# Patient Record
Sex: Female | Born: 1935 | Race: White | Hispanic: No | Smoking: Never smoker
Health system: Southern US, Community
[De-identification: ages and names within clinical notes are randomized; demographics above are authoritative.]

## PROBLEM LIST (undated history)

## (undated) DIAGNOSIS — E079 Disorder of thyroid, unspecified: Secondary | ICD-10-CM

## (undated) DIAGNOSIS — E7800 Pure hypercholesterolemia, unspecified: Secondary | ICD-10-CM

## (undated) DIAGNOSIS — I10 Essential (primary) hypertension: Secondary | ICD-10-CM

## (undated) DIAGNOSIS — E119 Type 2 diabetes mellitus without complications: Secondary | ICD-10-CM

## (undated) HISTORY — PX: CATARACT EXTRACTION: SUR2

---

## 2015-09-22 DIAGNOSIS — E119 Type 2 diabetes mellitus without complications: Secondary | ICD-10-CM | POA: Insufficient documentation

## 2015-09-22 DIAGNOSIS — I10 Essential (primary) hypertension: Secondary | ICD-10-CM | POA: Diagnosis present

## 2015-09-22 DIAGNOSIS — K219 Gastro-esophageal reflux disease without esophagitis: Secondary | ICD-10-CM | POA: Insufficient documentation

## 2015-09-22 DIAGNOSIS — E039 Hypothyroidism, unspecified: Secondary | ICD-10-CM | POA: Insufficient documentation

## 2015-09-22 DIAGNOSIS — E782 Mixed hyperlipidemia: Secondary | ICD-10-CM | POA: Diagnosis present

## 2016-07-20 DIAGNOSIS — M792 Neuralgia and neuritis, unspecified: Secondary | ICD-10-CM | POA: Insufficient documentation

## 2016-11-20 DIAGNOSIS — H401131 Primary open-angle glaucoma, bilateral, mild stage: Secondary | ICD-10-CM | POA: Insufficient documentation

## 2016-11-29 DIAGNOSIS — H40053 Ocular hypertension, bilateral: Secondary | ICD-10-CM | POA: Insufficient documentation

## 2017-03-22 DIAGNOSIS — H524 Presbyopia: Secondary | ICD-10-CM | POA: Insufficient documentation

## 2017-03-22 DIAGNOSIS — Z961 Presence of intraocular lens: Secondary | ICD-10-CM | POA: Insufficient documentation

## 2017-11-23 DIAGNOSIS — M1612 Unilateral primary osteoarthritis, left hip: Secondary | ICD-10-CM | POA: Insufficient documentation

## 2018-06-20 DIAGNOSIS — R011 Cardiac murmur, unspecified: Secondary | ICD-10-CM | POA: Insufficient documentation

## 2019-01-12 ENCOUNTER — Inpatient Hospital Stay (HOSPITAL_BASED_OUTPATIENT_CLINIC_OR_DEPARTMENT_OTHER)
Admission: EM | Admit: 2019-01-12 | Discharge: 2019-02-11 | DRG: 335 | Disposition: A | Discharge status: Home Health | Payer: Medicare Other | Attending: Internal Medicine | Admitting: Internal Medicine

## 2019-01-12 ENCOUNTER — Encounter (HOSPITAL_BASED_OUTPATIENT_CLINIC_OR_DEPARTMENT_OTHER): Payer: Self-pay | Admitting: Emergency Medicine

## 2019-01-12 ENCOUNTER — Emergency Department (HOSPITAL_BASED_OUTPATIENT_CLINIC_OR_DEPARTMENT_OTHER): Payer: Medicare Other

## 2019-01-12 ENCOUNTER — Inpatient Hospital Stay (HOSPITAL_COMMUNITY): Payer: Medicare Other

## 2019-01-12 ENCOUNTER — Other Ambulatory Visit: Payer: Self-pay

## 2019-01-12 DIAGNOSIS — Z6823 Body mass index (BMI) 23.0-23.9, adult: Secondary | ICD-10-CM

## 2019-01-12 DIAGNOSIS — E43 Unspecified severe protein-calorie malnutrition: Secondary | ICD-10-CM | POA: Diagnosis present

## 2019-01-12 DIAGNOSIS — K560 Paralytic ileus: Secondary | ICD-10-CM | POA: Diagnosis present

## 2019-01-12 DIAGNOSIS — E039 Hypothyroidism, unspecified: Secondary | ICD-10-CM | POA: Diagnosis present

## 2019-01-12 DIAGNOSIS — K449 Diaphragmatic hernia without obstruction or gangrene: Secondary | ICD-10-CM | POA: Diagnosis present

## 2019-01-12 DIAGNOSIS — E785 Hyperlipidemia, unspecified: Secondary | ICD-10-CM | POA: Diagnosis not present

## 2019-01-12 DIAGNOSIS — I10 Essential (primary) hypertension: Secondary | ICD-10-CM | POA: Diagnosis present

## 2019-01-12 DIAGNOSIS — Z0189 Encounter for other specified special examinations: Secondary | ICD-10-CM

## 2019-01-12 DIAGNOSIS — Z978 Presence of other specified devices: Secondary | ICD-10-CM

## 2019-01-12 DIAGNOSIS — R188 Other ascites: Secondary | ICD-10-CM | POA: Diagnosis present

## 2019-01-12 DIAGNOSIS — K559 Vascular disorder of intestine, unspecified: Secondary | ICD-10-CM | POA: Diagnosis present

## 2019-01-12 DIAGNOSIS — K660 Peritoneal adhesions (postprocedural) (postinfection): Secondary | ICD-10-CM | POA: Diagnosis present

## 2019-01-12 DIAGNOSIS — E876 Hypokalemia: Secondary | ICD-10-CM | POA: Diagnosis present

## 2019-01-12 DIAGNOSIS — E1165 Type 2 diabetes mellitus with hyperglycemia: Secondary | ICD-10-CM | POA: Diagnosis present

## 2019-01-12 DIAGNOSIS — K567 Ileus, unspecified: Secondary | ICD-10-CM

## 2019-01-12 DIAGNOSIS — E1169 Type 2 diabetes mellitus with other specified complication: Secondary | ICD-10-CM | POA: Diagnosis not present

## 2019-01-12 DIAGNOSIS — Z79890 Hormone replacement therapy (postmenopausal): Secondary | ICD-10-CM | POA: Diagnosis not present

## 2019-01-12 DIAGNOSIS — K9189 Other postprocedural complications and disorders of digestive system: Secondary | ICD-10-CM | POA: Diagnosis not present

## 2019-01-12 DIAGNOSIS — Z1159 Encounter for screening for other viral diseases: Secondary | ICD-10-CM

## 2019-01-12 DIAGNOSIS — K56609 Unspecified intestinal obstruction, unspecified as to partial versus complete obstruction: Secondary | ICD-10-CM | POA: Diagnosis not present

## 2019-01-12 DIAGNOSIS — R109 Unspecified abdominal pain: Secondary | ICD-10-CM

## 2019-01-12 DIAGNOSIS — E119 Type 2 diabetes mellitus without complications: Secondary | ICD-10-CM | POA: Insufficient documentation

## 2019-01-12 DIAGNOSIS — K460 Unspecified abdominal hernia with obstruction, without gangrene: Secondary | ICD-10-CM | POA: Diagnosis present

## 2019-01-12 DIAGNOSIS — K5730 Diverticulosis of large intestine without perforation or abscess without bleeding: Secondary | ICD-10-CM | POA: Diagnosis present

## 2019-01-12 DIAGNOSIS — K219 Gastro-esophageal reflux disease without esophagitis: Secondary | ICD-10-CM | POA: Diagnosis present

## 2019-01-12 DIAGNOSIS — R1084 Generalized abdominal pain: Secondary | ICD-10-CM | POA: Diagnosis present

## 2019-01-12 DIAGNOSIS — Z888 Allergy status to other drugs, medicaments and biological substances status: Secondary | ICD-10-CM | POA: Diagnosis not present

## 2019-01-12 DIAGNOSIS — R1110 Vomiting, unspecified: Secondary | ICD-10-CM

## 2019-01-12 DIAGNOSIS — H9190 Unspecified hearing loss, unspecified ear: Secondary | ICD-10-CM | POA: Diagnosis present

## 2019-01-12 DIAGNOSIS — E782 Mixed hyperlipidemia: Secondary | ICD-10-CM | POA: Diagnosis present

## 2019-01-12 DIAGNOSIS — Z96642 Presence of left artificial hip joint: Secondary | ICD-10-CM | POA: Diagnosis present

## 2019-01-12 HISTORY — DX: Essential (primary) hypertension: I10

## 2019-01-12 HISTORY — DX: Pure hypercholesterolemia, unspecified: E78.00

## 2019-01-12 HISTORY — DX: Unspecified intestinal obstruction, unspecified as to partial versus complete obstruction: K56.609

## 2019-01-12 HISTORY — DX: Type 2 diabetes mellitus without complications: E11.9

## 2019-01-12 HISTORY — DX: Disorder of thyroid, unspecified: E07.9

## 2019-01-12 LAB — COMPREHENSIVE METABOLIC PANEL
ALT: 16 U/L (ref 0–44)
AST: 21 U/L (ref 15–41)
Albumin: 4.4 g/dL (ref 3.5–5.0)
Alkaline Phosphatase: 61 U/L (ref 38–126)
Anion gap: 14 (ref 5–15)
BUN: 26 mg/dL — ABNORMAL HIGH (ref 8–23)
CO2: 26 mmol/L (ref 22–32)
Calcium: 9.6 mg/dL (ref 8.9–10.3)
Chloride: 95 mmol/L — ABNORMAL LOW (ref 98–111)
Creatinine, Ser: 1.09 mg/dL — ABNORMAL HIGH (ref 0.44–1.00)
GFR calc Af Amer: 52 mL/min — ABNORMAL LOW (ref >60)
GFR calc non Af Amer: 45 mL/min — ABNORMAL LOW (ref >60)
Glucose, Bld: 230 mg/dL — ABNORMAL HIGH (ref 70–99)
Potassium: 3.2 mmol/L — ABNORMAL LOW (ref 3.5–5.1)
Sodium: 135 mmol/L (ref 135–145)
Total Bilirubin: 0.7 mg/dL (ref 0.3–1.2)
Total Protein: 7.5 g/dL (ref 6.5–8.1)

## 2019-01-12 LAB — SARS CORONAVIRUS 2 BY RT PCR (HOSPITAL ORDER, PERFORMED IN ~~LOC~~ HOSPITAL LAB): SARS Coronavirus 2: NEGATIVE

## 2019-01-12 LAB — URINALYSIS, ROUTINE W REFLEX MICROSCOPIC
Bilirubin Urine: NEGATIVE
Glucose, UA: NEGATIVE mg/dL
Ketones, ur: 15 mg/dL — AB
Leukocytes,Ua: NEGATIVE
Nitrite: NEGATIVE
Protein, ur: NEGATIVE mg/dL
Specific Gravity, Urine: 1.02 (ref 1.005–1.030)
pH: 7.5 (ref 5.0–8.0)

## 2019-01-12 LAB — CBC WITH DIFFERENTIAL/PLATELET
Abs Immature Granulocytes: 0.04 10*3/uL (ref 0.00–0.07)
Basophils Absolute: 0 10*3/uL (ref 0.0–0.1)
Basophils Relative: 0 %
Eosinophils Absolute: 0 10*3/uL (ref 0.0–0.5)
Eosinophils Relative: 0 %
HCT: 37.7 % (ref 36.0–46.0)
Hemoglobin: 11.5 g/dL — ABNORMAL LOW (ref 12.0–15.0)
Immature Granulocytes: 0 %
Lymphocytes Relative: 11 %
Lymphs Abs: 1.1 10*3/uL (ref 0.7–4.0)
MCH: 25.2 pg — ABNORMAL LOW (ref 26.0–34.0)
MCHC: 30.5 g/dL (ref 30.0–36.0)
MCV: 82.7 fL (ref 80.0–100.0)
Monocytes Absolute: 0.3 10*3/uL (ref 0.1–1.0)
Monocytes Relative: 4 %
Neutro Abs: 8.1 10*3/uL — ABNORMAL HIGH (ref 1.7–7.7)
Neutrophils Relative %: 85 %
Platelets: 349 10*3/uL (ref 150–400)
RBC: 4.56 MIL/uL (ref 3.87–5.11)
RDW: 15.1 % (ref 11.5–15.5)
WBC: 9.6 10*3/uL (ref 4.0–10.5)
nRBC: 0 % (ref 0.0–0.2)

## 2019-01-12 LAB — SARS CORONAVIRUS 2 AG (30 MIN TAT): SARS Coronavirus 2 Ag: NEGATIVE

## 2019-01-12 LAB — URINALYSIS, MICROSCOPIC (REFLEX)

## 2019-01-12 LAB — LIPASE, BLOOD: Lipase: 29 U/L (ref 11–51)

## 2019-01-12 LAB — TROPONIN I: Troponin I: <0.03 ng/mL (ref <0.03)

## 2019-01-12 MED ORDER — FENTANYL CITRATE (PF) 100 MCG/2ML IJ SOLN
25.0000 ug | INTRAMUSCULAR | Status: DC | PRN
  Administered 2019-01-12 – 2019-01-13 (×2): 25 ug via INTRAVENOUS
  Filled 2019-01-12 (×2): qty 2

## 2019-01-12 MED ORDER — HEPARIN SODIUM (PORCINE) 5000 UNIT/ML IJ SOLN
5000.0000 [IU] | Freq: Three times a day (TID) | INTRAMUSCULAR | Status: DC
  Administered 2019-01-12 – 2019-01-14 (×5): 5000 [IU] via SUBCUTANEOUS
  Filled 2019-01-12 (×4): qty 1

## 2019-01-12 MED ORDER — HYDRALAZINE HCL 20 MG/ML IJ SOLN: 10.0000 mg | Freq: Four times a day (QID) | INTRAMUSCULAR | Status: DC | PRN

## 2019-01-12 MED ORDER — MORPHINE SULFATE (PF) 2 MG/ML IV SOLN: 2.0000 mg | INTRAVENOUS | Status: DC | PRN

## 2019-01-12 MED ORDER — ONDANSETRON HCL 4 MG/2ML IJ SOLN
4.0000 mg | Freq: Once | INTRAMUSCULAR | Status: AC
  Administered 2019-01-12: 10:00:00 4 mg via INTRAVENOUS
  Filled 2019-01-12: qty 2

## 2019-01-12 MED ORDER — MORPHINE SULFATE (PF) 4 MG/ML IV SOLN
INTRAVENOUS | Status: AC
  Filled 2019-01-12: qty 1

## 2019-01-12 MED ORDER — SODIUM CHLORIDE 0.9 % IV BOLUS
500.0000 mL | Freq: Once | INTRAVENOUS | Status: AC
  Administered 2019-01-12: 500 mL via INTRAVENOUS

## 2019-01-12 MED ORDER — ONDANSETRON HCL 4 MG/2ML IJ SOLN: 4.0000 mg | Freq: Four times a day (QID) | INTRAMUSCULAR | Status: DC | PRN

## 2019-01-12 MED ORDER — IOHEXOL 300 MG/ML  SOLN
100.0000 mL | Freq: Once | INTRAMUSCULAR | Status: AC | PRN
  Administered 2019-01-12: 100 mL via INTRAVENOUS

## 2019-01-12 MED ORDER — DIATRIZOATE MEGLUMINE & SODIUM 66-10 % PO SOLN
90.0000 mL | Freq: Once | ORAL | Status: AC
  Administered 2019-01-12: 90 mL via NASOGASTRIC
  Filled 2019-01-12: qty 90

## 2019-01-12 MED ORDER — ONDANSETRON HCL 4 MG PO TABS: 4.0000 mg | ORAL_TABLET | Freq: Four times a day (QID) | ORAL | Status: DC | PRN

## 2019-01-12 MED ORDER — MORPHINE SULFATE (PF) 4 MG/ML IV SOLN
4.0000 mg | Freq: Once | INTRAVENOUS | Status: AC
  Administered 2019-01-12: 4 mg via INTRAVENOUS
  Filled 2019-01-12: qty 1

## 2019-01-12 MED ORDER — LIDOCAINE VISCOUS HCL 2 % MT SOLN
OROMUCOSAL | Status: AC
  Administered 2019-01-12: 14:00:00
  Filled 2019-01-12: qty 15

## 2019-01-12 MED ORDER — PANTOPRAZOLE SODIUM 40 MG IV SOLR
40.0000 mg | INTRAVENOUS | Status: DC
  Administered 2019-01-12 – 2019-01-20 (×9): 40 mg via INTRAVENOUS
  Filled 2019-01-12 (×9): qty 40

## 2019-01-12 MED ORDER — PHENOL 1.4 % MT LIQD
1.0000 | OROMUCOSAL | Status: DC | PRN
  Administered 2019-01-12: 1 via OROMUCOSAL
  Filled 2019-01-12: qty 177

## 2019-01-12 MED ORDER — DEXTROSE-NACL 5-0.45 % IV SOLN
INTRAVENOUS | Status: DC
  Administered 2019-01-12 – 2019-01-13 (×2): via INTRAVENOUS

## 2019-01-12 MED ORDER — MORPHINE SULFATE (PF) 4 MG/ML IV SOLN
4.0000 mg | Freq: Once | INTRAVENOUS | Status: AC
  Administered 2019-01-12: 4 mg via INTRAVENOUS

## 2019-01-12 MED ORDER — LEVOTHYROXINE SODIUM 100 MCG/5ML IV SOLN
25.0000 ug | Freq: Every day | INTRAVENOUS | Status: DC
  Administered 2019-01-12 – 2019-01-21 (×10): 25 ug via INTRAVENOUS
  Filled 2019-01-12 (×10): qty 5

## 2019-01-12 NOTE — H&P (Signed)
History and Physical        Hospital Admission Note Date: 01/12/2019  Patient name: Christie Hinton Medical record number: [id number] Date of birth: 01/31/1928 Age: 85 y.o. Gender: female  PCP: Sharren BridgeSellers, Billie H, NP    Patient coming from: Transferred [facility] [place]  I have reviewed all records in the [facility].    Chief Complaint:  Abdominal pain with nausea and vomiting  HPI: Patient is 85 year old female with history of hypertension, hyperlipidemia, GERD [facility] [place] with complaints of generalized abdominal pain that started after dinner last night, associated with vomiting.  Patient reported that at bedtime, patient started noticing lower abdominal pain, which progressively got worse, 8/10, sharp and intermittent with nausea and vomiting since midnight.  She denied any diarrhea.  Last BM was 2 days ago. No fevers, chills, chest pain or shortness of breath. [facility]19 [facility] at [facility] [place] negative  ED work-up/course:  Temp 97.7, pulse 76, BP 166/82, O2 sats 99% on room air At [facility] [place], patient desatted to 77% on room air after receiving IV morphine.  She was placed on 2 L O2 nasal cannula  CT abdomen pelvis showed small bowel obstruction.  Dilated loops of small bowel in the left abdomen with mesenteric edema and well-defined transition point.  Findings concerning for closed-loop obstruction possibly related to an internal hernia.  Colonic diverticulosis without acute colonic inflammation.  Review of Systems: Positives marked in 'bold' Constitutional: Denies fever, chills, diaphoresis, poor appetite and fatigue.  HEENT: Denies photophobia, eye pain, redness, hearing loss, ear pain, congestion, sore throat, rhinorrhea, sneezing, mouth sores, trouble swallowing, neck pain, neck stiffness and tinnitus.    Respiratory: Denies SOB, DOE, cough, chest tightness,  and wheezing.   Cardiovascular: Denies chest pain, palpitations and leg swelling.  Gastrointestinal: Please see HPI Genitourinary: Denies dysuria, urgency, frequency, hematuria, flank pain and difficulty urinating.  Musculoskeletal: Denies myalgias, back pain, joint swelling, arthralgias and gait problem.  Skin: Denies pallor, rash and wound.  Neurological: Denies dizziness, seizures, syncope, weakness, light-headedness, numbness and headaches.  Hematological: Denies adenopathy. Easy bruising, personal or family bleeding history  Psychiatric/Behavioral: Denies suicidal ideation, mood changes, confusion, nervousness, sleep disturbance and agitation  Past Medical History: Past Medical History:  Diagnosis Date  . Diabetes mellitus without complication (HCC)   . High cholesterol   . Hypertension   . Thyroid disease     Past Surgical History:  Procedure Laterality Date  . CATARACT EXTRACTION      Medications: Prior to Admission medications   Medication Sig Start Date End Date Taking? Authorizing Provider  atorvastatin (LIPITOR) 40 MG tablet Take 40 mg by mouth daily.   Yes [provider]  levothyroxine (SYNTHROID) 125 MCG tablet Take 50 mcg by mouth daily before breakfast.    Yes [provider]  losartan-hydrochlorothiazide (HYZAAR) 100-25 MG tablet Take 1 tablet by mouth daily.   Yes [provider]  omeprazole (PRILOSEC) 40 MG capsule Take 40 mg by mouth daily.   Yes [provider]    Allergies:  No Known Allergies  Social History:  reports that she has never smoked. She  has never used smokeless tobacco. No history on file for alcohol and drug.  Family History: Denies any pertinent family history of small bowel obstruction or cancer  Physical Exam: Blood pressure 120/71, pulse 79, temperature 97.7 F (36.5 C), temperature source Oral, resp. rate 17, height 5' 6.5" (1.689 m), weight  67.6 kg, SpO2 96 %. General: Alert, awake, oriented x3, in no acute distress. Eyes: pink conjunctiva,anicteric sclera, pupils equal and reactive to light and accomodation, HEENT: normocephalic, atraumatic, oropharynx clear Neck: supple, no masses or lymphadenopathy, no goiter, no bruits, no JVD CVS: Regular rate and rhythm, without murmurs, rubs or gallops. No lower extremity edema Resp : Clear to auscultation bilaterally, no wheezing, rales or rhonchi. GI : Soft, generalized abdominal TTP, hypoactive bowel sounds, no rebound or guarding Musculoskeletal: No clubbing or cyanosis, positive pedal pulses. No contracture. ROM intact  Neuro: Grossly intact, no focal neurological deficits, strength 5/5 upper and lower extremities bilaterally Psych: alert and oriented x 3, normal mood and affect Skin: no rashes or lesions, warm and dry   LABS on Admission: I have personally reviewed all the labs and imagings below    Basic Metabolic Panel: Recent Labs  Lab 01/12/19 0928  NA 135  K 3.2*  CL 95*  CO2 26  GLUCOSE 230*  BUN 26*  CREATININE 1.09*  CALCIUM 9.6   Liver Function Tests: Recent Labs  Lab 01/12/19 0928  AST 21  ALT 16  ALKPHOS 61  BILITOT 0.7  PROT 7.5  ALBUMIN 4.4   Recent Labs  Lab 01/12/19 0928  LIPASE 29   No results for input(s): AMMONIA in the last 168 hours. CBC: Recent Labs  Lab 01/12/19 0928  WBC 9.6  NEUTROABS 8.1*  HGB 11.5*  HCT 37.7  MCV 82.7  PLT 349   Cardiac Enzymes: Recent Labs  Lab 01/12/19 0928  TROPONINI <0.03   BNP: Invalid input(s): POCBNP CBG: No results for input(s): GLUCAP in the last 168 hours.  Radiological Exams on Admission:  Ct Abdomen Pelvis W Contrast  Result Date: 01/12/2019 CLINICAL DATA:  85 year old with bowel obstruction. Acute lower abdominal pain with nausea. EXAM: CT ABDOMEN AND PELVIS WITH CONTRAST TECHNIQUE: Multidetector CT imaging of the abdomen and pelvis was performed using the standard protocol  following bolus administration of intravenous contrast. CONTRAST:  132mL OMNIPAQUE IOHEXOL 300 MG/ML  SOLN COMPARISON:  None. FINDINGS: Lower chest: Lung bases are clear.  No pleural effusions. Hepatobiliary: Normal appearance of the liver. Small hypodensity in the right hepatic lobe on image 19 is probably an incidental finding. Portal venous system is patent. Normal appearance of the gallbladder without inflammatory changes. No biliary dilatation. Pancreas: Unremarkable. No pancreatic ductal dilatation or surrounding inflammatory changes. Spleen: Normal in size without focal abnormality. Adrenals/Urinary Tract: Normal adrenal glands. Urinary bladder is unremarkable but slightly limited evaluation due to artifact from left hip replacement. Normal appearance of both kidneys without hydronephrosis. Stomach/Bowel: Extensive diverticulosis in the sigmoid colon and left colon without acute colonic inflammation. Normal appendix. Moderate sized hiatal hernia. Normal appearance of the duodenum. Dilated small bowel loops in the left abdomen compatible with jejunum. There is mesenteric edema associated with these dilated small bowel loops. Small bowel loops measure up to 3.3 cm. Distal small bowel is decompressed. Small bowel transition point in the left abdomen near the mesenteric edema. Transition point best seen on the coronal reformats, sequence 5 image 42 and sagittal reformats, sequence 6, image 84. Irregularity of the mesentery and fat near the bowel obstruction and  raises concern for a closed loop obstruction and possibly an internal hernia. No evidence for pneumatosis or portal air. Vascular/Lymphatic: Atherosclerotic disease in the abdominal aorta without aneurysm. No abdominopelvic lymphadenopathy. Reproductive: Uterus and bilateral adnexa are unremarkable. Other: Small amount of free fluid in the pelvis. Minimal fluid in the left paracolic gutter. Mesenteric edema in the left abdomen associated with the dilated  small bowel loops. Negative for free air. Musculoskeletal: No acute bone abnormality in the spine. Extensive facet arthropathy. IMPRESSION: 1. Small bowel obstruction. Dilated loops of small bowel in the left abdomen with mesenteric edema and well-defined transition point. Findings raise concern for a closed loop obstruction, possibly related to an internal hernia. Small amount of fluid in the abdomen and pelvis. 2. Colonic diverticulosis without acute colonic inflammation. 3. Moderate sized hiatal hernia. These results were called by telephone at the time of interpretation on 01/12/2019 at 12:08 pm to Dr. Alvira MondayERIN SCHLOSSMAN , who verbally acknowledged these results. Electronically Signed   By: Richarda OverlieAdam  Henn M.D.   On: 01/12/2019 12:16      EKG: Independently reviewed.  No new EKG available   Assessment/Plan Principal Problem:   SBO (small bowel obstruction) (HCC): Per patient no prior history of small bowel obstruction -Admit to MedSurg, placed on n.p.o. status, IV fluids, pain control -Will avoid morphine as patient had respiratory depression and hypoxia at the [facility] [place].  Placed on low-dose fentanyl IV as needed. -NG-tube to intermittent wall suction-placed -General surgery consulted, Dr. Magnus IvanBlackman to evaluate   Active Problems:   HTN (hypertension) -Hold losartan HCTZ -Patient is n.p.o., placed on IV hydralazine with parameters    Hyperlipidemia -Currently n.p.o., hold Lipitor   Hypothyroidism -Converted to IV Synthroid 25 MCG, pharmacy to verify outpatient dose   DVT prophylaxis: Heparin subcu  CODE STATUS: Full CODE STATUS, discussed with the patient  Consults called: General surgery, discussed with Dr. Magnus IvanBlackman  Family Communication: Admission, patients condition and plan of care including tests being ordered have been discussed with the patient, who indicates understanding and agree with the plan and Code Status  Admission status: Patient, MedSurg  Disposition  plan: Further plan will depend as patient's clinical course evolves and further radiologic and laboratory data become available.    At the time of admission, it appears that the appropriate admission status for this patient is INPATIENT . This is judged to be reasonable and necessary in order to provide the required intensity of service to ensure the patient's safety given the presenting symptoms, physical exam findings, and initial radiographic and laboratory data in the context of their chronic comorbidities.  The medical decision making on this patient was of high complexity and the patient is at high risk for clinical deterioration, therefore this is a level 3 visit.   Time Spent on Admission: 60 minutes    Hadiyah Maricle M.D. [facility] 01/12/2019, 4:41 PM

## 2019-01-12 NOTE — Consult Note (Signed)
Reason for Consult:SBO Referring Physician: Dr. Thad Rangeripudeep Rai  [name] is an 85 y.o. female.  HPI: This is a very pleasant 85 year old female [facility] [place] with a small bowel obstruction.  She started having abdominal pain last evening after dinner.  She then started having nausea with emesis.  She described this pain is moderate to severe with cramping.  Her last bowel movement was 2 days ago.  She denies diarrhea.  She now has a nasogastric tube in place and reports much less discomfort now.  She has not passed flatus today.  She has no previous surgical history.  She had undergone a CT scan of the abdomen and pelvis suggesting a closed-loop small bowel obstruction.  She is otherwise without complaints.  She has no fevers or chills.  She denies dysuria.  Past Medical History:  Diagnosis Date  . Diabetes mellitus without complication (HCC)   . High cholesterol   . Hypertension   . Thyroid disease     Past Surgical History:  Procedure Laterality Date  . CATARACT EXTRACTION      No family history on file.  Social History:  reports that she has never smoked. She has never used smokeless tobacco. No history on file for alcohol and drug.  Allergies: No Known Allergies  Medications: I have reviewed the patient's current medications.  Results for orders placed or performed during the hospital encounter of 01/12/19 (from the past 48 hour(s))  CBC with Differential     Status: Abnormal   Collection Time: 01/12/19  9:28 AM  Result Value Ref Range   WBC 9.6 4.0 - 10.5 K/uL   RBC 4.56 3.87 - 5.11 MIL/uL   Hemoglobin 11.5 (L) 12.0 - 15.0 g/dL   HCT 16.137.7 09.636.0 - 04.546.0 %   MCV 82.7 80.0 - 100.0 fL   MCH 25.2 (L) 26.0 - 34.0 pg   MCHC 30.5 30.0 - 36.0 g/dL   RDW 40.915.1 81.111.5 - 91.415.5 %   Platelets 349 150 - 400 K/uL   nRBC 0.0 0.0 - 0.2 %   Neutrophils Relative % 85 %   Neutro Abs 8.1 (H) 1.7 - 7.7 K/uL   Lymphocytes Relative 11 %   Lymphs Abs 1.1 0.7 - 4.0 K/uL    Monocytes Relative 4 %   Monocytes Absolute 0.3 0.1 - 1.0 K/uL   Eosinophils Relative 0 %   Eosinophils Absolute 0.0 0.0 - 0.5 K/uL   Basophils Relative 0 %   Basophils Absolute 0.0 0.0 - 0.1 K/uL   Immature Granulocytes 0 %   Abs Immature Granulocytes 0.04 0.00 - 0.07 K/uL    Comment: Performed at [facility] [place], [address] [place], [place] [address]  Comprehensive metabolic panel     Status: Abnormal   Collection Time: 01/12/19  9:28 AM  Result Value Ref Range   Sodium 135 135 - 145 mmol/L   Potassium 3.2 (L) 3.5 - 5.1 mmol/L   Chloride 95 (L) 98 - 111 mmol/L   CO2 26 22 - 32 mmol/L   Glucose, Bld 230 (H) 70 - 99 mg/dL   BUN 26 (H) 8 - 23 mg/dL   Creatinine, Ser 6.211.09 (H) 0.44 - 1.00 mg/dL   Calcium 9.6 8.9 - 30.810.3 mg/dL   Total Protein 7.5 6.5 - 8.1 g/dL   Albumin 4.4 3.5 - 5.0 g/dL   AST 21 15 - 41 U/L   ALT 16 0 - 44 U/L   Alkaline Phosphatase 61  38 - 126 U/L   Total Bilirubin 0.7 0.3 - 1.2 mg/dL   GFR calc non Af Amer 45 (L) >60 mL/min   GFR calc Af Amer 52 (L) >60 mL/min   Anion gap 14 5 - 15    Comment: Performed at [facility] [place], [address] [place], [place] [address]  Lipase, blood     Status: None   Collection Time: 01/12/19  9:28 AM  Result Value Ref Range   Lipase 29 11 - 51 U/L    Comment: Performed at [facility] [place], [address] [place], [place] [address]  Troponin I - ONCE - STAT     Status: None   Collection Time: 01/12/19  9:28 AM  Result Value Ref Range   Troponin I <0.03 <0.03 ng/mL    Comment: Performed at [facility] [place], [address] [place], [place] [address]  Urinalysis, Routine w reflex microscopic     Status: Abnormal   Collection Time: 01/12/19  9:31 AM  Result Value Ref Range   Color, Urine YELLOW YELLOW   APPearance HAZY (A) CLEAR   Specific Gravity, Urine 1.020 1.005 - 1.030   pH 7.5 5.0 - 8.0   Glucose, UA NEGATIVE NEGATIVE mg/dL   Hgb urine dipstick TRACE (A) NEGATIVE   Bilirubin Urine  NEGATIVE NEGATIVE   Ketones, ur 15 (A) NEGATIVE mg/dL   Protein, ur NEGATIVE NEGATIVE mg/dL   Nitrite NEGATIVE NEGATIVE   Leukocytes,Ua NEGATIVE NEGATIVE    Comment: Performed at [facility] [place], [address] [place], [place] [address]  Urinalysis, Microscopic (reflex)     Status: Abnormal   Collection Time: 01/12/19  9:31 AM  Result Value Ref Range   RBC / HPF 11-20 0 - 5 RBC/hpf   WBC, UA 6-10 0 - 5 WBC/hpf   Bacteria, UA MANY (A) NONE SEEN   Squamous Epithelial / LPF 0-5 0 - 5   Mucus PRESENT     Comment: Performed at [facility] [place], [address] [place], [place] [address]  SARS Coronavirus 2 (Hosp order,Performed in [facility] lab via [employer])     Status: None   Collection Time: 01/12/19 11:21 AM  Result Value Ref Range   SARS Coronavirus 2 ([employer] Now) NEGATIVE NEGATIVE    Comment: (NOTE) Interpretive Result Comment(s): COVID 19 Positive SARS CoV 2 target nucleic acids are DETECTED. The SARS CoV 2 RNA is generally detectable in upper and lower respiratory specimens during the acute phase of infection.  Positive results are indicative of active infection with SARS CoV 2.  Clinical correlation with patient history and other diagnostic information is necessary to determine patient infection status.  Positive results do not rule out bacterial infection or coinfection with other viruses. The expected result is Negative. COVID 19 Negative SARS CoV 2 target nucleic acids are NOT DETECTED. The SARS CoV 2 RNA is generally detectable in upper and lower respiratory specimens during the acute phase of infection.  Negative results do not preclude SARS CoV 2 infection, do not rule out coinfections with other pathogens, and should not be used as the sole basis for treatment or other patient management decisions.  Negative results must be combined with clinical  observations, patient history, and epidemiological information. The expected result is  Negative. Invalid Presence or absence of SARS CoV 2 nucleic acids cannot be determined. Repeat testing was performed on the submitted specimen and repeated Invalid results  were obtained.  If clinically indicated, additional testing on a new specimen with an alternate test methodology [id number]) is advised.  The SARS CoV 2 RNA is generally detectable in upper and lower respiratory specimens during the acute phase of infection. The expected result is Negative. Fact Sheet for Patients:  http://www.graves-ford.org/https://www.fda.gov/media/136524/download Fact Sheet for Healthcare Providers: EnviroConcern.sihttps://www.fda.gov/media/136523/download This test is not yet approved or cleared by the [place] FDA and has been authorized for detection and/or diagnosis of SARS CoV 2 by FDA under an Emergency Use Authorization (EUA).  This EUA will remain in effect (meaning this test can be used) for the duration of the COVID19 d eclaration under Section 564(b)(1) of the Act, 21 U.S.C. section 8544251512360bbb 3(b)(1), unless the authorization is terminated or revoked sooner. Performed at [facility] [place], [address] [place], [place] [address]     Ct Abdomen Pelvis W Contrast  Result Date: 01/12/2019 CLINICAL DATA:  85 year old with bowel obstruction. Acute lower abdominal pain with nausea. EXAM: CT ABDOMEN AND PELVIS WITH CONTRAST TECHNIQUE: Multidetector CT imaging of the abdomen and pelvis was performed using the standard protocol following bolus administration of intravenous contrast. CONTRAST:  100mL OMNIPAQUE IOHEXOL 300 MG/ML  SOLN COMPARISON:  None. FINDINGS: Lower chest: Lung bases are clear.  No pleural effusions. Hepatobiliary: Normal appearance of the liver. Small hypodensity in the right hepatic lobe on image 19 is probably an incidental finding. Portal venous system is patent. Normal appearance of the gallbladder without inflammatory changes. No biliary dilatation. Pancreas: Unremarkable. No pancreatic ductal dilatation or  surrounding inflammatory changes. Spleen: Normal in size without focal abnormality. Adrenals/Urinary Tract: Normal adrenal glands. Urinary bladder is unremarkable but slightly limited evaluation due to artifact from left hip replacement. Normal appearance of both kidneys without hydronephrosis. Stomach/Bowel: Extensive diverticulosis in the sigmoid colon and left colon without acute colonic inflammation. Normal appendix. Moderate sized hiatal hernia. Normal appearance of the duodenum. Dilated small bowel loops in the left abdomen compatible with jejunum. There is mesenteric edema associated with these dilated small bowel loops. Small bowel loops measure up to 3.3 cm. Distal small bowel is decompressed. Small bowel transition point in the left abdomen near the mesenteric edema. Transition point best seen on the coronal reformats, sequence 5 image 42 and sagittal reformats, sequence 6, image 84. Irregularity of the mesentery and fat near the bowel obstruction and raises concern for a closed loop obstruction and possibly an internal hernia. No evidence for pneumatosis or portal air. Vascular/Lymphatic: Atherosclerotic disease in the abdominal aorta without aneurysm. No abdominopelvic lymphadenopathy. Reproductive: Uterus and bilateral adnexa are unremarkable. Other: Small amount of free fluid in the pelvis. Minimal fluid in the left paracolic gutter. Mesenteric edema in the left abdomen associated with the dilated small bowel loops. Negative for free air. Musculoskeletal: No acute bone abnormality in the spine. Extensive facet arthropathy. IMPRESSION: 1. Small bowel obstruction. Dilated loops of small bowel in the left abdomen with mesenteric edema and well-defined transition point. Findings raise concern for a closed loop obstruction, possibly related to an internal hernia. Small amount of fluid in the abdomen and pelvis. 2. Colonic diverticulosis without acute colonic inflammation. 3. Moderate sized hiatal hernia.  These results were called by telephone at the time of interpretation on 01/12/2019 at 12:08 pm to Dr. Alvira MondayERIN SCHLOSSMAN , who verbally acknowledged these results. Electronically Signed   By: Richarda OverlieAdam  Henn M.D.   On: 01/12/2019 12:16    Review of Systems  Constitutional: Negative for chills and fever.  Respiratory:  Negative for cough, sputum production and shortness of breath.   Cardiovascular: Negative for chest pain.  Gastrointestinal: Positive for abdominal pain, nausea and vomiting. Negative for constipation and diarrhea.  Genitourinary: Negative for dysuria and urgency.  All other systems reviewed and are negative.  Blood pressure 128/74, pulse 81, temperature (!) 97.5 F (36.4 C), temperature source Oral, resp. rate 12, height 5' 6.5" (1.689 m), weight 67.6 kg, SpO2 94 %. Physical Exam  Constitutional: She is oriented to person, place, and time. She appears well-developed and well-nourished. No distress.  HENT:  Head: Normocephalic and atraumatic.  Right Ear: External ear normal.  Left Ear: External ear normal.  Nose: Nose normal.  Mouth/Throat: Oropharynx is clear and moist. No oropharyngeal exudate.  Eyes: Pupils are equal, round, and reactive to light. Right eye exhibits no discharge. Left eye exhibits no discharge. No scleral icterus.  Neck: Normal range of motion. Neck supple. No tracheal deviation present.  Cardiovascular: Normal rate, regular rhythm, normal heart sounds and intact distal pulses.  No murmur heard. Respiratory: Effort normal and breath sounds normal. No respiratory distress. She has no wheezes.  GI: Soft. She exhibits no distension.  There is minimal tenderness in the left side of the abdomen.  There is minimal guarding.  There is no frank peritonitis. There are no hernias.  Musculoskeletal: Normal range of motion.        General: No tenderness, deformity or edema.  Lymphadenopathy:    She has no cervical adenopathy.  Neurological: She is alert and oriented to  person, place, and time.  Skin: Skin is warm and dry. She is not diaphoretic. No erythema. No pallor.  Psychiatric: Her behavior is normal. Judgment normal.    Assessment/Plan: Small bowel obstruction  Her CT scan is worrisome for a closed-loop small bowel obstruction but clinically, she is feeling better with decompression.  She has no prior surgical history so adhesive disease is rare in the circumstance.  I cannot completely rule out that this is malignant in nature.  She currently does not have peritonitis and her abdomen is surprisingly soft with only mild tenderness.  I discussed proceeding to the operating room with her for surgery versus an attempted conservative management with nasogastric suctioning.  Given her exam and normal white blood count, I believe is reasonable to continue bowel rest with nasogastric suctioning.  I will order our small bowel protocol as well.  If this is positive, she would eventually require laparotomy or if her clinical condition changes.  I will try to call her son.  She is in agreement with the plan.  We will follow her closely.  Coralie Keens 01/12/2019, 5:26 PM

## 2019-01-12 NOTE — ED Provider Notes (Signed)
[facility] EMERGENCY DEPARTMENT Provider Note   CSN: [id number] Arrival date & time: 01/12/19  6789    History   Chief Complaint Chief Complaint  Patient presents with  . Abdominal Pain    HPI Christie Hinton is a 85 y.o. female.     HPI   Last night woke up with pain across the stomach. Had severe nausea, a few episodes of vomiting. No diarrhea. Has chronic constipation.  Is passing flatus. No urinary symptoms. No fever.  No known sick contacts. No cough, dyspnea or cp. Not had this before. 8-9/10   Past Medical History:  Diagnosis Date  . Diabetes mellitus without complication ([place])   . High cholesterol   . Hypertension   . Thyroid disease     Patient Active Problem List   Diagnosis Date Noted  . SBO (small bowel obstruction) ([place]) 01/12/2019  . HTN (hypertension) 01/12/2019  . Hyperlipidemia 01/12/2019    Past Surgical History:  Procedure Laterality Date  . CATARACT EXTRACTION       OB History   No obstetric history on file.      Home Medications    Prior to Admission medications   Medication Sig Start Date End Date Taking? Authorizing Provider  atorvastatin (LIPITOR) 20 MG tablet Take 20 mg by mouth daily. 11/14/18   [provider]  latanoprost (XALATAN) 0.005 % ophthalmic solution Place 1 drop into both eyes at bedtime. 11/11/18   [provider]  levothyroxine (SYNTHROID) 50 MCG tablet Take 50 mcg by mouth daily. 11/14/18   [provider]  losartan-hydrochlorothiazide (HYZAAR) 100-25 MG tablet Take 1 tablet by mouth daily.    [provider]  omeprazole (PRILOSEC) 40 MG capsule Take 40 mg by mouth daily.    [provider]    Family History No family history on file.  Social History Social History   Tobacco Use  . Smoking status: Never Smoker  . Smokeless tobacco: Never Used  Substance Use Topics  . Alcohol use: Not on file  . Drug use: Not on file     Allergies   Piroxicam   Review  of Systems Review of Systems  Constitutional: Negative for fever.  HENT: Negative for sore throat.   Eyes: Negative for visual disturbance.  Respiratory: Negative for cough and shortness of breath.   Cardiovascular: Negative for chest pain.  Gastrointestinal: Positive for abdominal pain, constipation, nausea and vomiting. Negative for diarrhea.  Genitourinary: Negative for difficulty urinating and dysuria.  Musculoskeletal: Negative for back pain and neck pain.  Skin: Negative for rash.  Neurological: Negative for syncope and headaches.     Physical Exam Updated Vital Signs BP 136/73 (BP Location: Right Arm)   Pulse 80   Temp 98 F (36.7 C) (Oral)   Resp 15   Ht 5' 6.5" (1.689 m)   Wt 67.6 kg   SpO2 94%   BMI 23.69 kg/m   Physical Exam Vitals signs and nursing note reviewed.  Constitutional:      General: She is not in acute distress.    Appearance: She is well-developed. She is not diaphoretic.  HENT:     Head: Normocephalic and atraumatic.  Eyes:     Conjunctiva/sclera: Conjunctivae normal.  Neck:     Musculoskeletal: Normal range of motion.  Cardiovascular:     Rate and Rhythm: Normal rate and regular rhythm.     Heart sounds: Normal heart sounds. No murmur. No friction rub. No gallop.   Pulmonary:  Effort: Pulmonary effort is normal. No respiratory distress.     Breath sounds: Normal breath sounds. No wheezing or rales.  Abdominal:     General: There is no distension.     Palpations: Abdomen is soft.     Tenderness: There is generalized abdominal tenderness. There is no guarding.  Musculoskeletal:        General: No tenderness.  Skin:    General: Skin is warm and dry.     Findings: No erythema or rash.  Neurological:     Mental Status: She is alert and oriented to person, place, and time.      ED Treatments / Results  Labs (all labs ordered are listed, but only abnormal results are displayed) Labs Reviewed  CBC WITH DIFFERENTIAL/PLATELET -  Abnormal; Notable for the following components:      Result Value   Hemoglobin 11.5 (*)    MCH 25.2 (*)    Neutro Abs 8.1 (*)    All other components within normal limits  COMPREHENSIVE METABOLIC PANEL - Abnormal; Notable for the following components:   Potassium 3.2 (*)    Chloride 95 (*)    Glucose, Bld 230 (*)    BUN 26 (*)    Creatinine, Ser 1.09 (*)    GFR calc non Af Amer 45 (*)    GFR calc Af Amer 52 (*)    All other components within normal limits  URINALYSIS, ROUTINE W REFLEX MICROSCOPIC - Abnormal; Notable for the following components:   APPearance HAZY (*)    Hgb urine dipstick TRACE (*)    Ketones, ur 15 (*)    All other components within normal limits  URINALYSIS, MICROSCOPIC (REFLEX) - Abnormal; Notable for the following components:   Bacteria, UA MANY (*)    All other components within normal limits  SARS CORONAVIRUS 2 (HOSP ORDER, PERFORMED IN [facility] VIA ABBOTT ID)  SARS CORONAVIRUS 2 (HOSPITAL ORDER, PERFORMED IN [facility])  URINE CULTURE  LIPASE, BLOOD  TROPONIN I  BASIC METABOLIC PANEL  CBC  TSH    EKG None  Radiology Ct Abdomen Pelvis W Contrast  Result Date: 01/12/2019 CLINICAL DATA:  85 year old with bowel obstruction. Acute lower abdominal pain with nausea. EXAM: CT ABDOMEN AND PELVIS WITH CONTRAST TECHNIQUE: Multidetector CT imaging of the abdomen and pelvis was performed using the standard protocol following bolus administration of intravenous contrast. CONTRAST:  100mL OMNIPAQUE IOHEXOL 300 MG/ML  SOLN COMPARISON:  None. FINDINGS: Lower chest: Lung bases are clear.  No pleural effusions. Hepatobiliary: Normal appearance of the liver. Small hypodensity in the right hepatic lobe on image 19 is probably an incidental finding. Portal venous system is patent. Normal appearance of the gallbladder without inflammatory changes. No biliary dilatation. Pancreas: Unremarkable. No pancreatic ductal dilatation or surrounding inflammatory  changes. Spleen: Normal in size without focal abnormality. Adrenals/Urinary Tract: Normal adrenal glands. Urinary bladder is unremarkable but slightly limited evaluation due to artifact from left hip replacement. Normal appearance of both kidneys without hydronephrosis. Stomach/Bowel: Extensive diverticulosis in the sigmoid colon and left colon without acute colonic inflammation. Normal appendix. Moderate sized hiatal hernia. Normal appearance of the duodenum. Dilated small bowel loops in the left abdomen compatible with jejunum. There is mesenteric edema associated with these dilated small bowel loops. Small bowel loops measure up to 3.3 cm. Distal small bowel is decompressed. Small bowel transition point in the left abdomen near the mesenteric edema. Transition point best seen on the coronal reformats, sequence 5 image  42 and sagittal reformats, sequence 6, image 84. Irregularity of the mesentery and fat near the bowel obstruction and raises concern for a closed loop obstruction and possibly an internal hernia. No evidence for pneumatosis or portal air. Vascular/Lymphatic: Atherosclerotic disease in the abdominal aorta without aneurysm. No abdominopelvic lymphadenopathy. Reproductive: Uterus and bilateral adnexa are unremarkable. Other: Small amount of free fluid in the pelvis. Minimal fluid in the left paracolic gutter. Mesenteric edema in the left abdomen associated with the dilated small bowel loops. Negative for free air. Musculoskeletal: No acute bone abnormality in the spine. Extensive facet arthropathy. IMPRESSION: 1. Small bowel obstruction. Dilated loops of small bowel in the left abdomen with mesenteric edema and well-defined transition point. Findings raise concern for a closed loop obstruction, possibly related to an internal hernia. Small amount of fluid in the abdomen and pelvis. 2. Colonic diverticulosis without acute colonic inflammation. 3. Moderate sized hiatal hernia. These results were called  by telephone at the time of interpretation on 01/12/2019 at 12:08 pm to Dr. Alvira MondayERIN Josede Cicero , who verbally acknowledged these results. Electronically Signed   By: Richarda OverlieAdam  Henn M.D.   On: 01/12/2019 12:16   Dg Abd Portable 1v-small Bowel Protocol-position Verification  Result Date: 01/12/2019 CLINICAL DATA:  85 year old female with history of nasogastric tube placement. Evaluate for bowel obstruction. EXAM: PORTABLE ABDOMEN - 1 VIEW COMPARISON:  CT the abdomen and pelvis 01/12/2019. FINDINGS: Multiple prominent mildly dilated loops of gas-filled small bowel in the central abdomen measuring up to 3.4 cm in diameter. A small amount of gas and stool is noted in the colon. No pneumoperitoneum on this single supine view. Nasogastric tube extends into the proximal body of the stomach. Either native contrast filling the urinary bladder, related to recent contrast enhanced CT examination. Status post left hip arthroplasty. IMPRESSION: 1. Tip of nasogastric tube is in the proximal body of the stomach. 2. Bowel gas pattern again suggests early or partial small bowel obstruction. Electronically Signed   By: Trudie Reedaniel  Entrikin M.D.   On: 01/12/2019 19:09    Procedures .Critical Care Performed by: Alvira MondaySchlossman, Sadao Weyer, MD Authorized by: Alvira MondaySchlossman, Paiten Boies, MD   Critical care provider statement:    Critical care time (minutes):  30   Critical care was time spent personally by me on the following activities:  Discussions with consultants, evaluation of patient's response to treatment, examination of patient, ordering and performing treatments and interventions, ordering and review of laboratory studies, ordering and review of radiographic studies, pulse oximetry, re-evaluation of patient's condition and obtaining history from patient or surrogate   (including critical care time)  Medications Ordered in ED Medications  levothyroxine (SYNTHROID, LEVOTHROID) injection 25 mcg (25 mcg Intravenous Given 01/12/19 1751)  pantoprazole  (PROTONIX) injection 40 mg (40 mg Intravenous Given 01/12/19 1750)  ondansetron (ZOFRAN) tablet 4 mg (has no administration in time range)    Or  ondansetron (ZOFRAN) injection 4 mg (has no administration in time range)  heparin injection 5,000 Units (5,000 Units Subcutaneous Given 01/12/19 2111)  dextrose 5 %-0.45 % sodium chloride infusion ( Intravenous Rate/Dose Verify 01/12/19 1800)  fentaNYL (SUBLIMAZE) injection 25 mcg (has no administration in time range)  hydrALAZINE (APRESOLINE) injection 10 mg (has no administration in time range)  phenol (CHLORASEPTIC) mouth spray 1 spray (has no administration in time range)  sodium chloride 0.9 % bolus 500 mL (0 mLs Intravenous Stopped 01/12/19 1010)  ondansetron (ZOFRAN) injection 4 mg (4 mg Intravenous Given 01/12/19 0944)  morphine 4 MG/ML injection 4 mg (4  mg Intravenous Given 01/12/19 1001)  iohexol (OMNIPAQUE) 300 MG/ML solution 100 mL (100 mLs Intravenous Contrast Given 01/12/19 1116)  morphine 4 MG/ML injection 4 mg (4 mg Intravenous Given 01/12/19 1132)  lidocaine (XYLOCAINE) 2 % viscous mouth solution (  Given 01/12/19 1330)  diatrizoate meglumine-sodium (GASTROGRAFIN) 66-10 % solution 90 mL (90 mLs Per NG tube Given 01/12/19 2045)     Initial Impression / Assessment and Plan / ED Course  I have reviewed the triage vital signs and the nursing notes.  Pertinent labs & imaging results that were available during my care of the patient were reviewed by me and considered in my medical decision making (see chart for details).        85yo female with history of htn, hlpd, DM, thyroid disease, presents with concern for abdominal pain, nausea and vomiting.    CT shows bowel obstruction with concern for closed loop obstruction and possible internal hernia.  Symptoms improved after morphine/zofran.  Discussed with Dr. Magnus IvanBlackman, General Surgery, who recommends NG, admission by hospitalist and he will evaluate the patient on arrival to the hospital.   Final  Clinical Impressions(s) / ED Diagnoses   Final diagnoses:  Generalized abdominal pain  SBO (small bowel obstruction) [facility](HCC)    ED Discharge Orders    None       Alvira MondaySchlossman, Uri Covey, MD 01/12/19 2150

## 2019-01-12 NOTE — Plan of Care (Addendum)
Patient received from [facility] via [facility]. Transferred from stretcher to bed in room. NG tube to right nare; clamped. Hooked back to LIWS once in room. NSL to Left A/C. No complaints of pain currently; will continue to monitor.    Family would like to ensure discussions with physician prior to any surgical procedures. Son Merry Pond [phone] (cell). Daughter Jefm Bryant [phone] (home), [phone] (cell). Son-in-law Hope Pigeon [phone]

## 2019-01-12 NOTE — ED Triage Notes (Signed)
Pt c/o generalized abd pain with vomiting since midnight. Denies diarrhea.

## 2019-01-12 NOTE — ED Notes (Signed)
Desat to 77% on room air after MSO4, placed on 2l/m [place] SpO2 100%, no resp distress noted.  RN aware

## 2019-01-12 NOTE — ED Notes (Signed)
Report given to [facility]. Pt and family updated. Pt's son given room # [place]

## 2019-01-12 NOTE — ED Notes (Signed)
Updated pts son regarding status at this time. Pts son verbalized preference of [facility] if possible if admitted. EDP Schlossman notified.

## 2019-01-12 NOTE — ED Notes (Signed)
CT will need to wait for results of bun/creat prior to imaging with iv contrast per [facility] Radiology Protocol, d/t age and hx DM

## 2019-01-13 ENCOUNTER — Inpatient Hospital Stay (HOSPITAL_COMMUNITY): Payer: Medicare Other

## 2019-01-13 LAB — TSH: TSH: 1.056 u[IU]/mL (ref 0.350–4.500)

## 2019-01-13 LAB — CBC
HCT: 40 % (ref 36.0–46.0)
Hemoglobin: 12.4 g/dL (ref 12.0–15.0)
MCH: 26.1 pg (ref 26.0–34.0)
MCHC: 31 g/dL (ref 30.0–36.0)
MCV: 84.2 fL (ref 80.0–100.0)
Platelets: 349 10*3/uL (ref 150–400)
RBC: 4.75 MIL/uL (ref 3.87–5.11)
RDW: 15.4 % (ref 11.5–15.5)
WBC: 11.7 10*3/uL — ABNORMAL HIGH (ref 4.0–10.5)
nRBC: 0 % (ref 0.0–0.2)

## 2019-01-13 LAB — BASIC METABOLIC PANEL
Anion gap: 12 (ref 5–15)
BUN: 27 mg/dL — ABNORMAL HIGH (ref 8–23)
CO2: 26 mmol/L (ref 22–32)
Calcium: 8.9 mg/dL (ref 8.9–10.3)
Chloride: 96 mmol/L — ABNORMAL LOW (ref 98–111)
Creatinine, Ser: 0.87 mg/dL (ref 0.44–1.00)
GFR calc Af Amer: >60 mL/min (ref >60)
GFR calc non Af Amer: 59 mL/min — ABNORMAL LOW (ref >60)
Glucose, Bld: 253 mg/dL — ABNORMAL HIGH (ref 70–99)
Potassium: 3.3 mmol/L — ABNORMAL LOW (ref 3.5–5.1)
Sodium: 134 mmol/L — ABNORMAL LOW (ref 135–145)

## 2019-01-13 LAB — URINE CULTURE: Culture: 10000 — AB

## 2019-01-13 LAB — MAGNESIUM: Magnesium: 2.2 mg/dL (ref 1.7–2.4)

## 2019-01-13 LAB — GLUCOSE, CAPILLARY
Glucose-Capillary: 231 mg/dL — ABNORMAL HIGH (ref 70–99)
Glucose-Capillary: 184 mg/dL — ABNORMAL HIGH (ref 70–99)
Glucose-Capillary: 150 mg/dL — ABNORMAL HIGH (ref 70–99)
Glucose-Capillary: 195 mg/dL — ABNORMAL HIGH (ref 70–99)

## 2019-01-13 MED ORDER — POTASSIUM CHLORIDE 10 MEQ/100ML IV SOLN: 10.0000 meq | INTRAVENOUS | Status: DC

## 2019-01-13 MED ORDER — POTASSIUM CHLORIDE 2 MEQ/ML IV SOLN
INTRAVENOUS | Status: DC
  Filled 2019-01-13: qty 1000

## 2019-01-13 MED ORDER — KCL IN DEXTROSE-NACL 20-5-0.45 MEQ/L-%-% IV SOLN
INTRAVENOUS | Status: DC
  Administered 2019-01-13 (×2): via INTRAVENOUS
  Filled 2019-01-13 (×3): qty 1000

## 2019-01-13 MED ORDER — POTASSIUM CHLORIDE 10 MEQ/100ML IV SOLN
10.0000 meq | INTRAVENOUS | Status: AC
  Administered 2019-01-13 (×3): 10 meq via INTRAVENOUS
  Filled 2019-01-13 (×3): qty 100

## 2019-01-13 MED ORDER — INSULIN ASPART 100 UNIT/ML ~~LOC~~ SOLN
0.0000 [IU] | SUBCUTANEOUS | Status: DC
  Administered 2019-01-13: 8 [IU] via SUBCUTANEOUS
  Administered 2019-01-13: 2 [IU] via SUBCUTANEOUS
  Administered 2019-01-13 (×2): 3 [IU] via SUBCUTANEOUS
  Administered 2019-01-13: 5 [IU] via SUBCUTANEOUS
  Administered 2019-01-14: 2 [IU] via SUBCUTANEOUS
  Administered 2019-01-14 – 2019-01-15 (×4): 3 [IU] via SUBCUTANEOUS
  Administered 2019-01-15 – 2019-01-16 (×4): 2 [IU] via SUBCUTANEOUS
  Administered 2019-01-16 (×2): 3 [IU] via SUBCUTANEOUS
  Administered 2019-01-16: 2 [IU] via SUBCUTANEOUS
  Administered 2019-01-16: 3 [IU] via SUBCUTANEOUS
  Administered 2019-01-16: 2 [IU] via SUBCUTANEOUS
  Administered 2019-01-17: 3 [IU] via SUBCUTANEOUS
  Administered 2019-01-17: 2 [IU] via SUBCUTANEOUS
  Administered 2019-01-17: 3 [IU] via SUBCUTANEOUS
  Administered 2019-01-17: 2 [IU] via SUBCUTANEOUS
  Administered 2019-01-17 (×2): 3 [IU] via SUBCUTANEOUS
  Administered 2019-01-18 (×2): 2 [IU] via SUBCUTANEOUS
  Administered 2019-01-18 – 2019-01-19 (×5): 3 [IU] via SUBCUTANEOUS
  Administered 2019-01-19: 2 [IU] via SUBCUTANEOUS

## 2019-01-13 MED ORDER — DIATRIZOATE MEGLUMINE & SODIUM 66-10 % PO SOLN
90.0000 mL | Freq: Once | ORAL | Status: AC
  Administered 2019-01-13: 90 mL via NASOGASTRIC
  Filled 2019-01-13: qty 90

## 2019-01-13 NOTE — Progress Notes (Signed)
Call to IR to make them aware Gastrografin was give at 1108. None of the gastrografin was suctioned back to container as suction was completely disconnected when it was given. poke with Anderson Malta. Donne Hazel, RN

## 2019-01-13 NOTE — Progress Notes (Addendum)
[facility] Progress Note     Subjective: CC-  States that she still has some abdominal pain, but it is better than before she got admitted. Denies n/v. No flatus or BM. NG tube with 800cc out last 24 hours.   Objective: Vital signs in last 24 hours: Temp:  [97.5 F (36.4 C)-98.1 F (36.7 C)] 98.1 F (36.7 C) (06/08 0533) Pulse Rate:  [69-82] 74 (06/08 0533) Resp:  [10-20] 17 (06/08 0533) BP: (120-167)/(71-84) 129/74 (06/08 0533) SpO2:  [93 %-100 %] 93 % (06/08 0533) Weight:  [67.6 kg] 67.6 kg (06/07 0929) Last BM Date: 01/10/19  Intake/Output from previous day: 06/07 0701 - 06/08 0700 In: 1396.5 [I.V.:896.4; IV Piggyback:500] Out: 800 [Emesis/NG output:800] Intake/Output this shift: No intake/output data recorded.  PE: Gen:  Alert, NAD, pleasant HEENT: EOM's intact, pupils equal and round Card:  RRR Pulm:  CTAB, no W/R/R, effort normal Abd: Soft, mild distension, hypoactive BS, TTP LLQ with no peritonitis, no HSM Ext:  Calves soft and nontender Psych: A&Ox3  Skin: no rashes noted, warm and dry  Lab Results:  Recent Labs    01/12/19 0928 01/13/19 0320  WBC 9.6 11.7*  HGB 11.5* 12.4  HCT 37.7 40.0  PLT 349 349   BMET Recent Labs    01/12/19 0928 01/13/19 0320  NA 135 134*  K 3.2* 3.3*  CL 95* 96*  CO2 26 26  GLUCOSE 230* 253*  BUN 26* 27*  CREATININE 1.09* 0.87  CALCIUM 9.6 8.9   PT/INR No results for input(s): LABPROT, INR in the last 72 hours. CMP     Component Value Date/Time   NA 134 (L) 01/13/2019 0320   K 3.3 (L) 01/13/2019 0320   CL 96 (L) 01/13/2019 0320   CO2 26 01/13/2019 0320   GLUCOSE 253 (H) 01/13/2019 0320   BUN 27 (H) 01/13/2019 0320   CREATININE 0.87 01/13/2019 0320   CALCIUM 8.9 01/13/2019 0320   PROT 7.5 01/12/2019 0928   ALBUMIN 4.4 01/12/2019 0928   AST 21 01/12/2019 0928   ALT 16 01/12/2019 0928   ALKPHOS 61 01/12/2019 0928   BILITOT 0.7 01/12/2019 0928   GFRNONAA 59 (L) 01/13/2019 0320   GFRAA >60  01/13/2019 0320   Lipase     Component Value Date/Time   LIPASE 29 01/12/2019 0928       Studies/Results: Ct Abdomen Pelvis W Contrast  Result Date: 01/12/2019 CLINICAL DATA:  85 year old with bowel obstruction. Acute lower abdominal pain with nausea. EXAM: CT ABDOMEN AND PELVIS WITH CONTRAST TECHNIQUE: Multidetector CT imaging of the abdomen and pelvis was performed using the standard protocol following bolus administration of intravenous contrast. CONTRAST:  121mL OMNIPAQUE IOHEXOL 300 MG/ML  SOLN COMPARISON:  None. FINDINGS: Lower chest: Lung bases are clear.  No pleural effusions. Hepatobiliary: Normal appearance of the liver. Small hypodensity in the right hepatic lobe on image 19 is probably an incidental finding. Portal venous system is patent. Normal appearance of the gallbladder without inflammatory changes. No biliary dilatation. Pancreas: Unremarkable. No pancreatic ductal dilatation or surrounding inflammatory changes. Spleen: Normal in size without focal abnormality. Adrenals/Urinary Tract: Normal adrenal glands. Urinary bladder is unremarkable but slightly limited evaluation due to artifact from left hip replacement. Normal appearance of both kidneys without hydronephrosis. Stomach/Bowel: Extensive diverticulosis in the sigmoid colon and left colon without acute colonic inflammation. Normal appendix. Moderate sized hiatal hernia. Normal appearance of the duodenum. Dilated small bowel loops in the left abdomen compatible with jejunum. There is mesenteric edema  associated with these dilated small bowel loops. Small bowel loops measure up to 3.3 cm. Distal small bowel is decompressed. Small bowel transition point in the left abdomen near the mesenteric edema. Transition point best seen on the coronal reformats, sequence 5 image 42 and sagittal reformats, sequence 6, image 84. Irregularity of the mesentery and fat near the bowel obstruction and raises concern for a closed loop obstruction and  possibly an internal hernia. No evidence for pneumatosis or portal air. Vascular/Lymphatic: Atherosclerotic disease in the abdominal aorta without aneurysm. No abdominopelvic lymphadenopathy. Reproductive: Uterus and bilateral adnexa are unremarkable. Other: Small amount of free fluid in the pelvis. Minimal fluid in the left paracolic gutter. Mesenteric edema in the left abdomen associated with the dilated small bowel loops. Negative for free air. Musculoskeletal: No acute bone abnormality in the spine. Extensive facet arthropathy. IMPRESSION: 1. Small bowel obstruction. Dilated loops of small bowel in the left abdomen with mesenteric edema and well-defined transition point. Findings raise concern for a closed loop obstruction, possibly related to an internal hernia. Small amount of fluid in the abdomen and pelvis. 2. Colonic diverticulosis without acute colonic inflammation. 3. Moderate sized hiatal hernia. These results were called by telephone at the time of interpretation on 01/12/2019 at 12:08 pm to Dr. Alvira MondayERIN SCHLOSSMAN , who verbally acknowledged these results. Electronically Signed   By: Richarda OverlieAdam  Henn M.D.   On: 01/12/2019 12:16   Dg Abd Portable 1v-small Bowel Obstruction Protocol-initial, 8 Hr Delay  Result Date: 01/13/2019 CLINICAL DATA:  Small bowel obstruction. EXAM: PORTABLE ABDOMEN - 1 VIEW COMPARISON:  One-view abdomen 01/12/2019 FINDINGS: NG tube remains in place. Dilated loops of small bowel are slightly less prominent than on the prior study. Gas is present throughout the colon. Contrast is noted within the urinary bladder. Left hip replacement is evident. IMPRESSION: 1. Improving small bowel obstruction with NG tube in place. Electronically Signed   By: Marin Robertshristopher  Mattern M.D.   On: 01/13/2019 07:42   Dg Abd Portable 1v-small Bowel Protocol-position Verification  Result Date: 01/12/2019 CLINICAL DATA:  85 year old female with history of nasogastric tube placement. Evaluate for bowel obstruction.  EXAM: PORTABLE ABDOMEN - 1 VIEW COMPARISON:  CT the abdomen and pelvis 01/12/2019. FINDINGS: Multiple prominent mildly dilated loops of gas-filled small bowel in the central abdomen measuring up to 3.4 cm in diameter. A small amount of gas and stool is noted in the colon. No pneumoperitoneum on this single supine view. Nasogastric tube extends into the proximal body of the stomach. Either native contrast filling the urinary bladder, related to recent contrast enhanced CT examination. Status post left hip arthroplasty. IMPRESSION: 1. Tip of nasogastric tube is in the proximal body of the stomach. 2. Bowel gas pattern again suggests early or partial small bowel obstruction. Electronically Signed   By: Trudie Reedaniel  Entrikin M.D.   On: 01/12/2019 19:09    Anti-infectives: Anti-infectives (From admission, onward)   None       Assessment/Plan HTN HLD Hypothyroidism Hyperglycemia - start SSI, check A1c Hiatal hernia  SBO - no prior h/o abdominal surgery - CT scan 6/7 shows SBO with dilated loops of small bowel in the left abdomen with mesenteric edema and well-defined transition point; possible closed loop obstruction  ID - none FEN - IVF (add K to IVF), NPO/NGT to LIWS VTE - SCDs, sq heparin Foley - none Follow up - TBD Contact - co-POA's are her children Aurea GraffJoan and Link Snufferddie [phone]  Plan - Xray today shows slightly less dilated loops of  small bowel, gas throughout the colon. I do not see any gastrograffin on xray. NG tube with fairly high output and no return in bowel function. Tender on abdominal exam but no peritonitis. Continue NPO/NGT to LIWS. Repeat small bowel protocol. Mobilize. Will discuss plan with MD.   LOS: 1 day    Franne FortsBrooke A Dae Antonucci , [facility] 01/13/2019, 8:32 AM Pager: [phone] Mon-Thurs 7:00 am-4:30 pm Fri 7:00 am -11:30 AM Sat-Sun 7:00 am-11:30 am

## 2019-01-13 NOTE — Progress Notes (Signed)
PROGRESS NOTE    Christie Hinton  JQB:[id number] DOB: 12-24-1927 DOA: 01/12/2019 PCP: Belia Heman, NP   Brief Narrative: Patient is 85 year old female with history of hypertension, hyperlipidemia, GERD who presented initially to [facility] with complaints of generalized abdominal pain that started after dinner, vomiting.  Patient reported lower abdominal pain which progressively got worse and also developed nausea and vomiting after that.  Last bowel movement was 2 days ago before admission.  CT abdomen showed small bowel obstruction with dilated loops of small bowel.  Findings concerning for closed-loop obstruction.  General surgery consulted.  Started on conservative management.  Assessment & Plan:   Principal Problem:   SBO (small bowel obstruction) (HCC) Active Problems:   HTN (hypertension)   Hyperlipidemia   SBO: CT finding as above.  Abdominal x-ray done this morning showed improved small bowel obstruction.  Abdomen pain is better this morning.  Not passing gas, no bowel movement.  NG tube draining dark fluid.  Continue pain management, IV fluids, antiemetics.  General surgery following.  Encourage ambulation.  Hypokalemia: Being supplemented with potassium  Hypertension: Currently blood pressure stable.  Continue PRN meds  Hyperlipidemia: On Lipitor at home  Hypothyroidism: Continue IV Synthyroid.         DVT prophylaxis: heparin [place] Code Status: Full Family Communication: Will call family  Disposition Plan: Home after resolution of small bowel obstruction   Consultants: General surgery  Procedures: None  Antimicrobials:  Anti-infectives (From admission, onward)   None      Subjective: Patient seen and examined the bedside this morning.  Hemodynamically stable.  Looks comfortable.  Abdominal pain is better but is still she has some discomfort.  NG tube draining dark fluid.  Objective: Vitals:   01/12/19 1500 01/12/19 1646 01/12/19 2117  01/13/19 0533  BP: 120/71 128/74 136/73 129/74  Pulse: 79 81 80 74  Resp: 17 12 15 17   Temp:  (!) 97.5 F (36.4 C) 98 F (36.7 C) 98.1 F (36.7 C)  TempSrc:  Oral Oral Oral  SpO2: 96% 94% 94% 93%  Weight:      Height:        Intake/Output Summary (Last 24 hours) at 01/13/2019 1239 Last data filed at 01/13/2019 1107 Gross per 24 hour  Intake 896.43 ml  Output 1150 ml  Net -253.57 ml   Filed Weights   01/12/19 0929  Weight: 67.6 kg    Examination:  General exam: Not in distress, pleasant elderly female HEENT:PERRL,Oral mucosa moist, Ear/Nose normal on gross exam, NG tube Respiratory system: Bilateral equal air entry, normal vesicular breath sounds, no wheezes or crackles  Cardiovascular system: S1 & S2 heard, RRR. No JVD, murmurs, rubs, gallops or clicks. No pedal edema. Gastrointestinal system: Abdomen is nondistended, soft .  Mild diffuse tenderness.  No bowel sounds heard. Central nervous system: Alert and oriented. No focal neurological deficits. Extremities: No edema, no clubbing ,no cyanosis, distal peripheral pulses palpable. Skin: No rashes, lesions or ulcers,no icterus ,no pallor MSK: Normal muscle bulk,tone ,power Psychiatry: Judgement and insight appear normal. Mood & affect appropriate.     Data Reviewed: I have personally reviewed following labs and imaging studies  CBC: Recent Labs  Lab 01/12/19 0928 01/13/19 0320  WBC 9.6 11.7*  NEUTROABS 8.1*  --   HGB 11.5* 12.4  HCT 37.7 40.0  MCV 82.7 84.2  PLT 349 409   Basic Metabolic Panel: Recent Labs  Lab 01/12/19 0928 01/13/19 0320  NA 135 134*  K 3.2*  3.3*  CL 95* 96*  CO2 26 26  GLUCOSE 230* 253*  BUN 26* 27*  CREATININE 1.09* 0.87  CALCIUM 9.6 8.9  MG  --  2.2   GFR: Estimated Creatinine Clearance: 41.9 mL/min (by C-G formula based on SCr of 0.87 mg/dL). Liver Function Tests: Recent Labs  Lab 01/12/19 0928  AST 21  ALT 16  ALKPHOS 61  BILITOT 0.7  PROT 7.5  ALBUMIN 4.4   Recent  Labs  Lab 01/12/19 0928  LIPASE 29   No results for input(s): AMMONIA in the last 168 hours. Coagulation Profile: No results for input(s): INR, PROTIME in the last 168 hours. Cardiac Enzymes: Recent Labs  Lab 01/12/19 0928  TROPONINI <0.03   BNP (last 3 results) No results for input(s): PROBNP in the last 8760 hours. HbA1C: No results for input(s): HGBA1C in the last 72 hours. CBG: Recent Labs  Lab 01/13/19 1104  GLUCAP 231*   Lipid Profile: No results for input(s): CHOL, HDL, LDLCALC, TRIG, CHOLHDL, LDLDIRECT in the last 72 hours. Thyroid Function Tests: Recent Labs    01/13/19 0320  TSH 1.056   Anemia Panel: No results for input(s): VITAMINB12, FOLATE, FERRITIN, TIBC, IRON, RETICCTPCT in the last 72 hours. Sepsis Labs: No results for input(s): PROCALCITON, LATICACIDVEN in the last 168 hours.  Recent Results (from the past 240 hour(s))  Urine culture     Status: Abnormal   Collection Time: 01/12/19  9:31 AM  Result Value Ref Range Status   Specimen Description   Final    URINE, RANDOM Performed at [facility], [address] [place], [place] [address]    Special Requests   Final    NONE Performed at [facility], [address] [place], [place] [address]    Culture (A)  Final    10,000 COLONIES/mL GROUP B STREP(S.AGALACTIAE)ISOLATED TESTING AGAINST S. AGALACTIAE NOT ROUTINELY PERFORMED DUE TO PREDICTABILITY OF AMP/PEN/VAN SUSCEPTIBILITY. Performed at [facility], [address] [place], [place] [address]    Report Status 01/13/2019 FINAL  Final  SARS Coronavirus 2 (Hosp order,Performed in [facility] lab via [employer])     Status: None   Collection Time: 01/12/19 11:21 AM  Result Value Ref Range Status   SARS Coronavirus 2 ([employer] Now) NEGATIVE NEGATIVE Final    Comment: (NOTE) Interpretive Result Comment(s): COVID 19 Positive SARS CoV 2 target nucleic acids are DETECTED. The SARS CoV 2 RNA is generally detectable in  upper and lower respiratory specimens during the acute phase of infection.  Positive results are indicative of active infection with SARS CoV 2.  Clinical correlation with patient history and other diagnostic information is necessary to determine patient infection status.  Positive results do not rule out bacterial infection or coinfection with other viruses. The expected result is Negative. COVID 19 Negative SARS CoV 2 target nucleic acids are NOT DETECTED. The SARS CoV 2 RNA is generally detectable in upper and lower respiratory specimens during the acute phase of infection.  Negative results do not preclude SARS CoV 2 infection, do not rule out coinfections with other pathogens, and should not be used as the sole basis for treatment or other patient management decisions.  Negative results must be combined with clinical  observations, patient history, and epidemiological information. The expected result is Negative. Invalid Presence or absence of SARS CoV 2 nucleic acids cannot be determined. Repeat testing was performed on the submitted specimen and repeated Invalid results were  obtained.  If clinically indicated, additional testing on a new specimen with an alternate test methodology [phone]([id number]) is advised.  The SARS CoV 2 RNA is generally detectable in upper and lower respiratory specimens during the acute phase of infection. The expected result is Negative. Fact Sheet for Patients:  http://www.graves-ford.org/https://www.fda.gov/media/136524/download Fact Sheet for Healthcare Providers: EnviroConcern.sihttps://www.fda.gov/media/136523/download This test is not yet approved or cleared by the [place] FDA and has been authorized for detection and/or diagnosis of SARS CoV 2 by FDA under an Emergency Use Authorization (EUA).  This EUA will remain in effect (meaning this test can be used) for the duration of the COVID19 d eclaration under Section 564(b)(1) of the Act, 21 U.S.C. section [phone] 3(b)(1), unless the  authorization is terminated or revoked sooner. Performed at [facility], [address] [place], [place] [address]   SARS Coronavirus 2 (CEPHEID - Performed in [facility] hospital lab), Hosp Order     Status: None   Collection Time: 01/12/19  8:05 PM  Result Value Ref Range Status   SARS Coronavirus 2 NEGATIVE NEGATIVE Final    Comment: (NOTE) If result is NEGATIVE SARS-CoV-2 target nucleic acids are NOT DETECTED. The SARS-CoV-2 RNA is generally detectable in upper and lower  respiratory specimens during the acute phase of infection. The lowest  concentration of SARS-CoV-2 viral copies this assay can detect is 250  copies / mL. A negative result does not preclude SARS-CoV-2 infection  and should not be used as the sole basis for treatment or other  patient management decisions.  A negative result may occur with  improper specimen collection / handling, submission of specimen other  than nasopharyngeal swab, presence of viral mutation(s) within the  areas targeted by this assay, and inadequate number of viral copies  (<250 copies / mL). A negative result must be combined with clinical  observations, patient history, and epidemiological information. If result is POSITIVE SARS-CoV-2 target nucleic acids are DETECTED. The SARS-CoV-2 RNA is generally detectable in upper and lower  respiratory specimens dur ing the acute phase of infection.  Positive  results are indicative of active infection with SARS-CoV-2.  Clinical  correlation with patient history and other diagnostic information is  necessary to determine patient infection status.  Positive results do  not rule out bacterial infection or co-infection with other viruses. If result is PRESUMPTIVE POSTIVE SARS-CoV-2 nucleic acids MAY BE PRESENT.   A presumptive positive result was obtained on the submitted specimen  and confirmed on repeat testing.  While 2019 novel coronavirus  (SARS-CoV-2) nucleic acids may be  present in the submitted sample  additional confirmatory testing may be necessary for epidemiological  and / or clinical management purposes  to differentiate between  SARS-CoV-2 and other Sarbecovirus currently known to infect humans.  If clinically indicated additional testing with an alternate test  methodology [phone]([id number]) is advised. The SARS-CoV-2 RNA is generally  detectable in upper and lower respiratory sp ecimens during the acute  phase of infection. The expected result is Negative. Fact Sheet for Patients:  BoilerBrush.com.cyhttps://www.fda.gov/media/136312/download Fact Sheet for Healthcare Providers: https://pope.com/https://www.fda.gov/media/136313/download This test is not yet approved or cleared by the [place] FDA and has been authorized for detection and/or diagnosis of SARS-CoV-2 by FDA under an Emergency Use Authorization (EUA).  This EUA will remain in effect (meaning this test can be used) for the duration of the COVID-19 declaration under Section 564(b)(1) of the Act, 21 U.S.C. section 360bbb-3(b)(1), unless the authorization is terminated or revoked sooner. Performed  at [facility], [address] [place], [place] [address]          Radiology Studies: Ct Abdomen Pelvis W Contrast  Result Date: 01/12/2019 CLINICAL DATA:  85 year old with bowel obstruction. Acute lower abdominal pain with nausea. EXAM: CT ABDOMEN AND PELVIS WITH CONTRAST TECHNIQUE: Multidetector CT imaging of the abdomen and pelvis was performed using the standard protocol following bolus administration of intravenous contrast. CONTRAST:  100mL OMNIPAQUE IOHEXOL 300 MG/ML  SOLN COMPARISON:  None. FINDINGS: Lower chest: Lung bases are clear.  No pleural effusions. Hepatobiliary: Normal appearance of the liver. Small hypodensity in the right hepatic lobe on image 19 is probably an incidental finding. Portal venous system is patent. Normal appearance of the gallbladder without inflammatory changes. No biliary  dilatation. Pancreas: Unremarkable. No pancreatic ductal dilatation or surrounding inflammatory changes. Spleen: Normal in size without focal abnormality. Adrenals/Urinary Tract: Normal adrenal glands. Urinary bladder is unremarkable but slightly limited evaluation due to artifact from left hip replacement. Normal appearance of both kidneys without hydronephrosis. Stomach/Bowel: Extensive diverticulosis in the sigmoid colon and left colon without acute colonic inflammation. Normal appendix. Moderate sized hiatal hernia. Normal appearance of the duodenum. Dilated small bowel loops in the left abdomen compatible with jejunum. There is mesenteric edema associated with these dilated small bowel loops. Small bowel loops measure up to 3.3 cm. Distal small bowel is decompressed. Small bowel transition point in the left abdomen near the mesenteric edema. Transition point best seen on the coronal reformats, sequence 5 image 42 and sagittal reformats, sequence 6, image 84. Irregularity of the mesentery and fat near the bowel obstruction and raises concern for a closed loop obstruction and possibly an internal hernia. No evidence for pneumatosis or portal air. Vascular/Lymphatic: Atherosclerotic disease in the abdominal aorta without aneurysm. No abdominopelvic lymphadenopathy. Reproductive: Uterus and bilateral adnexa are unremarkable. Other: Small amount of free fluid in the pelvis. Minimal fluid in the left paracolic gutter. Mesenteric edema in the left abdomen associated with the dilated small bowel loops. Negative for free air. Musculoskeletal: No acute bone abnormality in the spine. Extensive facet arthropathy. IMPRESSION: 1. Small bowel obstruction. Dilated loops of small bowel in the left abdomen with mesenteric edema and well-defined transition point. Findings raise concern for a closed loop obstruction, possibly related to an internal hernia. Small amount of fluid in the abdomen and pelvis. 2. Colonic diverticulosis  without acute colonic inflammation. 3. Moderate sized hiatal hernia. These results were called by telephone at the time of interpretation on 01/12/2019 at 12:08 pm to Dr. Alvira MondayERIN SCHLOSSMAN , who verbally acknowledged these results. Electronically Signed   By: Richarda OverlieAdam  Henn M.D.   On: 01/12/2019 12:16   Dg Abd Portable 1v-small Bowel Obstruction Protocol-initial, 8 Hr Delay  Result Date: 01/13/2019 CLINICAL DATA:  Small bowel obstruction. EXAM: PORTABLE ABDOMEN - 1 VIEW COMPARISON:  One-view abdomen 01/12/2019 FINDINGS: NG tube remains in place. Dilated loops of small bowel are slightly less prominent than on the prior study. Gas is present throughout the colon. Contrast is noted within the urinary bladder. Left hip replacement is evident. IMPRESSION: 1. Improving small bowel obstruction with NG tube in place. Electronically Signed   By: Marin Robertshristopher  Mattern M.D.   On: 01/13/2019 07:42   Dg Abd Portable 1v-small Bowel Protocol-position Verification  Result Date: 01/12/2019 CLINICAL DATA:  85 year old female with history of nasogastric tube placement. Evaluate for bowel obstruction. EXAM: PORTABLE ABDOMEN - 1 VIEW COMPARISON:  CT the abdomen and pelvis 01/12/2019. FINDINGS: Multiple prominent  mildly dilated loops of gas-filled small bowel in the central abdomen measuring up to 3.4 cm in diameter. A small amount of gas and stool is noted in the colon. No pneumoperitoneum on this single supine view. Nasogastric tube extends into the proximal body of the stomach. Either native contrast filling the urinary bladder, related to recent contrast enhanced CT examination. Status post left hip arthroplasty. IMPRESSION: 1. Tip of nasogastric tube is in the proximal body of the stomach. 2. Bowel gas pattern again suggests early or partial small bowel obstruction. Electronically Signed   By: Trudie Reedaniel  Entrikin M.D.   On: 01/12/2019 19:09        Scheduled Meds: . heparin  5,000 Units Subcutaneous Q8H  . insulin aspart  0-15  Units Subcutaneous Q4H  . levothyroxine  25 mcg Intravenous Daily  . pantoprazole (PROTONIX) IV  40 mg Intravenous Q24H   Continuous Infusions: . dextrose 5 % and 0.45 % NaCl with KCl 20 mEq/L 75 mL/hr at 01/13/19 0954  . potassium chloride 10 mEq (01/13/19 1218)     LOS: 1 day    Time spent: 35 mins.More than 50% of that time was spent in counseling and/or coordination of care.      Burnadette PopAmrit Detrich Rakestraw, MD [facility] Pager [phone]  If 7PM-7AM, please contact night-coverage [email or web address] Password [email or web address] 01/13/2019, 12:39 PM

## 2019-01-13 NOTE — Evaluation (Signed)
Physical Therapy Evaluation Patient Details Name: Christie Hinton MRN: [id number] DOB: Dec 29, 1927 Today's Date: 01/13/2019   History of Present Illness  85 yo female admitted with SBO. Hx of DM  Clinical Impression  On eval, pt was Min guard assist for mobility. She walked ~500 feet around the unit using her cane for stability. She tolerated activity well. Will plan to keep on caseload for now. Recommend daily ambulation with nursing supervision in addition to PT session. At this time, no f/u PT is recommended. Will update recommendations as needed.     Follow Up Recommendations No PT follow up(at this time. Will continue to update.)    Equipment Recommendations  None recommended by PT    Recommendations for Other Services       Precautions / Restrictions Precautions Precautions: Fall Restrictions Weight Bearing Restrictions: No      Mobility  Bed Mobility Overal bed mobility: Needs Assistance Bed Mobility: Supine to Sit;Sit to Supine     Supine to sit: Supervision Sit to supine: Supervision   General bed mobility comments: for safety, lines  Transfers Overall transfer level: Needs assistance Equipment used: Rolling walker (2 wheeled) Transfers: Sit to/from Stand Sit to Stand: Min guard         General transfer comment: Close guard for safety.   Ambulation/Gait Ambulation/Gait assistance: Min guard Gait Distance (Feet): 500 Feet Assistive device: Straight cane       General Gait Details: close guard for safety. mildly unsteady at times but no lob.   Stairs            Wheelchair Mobility    Modified Rankin (Stroke Patients Only)       Balance Overall balance assessment: Mild deficits observed, not formally tested                                           Pertinent Vitals/Pain Pain Assessment: No/denies pain    Home Living Family/patient expects to be discharged to:: Private residence Living Arrangements: Alone   Type of  Home: House Home Access: Stairs to enter Entrance Stairs-Rails: Right Entrance Stairs-Number of Steps: 2 Home Layout: One level Home Equipment: Cane - single point      Prior Function Level of Independence: Independent with assistive device(s)         Comments: uses cane PRN     Hand Dominance        Extremity/Trunk Assessment   Upper Extremity Assessment Upper Extremity Assessment: Overall WFL for tasks assessed    Lower Extremity Assessment Lower Extremity Assessment: Generalized weakness    Cervical / Trunk Assessment Cervical / Trunk Assessment: Normal  Communication   Communication: HOH  Cognition Arousal/Alertness: Awake/alert Behavior During Therapy: WFL for tasks assessed/performed Overall Cognitive Status: Within Functional Limits for tasks assessed                                        General Comments      Exercises     Assessment/Plan    PT Assessment Patient needs continued PT services  PT Problem List Decreased balance;Decreased mobility       PT Treatment Interventions DME instruction;Functional mobility training;Patient/family education;Therapeutic activities;Therapeutic exercise;Gait training    PT Goals (Current goals can be found in the Care Plan section)  Acute Rehab PT  Goals Patient Stated Goal: none stated PT Goal Formulation: With patient Time For Goal Achievement: 01/27/19 Potential to Achieve Goals: Good    Frequency Min 3X/week   Barriers to discharge        Co-evaluation               AM-PAC PT "6 Clicks" Mobility  Outcome Measure Help needed turning from your back to your side while in a flat bed without using bedrails?: None Help needed moving from lying on your back to sitting on the side of a flat bed without using bedrails?: A Little Help needed moving to and from a bed to a chair (including a wheelchair)?: A Little Help needed standing up from a chair using your arms (e.g., wheelchair  or bedside chair)?: A Little Help needed to walk in hospital room?: A Little Help needed climbing 3-5 steps with a railing? : A Little 6 Click Score: 19    End of Session   Activity Tolerance: Patient tolerated treatment well Patient left: in bed;with call bell/phone within reach;with bed alarm set   PT Visit Diagnosis: Unsteadiness on feet (R26.81)    Time: [id number] PT Time Calculation (min) (ACUTE ONLY): 14 min   Charges:   PT Evaluation $PT Eval Moderate Complexity: 1 Mod           Rebeca AlertJannie Dreana Britz, PT Acute Rehabilitation Services Pager: [phone] Office: [phone]

## 2019-01-14 ENCOUNTER — Inpatient Hospital Stay (HOSPITAL_COMMUNITY): Payer: Medicare Other | Admitting: Certified Registered"

## 2019-01-14 ENCOUNTER — Encounter (HOSPITAL_COMMUNITY)
Admission: EM | Disposition: A | Discharge status: Home Health | Payer: Self-pay | Source: Home / Self Care | Attending: Internal Medicine

## 2019-01-14 ENCOUNTER — Inpatient Hospital Stay (HOSPITAL_COMMUNITY): Payer: Medicare Other

## 2019-01-14 ENCOUNTER — Inpatient Hospital Stay: Payer: Self-pay

## 2019-01-14 HISTORY — PX: LAPAROTOMY: SHX154

## 2019-01-14 LAB — GLUCOSE, CAPILLARY
Glucose-Capillary: 111 mg/dL — ABNORMAL HIGH (ref 70–99)
Glucose-Capillary: 143 mg/dL — ABNORMAL HIGH (ref 70–99)
Glucose-Capillary: 162 mg/dL — ABNORMAL HIGH (ref 70–99)
Glucose-Capillary: 167 mg/dL — ABNORMAL HIGH (ref 70–99)
Glucose-Capillary: 182 mg/dL — ABNORMAL HIGH (ref 70–99)
Glucose-Capillary: 186 mg/dL — ABNORMAL HIGH (ref 70–99)
Glucose-Capillary: 190 mg/dL — ABNORMAL HIGH (ref 70–99)

## 2019-01-14 LAB — CBC
HCT: 40.4 % (ref 36.0–46.0)
Hemoglobin: 12.8 g/dL (ref 12.0–15.0)
MCH: 26 pg (ref 26.0–34.0)
MCHC: 31.7 g/dL (ref 30.0–36.0)
MCV: 82.1 fL (ref 80.0–100.0)
Platelets: 379 10*3/uL (ref 150–400)
RBC: 4.92 MIL/uL (ref 3.87–5.11)
RDW: 15.6 % — ABNORMAL HIGH (ref 11.5–15.5)
WBC: 13.6 10*3/uL — ABNORMAL HIGH (ref 4.0–10.5)
nRBC: 0 % (ref 0.0–0.2)

## 2019-01-14 LAB — ABO/RH: ABO/RH(D): O POS

## 2019-01-14 LAB — HEMOGLOBIN A1C
Hgb A1c MFr Bld: 6.6 % — ABNORMAL HIGH (ref 4.8–5.6)
Mean Plasma Glucose: 142.72 mg/dL

## 2019-01-14 LAB — BASIC METABOLIC PANEL
Anion gap: 10 (ref 5–15)
BUN: 37 mg/dL — ABNORMAL HIGH (ref 8–23)
CO2: 25 mmol/L (ref 22–32)
Calcium: 8.8 mg/dL — ABNORMAL LOW (ref 8.9–10.3)
Chloride: 96 mmol/L — ABNORMAL LOW (ref 98–111)
Creatinine, Ser: 0.89 mg/dL (ref 0.44–1.00)
GFR calc Af Amer: >60 mL/min (ref >60)
GFR calc non Af Amer: 57 mL/min — ABNORMAL LOW (ref >60)
Glucose, Bld: 196 mg/dL — ABNORMAL HIGH (ref 70–99)
Potassium: 3.6 mmol/L (ref 3.5–5.1)
Sodium: 131 mmol/L — ABNORMAL LOW (ref 135–145)

## 2019-01-14 LAB — TYPE AND SCREEN
ABO/RH(D): O POS
Antibody Screen: NEGATIVE

## 2019-01-14 SURGERY — LAPAROTOMY, EXPLORATORY
Anesthesia: General | Site: Abdomen

## 2019-01-14 MED ORDER — PHENYLEPHRINE 40 MCG/ML (10ML) SYRINGE FOR IV PUSH (FOR BLOOD PRESSURE SUPPORT)
PREFILLED_SYRINGE | INTRAVENOUS | Status: DC | PRN
  Administered 2019-01-14: 80 ug via INTRAVENOUS
  Administered 2019-01-14: 120 ug via INTRAVENOUS
  Administered 2019-01-14: 80 ug via INTRAVENOUS

## 2019-01-14 MED ORDER — 0.9 % SODIUM CHLORIDE (POUR BTL) OPTIME
TOPICAL | Status: DC | PRN
  Administered 2019-01-14: 12:00:00 2000 mL

## 2019-01-14 MED ORDER — HYDROMORPHONE HCL 1 MG/ML IJ SOLN
0.2500 mg | INTRAMUSCULAR | Status: DC | PRN
  Administered 2019-01-14 (×3): 0.5 mg via INTRAVENOUS

## 2019-01-14 MED ORDER — DEXAMETHASONE SODIUM PHOSPHATE 10 MG/ML IJ SOLN
INTRAMUSCULAR | Status: DC | PRN
  Administered 2019-01-14: 4 mg via INTRAVENOUS

## 2019-01-14 MED ORDER — ONDANSETRON HCL 4 MG/2ML IJ SOLN: 4.0000 mg | Freq: Once | INTRAMUSCULAR | Status: DC | PRN

## 2019-01-14 MED ORDER — SODIUM CHLORIDE 0.9 % IV SOLN
2.0000 g | Freq: Once | INTRAVENOUS | Status: AC
  Administered 2019-01-14: 10 g via INTRAVENOUS

## 2019-01-14 MED ORDER — DEXMEDETOMIDINE HCL 200 MCG/2ML IV SOLN
INTRAVENOUS | Status: DC | PRN
  Administered 2019-01-14: 4 ug via INTRAVENOUS

## 2019-01-14 MED ORDER — PHENYLEPHRINE 40 MCG/ML (10ML) SYRINGE FOR IV PUSH (FOR BLOOD PRESSURE SUPPORT)
PREFILLED_SYRINGE | INTRAVENOUS | Status: AC
  Filled 2019-01-14: qty 10

## 2019-01-14 MED ORDER — FENTANYL CITRATE (PF) 250 MCG/5ML IJ SOLN
INTRAMUSCULAR | Status: AC
  Filled 2019-01-14: qty 5

## 2019-01-14 MED ORDER — ACETAMINOPHEN 10 MG/ML IV SOLN
1000.0000 mg | Freq: Four times a day (QID) | INTRAVENOUS | Status: AC
  Administered 2019-01-14 – 2019-01-15 (×4): 1000 mg via INTRAVENOUS
  Filled 2019-01-14 (×4): qty 100

## 2019-01-14 MED ORDER — LACTATED RINGERS IV SOLN
INTRAVENOUS | Status: DC | PRN
  Administered 2019-01-14 (×2): via INTRAVENOUS

## 2019-01-14 MED ORDER — ESMOLOL HCL 100 MG/10ML IV SOLN
INTRAVENOUS | Status: DC | PRN
  Administered 2019-01-14: 30 mg via INTRAVENOUS

## 2019-01-14 MED ORDER — LACTATED RINGERS IV SOLN
INTRAVENOUS | Status: AC
  Administered 2019-01-14 – 2019-01-15 (×3): via INTRAVENOUS

## 2019-01-14 MED ORDER — LIDOCAINE 2% (20 MG/ML) 5 ML SYRINGE
INTRAMUSCULAR | Status: DC | PRN
  Administered 2019-01-14: 100 mg via INTRAVENOUS

## 2019-01-14 MED ORDER — ONDANSETRON HCL 4 MG/2ML IJ SOLN
INTRAMUSCULAR | Status: DC | PRN
  Administered 2019-01-14: 4 mg via INTRAVENOUS

## 2019-01-14 MED ORDER — ROCURONIUM BROMIDE 10 MG/ML (PF) SYRINGE
PREFILLED_SYRINGE | INTRAVENOUS | Status: AC
  Filled 2019-01-14: qty 10

## 2019-01-14 MED ORDER — DEXAMETHASONE SODIUM PHOSPHATE 10 MG/ML IJ SOLN
INTRAMUSCULAR | Status: AC
  Filled 2019-01-14: qty 1

## 2019-01-14 MED ORDER — PHENYLEPHRINE 40 MCG/ML (10ML) SYRINGE FOR IV PUSH (FOR BLOOD PRESSURE SUPPORT)
PREFILLED_SYRINGE | INTRAVENOUS | Status: AC
  Filled 2019-01-14: qty 20

## 2019-01-14 MED ORDER — ONDANSETRON HCL 4 MG/2ML IJ SOLN
4.0000 mg | Freq: Four times a day (QID) | INTRAMUSCULAR | Status: DC | PRN
  Administered 2019-01-15 – 2019-02-07 (×14): 4 mg via INTRAVENOUS
  Filled 2019-01-14 (×15): qty 2

## 2019-01-14 MED ORDER — FAMOTIDINE IN NACL 20-0.9 MG/50ML-% IV SOLN
20.0000 mg | Freq: Once | INTRAVENOUS | Status: AC
  Administered 2019-01-14: 20 mg via INTRAVENOUS
  Filled 2019-01-14: qty 50

## 2019-01-14 MED ORDER — FENTANYL CITRATE (PF) 100 MCG/2ML IJ SOLN
25.0000 ug | INTRAMUSCULAR | Status: DC | PRN
  Administered 2019-01-16 – 2019-01-21 (×5): 25 ug via INTRAVENOUS
  Filled 2019-01-14 (×6): qty 2

## 2019-01-14 MED ORDER — PROPOFOL 10 MG/ML IV BOLUS
INTRAVENOUS | Status: DC | PRN
  Administered 2019-01-14: 80 mg via INTRAVENOUS
  Administered 2019-01-14: 40 mg via INTRAVENOUS

## 2019-01-14 MED ORDER — SUGAMMADEX SODIUM 200 MG/2ML IV SOLN
INTRAVENOUS | Status: AC
  Filled 2019-01-14: qty 2

## 2019-01-14 MED ORDER — HYDROMORPHONE HCL 1 MG/ML IJ SOLN
INTRAMUSCULAR | Status: AC
  Administered 2019-01-14: 0.5 mg via INTRAVENOUS
  Filled 2019-01-14: qty 1

## 2019-01-14 MED ORDER — HYDROMORPHONE HCL 1 MG/ML IJ SOLN
INTRAMUSCULAR | Status: AC
  Filled 2019-01-14: qty 1

## 2019-01-14 MED ORDER — MEPERIDINE HCL 50 MG/ML IJ SOLN: 6.2500 mg | INTRAMUSCULAR | Status: DC | PRN

## 2019-01-14 MED ORDER — ONDANSETRON HCL 4 MG/2ML IJ SOLN
INTRAMUSCULAR | Status: AC
  Filled 2019-01-14: qty 2

## 2019-01-14 MED ORDER — HEPARIN SODIUM (PORCINE) 5000 UNIT/ML IJ SOLN
5000.0000 [IU] | Freq: Three times a day (TID) | INTRAMUSCULAR | Status: DC
  Administered 2019-01-15 – 2019-02-11 (×79): 5000 [IU] via SUBCUTANEOUS
  Filled 2019-01-14 (×72): qty 1

## 2019-01-14 MED ORDER — PHENYLEPHRINE HCL (PRESSORS) 10 MG/ML IV SOLN
INTRAVENOUS | Status: DC | PRN
  Administered 2019-01-14: 80 ug via INTRAVENOUS
  Administered 2019-01-14: 40 ug via INTRAVENOUS

## 2019-01-14 MED ORDER — EPHEDRINE 5 MG/ML INJ
INTRAVENOUS | Status: AC
  Filled 2019-01-14: qty 10

## 2019-01-14 MED ORDER — LIDOCAINE 2% (20 MG/ML) 5 ML SYRINGE
INTRAMUSCULAR | Status: AC
  Filled 2019-01-14: qty 5

## 2019-01-14 MED ORDER — SODIUM CHLORIDE 0.9 % IV SOLN
INTRAVENOUS | Status: AC
  Filled 2019-01-14: qty 2

## 2019-01-14 MED ORDER — PROPOFOL 10 MG/ML IV BOLUS
INTRAVENOUS | Status: AC
  Filled 2019-01-14: qty 40

## 2019-01-14 MED ORDER — FENTANYL CITRATE (PF) 100 MCG/2ML IJ SOLN
INTRAMUSCULAR | Status: DC | PRN
  Administered 2019-01-14: 75 ug via INTRAVENOUS
  Administered 2019-01-14 (×3): 25 ug via INTRAVENOUS

## 2019-01-14 MED ORDER — SUCCINYLCHOLINE CHLORIDE 200 MG/10ML IV SOSY
PREFILLED_SYRINGE | INTRAVENOUS | Status: AC
  Filled 2019-01-14: qty 10

## 2019-01-14 MED ORDER — SUCCINYLCHOLINE CHLORIDE 200 MG/10ML IV SOSY
PREFILLED_SYRINGE | INTRAVENOUS | Status: DC | PRN
  Administered 2019-01-14: 120 mg via INTRAVENOUS

## 2019-01-14 MED ORDER — SUGAMMADEX SODIUM 200 MG/2ML IV SOLN
INTRAVENOUS | Status: DC | PRN
  Administered 2019-01-14: 200 mg via INTRAVENOUS

## 2019-01-14 SURGICAL SUPPLY — 33 items
BLADE CLIPPER SURG (BLADE) IMPLANT
BLADE EXTENDED COATED 6.5IN (ELECTRODE) IMPLANT
CHLORAPREP W/TINT 26 (MISCELLANEOUS) ×3 IMPLANT
COVER WAND RF STERILE (DRAPES) IMPLANT
DERMABOND ADVANCED (GAUZE/BANDAGES/DRESSINGS) ×2
DERMABOND ADVANCED .7 DNX12 (GAUZE/BANDAGES/DRESSINGS) ×1 IMPLANT
DRAPE LAPAROSCOPIC ABDOMINAL (DRAPES) ×3 IMPLANT
DRAPE WARM FLUID 44X44 (DRAPES) ×3 IMPLANT
DRSG OPSITE POSTOP 4X10 (GAUZE/BANDAGES/DRESSINGS) ×3 IMPLANT
ELECT REM PT RETURN 15FT ADLT (MISCELLANEOUS) ×3 IMPLANT
GAUZE SPONGE 4X4 12PLY STRL (GAUZE/BANDAGES/DRESSINGS) ×3 IMPLANT
GLOVE BIO SURGEON STRL SZ7.5 (GLOVE) ×3 IMPLANT
GOWN STRL REUS W/TWL LRG LVL3 (GOWN DISPOSABLE) ×9 IMPLANT
HANDLE SUCTION POOLE (INSTRUMENTS) ×1 IMPLANT
KIT BASIN OR (CUSTOM PROCEDURE TRAY) ×3 IMPLANT
KIT TURNOVER KIT A (KITS) IMPLANT
NS IRRIG 1000ML POUR BTL (IV SOLUTION) ×6 IMPLANT
PACK GENERAL/GYN (CUSTOM PROCEDURE TRAY) ×3 IMPLANT
PENCIL SMOKE EVACUATOR (MISCELLANEOUS) ×3 IMPLANT
PROTECTOR NERVE ULNAR (MISCELLANEOUS) ×3 IMPLANT
SPONGE LAP 18X18 RF (DISPOSABLE) IMPLANT
STAPLER VISISTAT 35W (STAPLE) ×3 IMPLANT
SUCTION POOLE HANDLE (INSTRUMENTS) ×3
SUT PDS AB 1 CTX 36 (SUTURE) ×6 IMPLANT
SUT PDS AB 1 TP1 96 (SUTURE) ×6 IMPLANT
SUT SILK 2 0 (SUTURE) ×2
SUT SILK 2 0 SH CR/8 (SUTURE) ×3 IMPLANT
SUT SILK 2-0 30XBRD TIE 12 (SUTURE) ×1 IMPLANT
SUT SILK 3 0 12 30 (SUTURE) ×3 IMPLANT
SUT SILK 3 0 SH CR/8 (SUTURE) ×3 IMPLANT
TOWEL OR 17X26 10 PK STRL BLUE (TOWEL DISPOSABLE) ×6 IMPLANT
TRAY FOLEY MTR SLVR 16FR STAT (SET/KITS/TRAYS/PACK) IMPLANT
YANKAUER SUCT BULB TIP NO VENT (SUCTIONS) IMPLANT

## 2019-01-14 NOTE — Anesthesia Procedure Notes (Signed)
Procedure Name: Intubation Date/Time: 01/14/2019 11:26 AM Performed by: Silas Sacramento, CRNA Pre-anesthesia Checklist: Patient identified, Emergency Drugs available, Suction available and Patient being monitored Patient Re-evaluated:Patient Re-evaluated prior to induction Oxygen Delivery Method: Circle system utilized Preoxygenation: Pre-oxygenation with 100% oxygen Induction Type: IV induction, Rapid sequence and Cricoid Pressure applied Laryngoscope Size: Mac and 3 Grade View: Grade I Tube type: Oral Tube size: 7.0 mm Number of attempts: 1 Airway Equipment and Method: Stylet Placement Confirmation: ETT inserted through vocal cords under direct vision,  positive ETCO2 and breath sounds checked- equal and bilateral Secured at: 22 cm Tube secured with: Tape Dental Injury: Teeth and Oropharynx as per pre-operative assessment

## 2019-01-14 NOTE — Progress Notes (Signed)
PROGRESS NOTE    Christie Hinton  ZOX:[id number] DOB: [birth year] DOA: 01/12/2019 PCP: Sharren Bridge, NP   Brief Narrative: Patient is 85 year old female with history of hypertension, hyperlipidemia, GERD who presented initially to med [facility] with complaints of generalized abdominal pain that started after dinner, vomiting.  Patient reported lower abdominal pain which progressively got worse and also developed nausea and vomiting after that.  Last bowel movement was 2 days ago before admission.  CT abdomen showed small bowel obstruction with dilated loops of small bowel.  Findings concerning for closed-loop obstruction.  General surgery consulted.   Undergoing exploratory laparotomy with lysis of adhesions today. Assessment & Plan:   Principal Problem:   SBO (small bowel obstruction) (HCC) Active Problems:   HTN (hypertension)   Hyperlipidemia   SBO: CT finding as above.  Not passing gas, no bowel movement.  NG tube draining dark fluid.  Continue pain management, IV fluids, antiemetics.  General surgery following.  Undergoing exploratory laparotomy with lysis of adhesions today. Plan is to start on TPN.  Hypokalemia: Supplemented with potassium  Hypertension: Currently blood pressure stable.  Continue PRN meds  Hyperlipidemia: On Lipitor at home  Hypothyroidism: Continue IV Synthyroid.         DVT prophylaxis: heparin [place] Code Status: Full Family Communication: Cussed with son on 01/13/2019 Disposition Plan: Home after surgical clearance   Consultants: General surgery  Procedures: None  Antimicrobials:  Anti-infectives (From admission, onward)   Start     Dose/Rate Route Frequency Ordered Stop   01/14/19 1103  sodium chloride 0.9 % with cefoTEtan (CEFOTAN) ADS Med    Note to Pharmacy:  Vevelyn Royals  : cabinet override      01/14/19 1103 01/14/19 1124   01/14/19 1045  cefoTEtan (CEFOTAN) 2 g in sodium chloride 0.9 % 100 mL IVPB     2 g 200 mL/hr over  30 Minutes Intravenous  Once 01/14/19 1044 01/14/19 1124      Subjective: Patient seen and examined the bedside this morning.  Looked comfortable.  Was talking on her phone.  Abdominal pain is better.  NG tube still draining dark fluid.  Not passing gas or stool.  General surgery planned for exploratory laparotomy today.  Objective: Vitals:   01/13/19 2147 01/14/19 0521 01/14/19 1032 01/14/19 1224  BP: 130/84 (!) 151/93 (!) 143/86 (!) 166/86  Pulse: 91 88 83 75  Resp: Temp: 98 F (36.7 C) 98.7 F (37.1 C) 98 F (36.7 C) 97.8 F (36.6 C)  TempSrc: Oral Oral Oral   SpO2: 95% 92% 94% 97%  Weight:      Height:        Intake/Output Summary (Last 24 hours) at 01/14/2019 1326 Last data filed at 01/14/2019 1226 Gross per 24 hour  Intake 3264.29 ml  Output 2010 ml  Net 1254.29 ml   Filed Weights   01/12/19 0929  Weight: 67.6 kg    Examination:  General exam: Not in distress, pleasant elderly female HEENT:PERRL,Oral mucosa moist, Ear/Nose normal on gross exam, NG tube Respiratory system: Bilateral equal air entry, normal vesicular breath sounds, no wheezes or crackles  Cardiovascular system: S1 & S2 heard, RRR. No JVD, murmurs, rubs, gallops or clicks. No pedal edema. Gastrointestinal system: Abdomen is nondistended, soft .  Mild diffuse tenderness.  No bowel sounds heard. Central nervous system: Alert and oriented. No focal neurological deficits. Extremities: No edema, no clubbing ,no cyanosis, distal peripheral pulses palpable. Skin: No rashes, lesions  or ulcers,no icterus ,no pallor MSK: Normal muscle bulk,tone ,power Psychiatry: Judgement and insight appear normal. Mood & affect appropriate.     Data Reviewed: I have personally reviewed following labs and imaging studies  CBC: Recent Labs  Lab 01/12/19 0928 01/13/19 0320 01/14/19 0342  WBC 9.6 11.7* 13.6*  NEUTROABS 8.1*  --   --   HGB 11.5* 12.4 12.8  HCT 37.7 40.0 40.4  MCV 82.7 84.2 82.1  PLT 349  349 379   Basic Metabolic Panel: Recent Labs  Lab 01/12/19 0928 01/13/19 0320 01/14/19 0342  NA 135 134* 131*  K 3.2* 3.3* 3.6  CL 95* 96* 96*  CO2 26 26 25   GLUCOSE 230* 253* 196*  BUN 26* 27* 37*  CREATININE 1.09* 0.87 0.89  CALCIUM 9.6 8.9 8.8*  MG  --  2.2  --    GFR: Estimated Creatinine Clearance: 40.9 mL/min (by C-G formula based on SCr of 0.89 mg/dL). Liver Function Tests: Recent Labs  Lab 01/12/19 0928  AST 21  ALT 16  ALKPHOS 61  BILITOT 0.7  PROT 7.5  ALBUMIN 4.4   Recent Labs  Lab 01/12/19 0928  LIPASE 29   No results for input(s): AMMONIA in the last 168 hours. Coagulation Profile: No results for input(s): INR, PROTIME in the last 168 hours. Cardiac Enzymes: Recent Labs  Lab 01/12/19 0928  TROPONINI <0.03   BNP (last 3 results) No results for input(s): PROBNP in the last 8760 hours. HbA1C: Recent Labs    01/14/19 0342  HGBA1C 6.6*   CBG: Recent Labs  Lab 01/13/19 2323 01/14/19 0347 01/14/19 0808 01/14/19 1037 01/14/19 1240  GLUCAP 195* 190* 182* 162* 167*   Lipid Profile: No results for input(s): CHOL, HDL, LDLCALC, TRIG, CHOLHDL, LDLDIRECT in the last 72 hours. Thyroid Function Tests: Recent Labs    01/13/19 0320  TSH 1.056   Anemia Panel: No results for input(s): VITAMINB12, FOLATE, FERRITIN, TIBC, IRON, RETICCTPCT in the last 72 hours. Sepsis Labs: No results for input(s): PROCALCITON, LATICACIDVEN in the last 168 hours.  Recent Results (from the past 240 hour(s))  Urine culture     Status: Abnormal   Collection Time: 01/12/19  9:31 AM  Result Value Ref Range Status   Specimen Description   Final    URINE, RANDOM Performed at [facility] [place], [address] [place], [place] [address]    Special Requests   Final    NONE Performed at [facility] [place], [address] [place], [place] [address]    Culture (A)  Final    10,000 COLONIES/mL GROUP B STREP(S.AGALACTIAE)ISOLATED TESTING AGAINST S.  AGALACTIAE NOT ROUTINELY PERFORMED DUE TO PREDICTABILITY OF AMP/PEN/VAN SUSCEPTIBILITY. Performed at [facility], [address] [place], [place] [address]    Report Status 01/13/2019 FINAL  Final  SARS Coronavirus 2 (Hosp order,Performed in [facility] lab via [employer] ID)     Status: None   Collection Time: 01/12/19 11:21 AM  Result Value Ref Range Status   SARS Coronavirus 2 (Abbott ID Now) NEGATIVE NEGATIVE Final    Comment: (NOTE) Interpretive Result Comment(s): COVID 19 Positive SARS CoV 2 target nucleic acids are DETECTED. The SARS CoV 2 RNA is generally detectable in upper and lower respiratory specimens during the acute phase of infection.  Positive results are indicative of active infection with SARS CoV 2.  Clinical correlation with patient history and other diagnostic information is necessary to determine patient infection status.  Positive results  do not rule out bacterial infection or coinfection with other viruses. The expected result is Negative. COVID 19 Negative SARS CoV 2 target nucleic acids are NOT DETECTED. The SARS CoV 2 RNA is generally detectable in upper and lower respiratory specimens during the acute phase of infection.  Negative results do not preclude SARS CoV 2 infection, do not rule out coinfections with other pathogens, and should not be used as the sole basis for treatment or other patient management decisions.  Negative results must be combined with clinical  observations, patient history, and epidemiological information. The expected result is Negative. Invalid Presence or absence of SARS CoV 2 nucleic acids cannot be determined. Repeat testing was performed on the submitted specimen and repeated Invalid results were obtained.  If clinically indicated, additional testing on a new specimen with an alternate test methodology [phone]([id number]) is advised.  The SARS CoV 2 RNA is generally detectable in upper and lower respiratory specimens during the  acute phase of infection. The expected result is Negative. Fact Sheet for Patients:  http://www.graves-ford.org/https://www.fda.gov/media/136524/download Fact Sheet for Healthcare Providers: EnviroConcern.sihttps://www.fda.gov/media/136523/download This test is not yet approved or cleared by the [place] FDA and has been authorized for detection and/or diagnosis of SARS CoV 2 by FDA under an Emergency Use Authorization (EUA).  This EUA will remain in effect (meaning this test can be used) for the duration of the COVID19 d eclaration under Section 564(b)(1) of the Act, 21 U.S.C. section [id number] 3(b)(1), unless the authorization is terminated or revoked sooner. Performed at [facility] [place], [address] [place], [place] [address]   SARS Coronavirus 2 (CEPHEID - Performed in [facility] hospital lab), Hosp Order     Status: None   Collection Time: 01/12/19  8:05 PM  Result Value Ref Range Status   SARS Coronavirus 2 NEGATIVE NEGATIVE Final    Comment: (NOTE) If result is NEGATIVE SARS-CoV-2 target nucleic acids are NOT DETECTED. The SARS-CoV-2 RNA is generally detectable in upper and lower  respiratory specimens during the acute phase of infection. The lowest  concentration of SARS-CoV-2 viral copies this assay can detect is 250  copies / mL. A negative result does not preclude SARS-CoV-2 infection  and should not be used as the sole basis for treatment or other  patient management decisions.  A negative result may occur with  improper specimen collection / handling, submission of specimen other  than nasopharyngeal swab, presence of viral mutation(s) within the  areas targeted by this assay, and inadequate number of viral copies  (<250 copies / mL). A negative result must be combined with clinical  observations, patient history, and epidemiological information. If result is POSITIVE SARS-CoV-2 target nucleic acids are DETECTED. The SARS-CoV-2 RNA is generally detectable in upper and lower  respiratory  specimens dur ing the acute phase of infection.  Positive  results are indicative of active infection with SARS-CoV-2.  Clinical  correlation with patient history and other diagnostic information is  necessary to determine patient infection status.  Positive results do  not rule out bacterial infection or co-infection with other viruses. If result is PRESUMPTIVE POSTIVE SARS-CoV-2 nucleic acids MAY BE PRESENT.   A presumptive positive result was obtained on the submitted specimen  and confirmed on repeat testing.  While 2019 novel coronavirus  (SARS-CoV-2) nucleic acids may be present in the submitted sample  additional confirmatory testing may be necessary for epidemiological  and / or clinical management purposes  to differentiate between  SARS-CoV-2 and other Sarbecovirus  currently known to infect humans.  If clinically indicated additional testing with an alternate test  methodology [phone]) is advised. The SARS-CoV-2 RNA is generally  detectable in upper and lower respiratory sp ecimens during the acute  phase of infection. The expected result is Negative. Fact Sheet for Patients:  StrictlyIdeas.no Fact Sheet for Healthcare Providers: BankingDealers.co.za This test is not yet approved or cleared by the [place] FDA and has been authorized for detection and/or diagnosis of SARS-CoV-2 by FDA under an Emergency Use Authorization (EUA).  This EUA will remain in effect (meaning this test can be used) for the duration of the COVID-19 declaration under Section 564(b)(1) of the Act, 21 U.S.C. section 360bbb-3(b)(1), unless the authorization is terminated or revoked sooner. Performed at [facility], [place] [address], [place], [place] [address]          Radiology Studies: Dg Abd Portable 1v  Result Date: 01/14/2019 CLINICAL DATA:  Small-bowel obstruction EXAM: PORTABLE ABDOMEN - 1 VIEW COMPARISON:  01/13/2019  FINDINGS: Gastric catheter is noted within the stomach. Persistent and increased small-bowel dilatation is noted when compare with the previous day. Some colonic gas is noted and stable. No free air is seen. IMPRESSION: Increasing small bowel dilatation when compared with the previous day Electronically Signed   By: Inez Catalina M.D.   On: 01/14/2019 08:46   Dg Abd Portable 1v-small Bowel Obstruction Protocol-initial, 8 Hr Delay  Result Date: 01/13/2019 CLINICAL DATA:  Small-bowel obstruction. EXAM: PORTABLE ABDOMEN - 1 VIEW COMPARISON:  01/13/2019 FINDINGS: The enteric tube projects over the gastric body. The patient's oral contrast is not well appreciated on this exam and is favored to remain within the small bowel. There are multiple dilated loops of small bowel measuring up to approximately 4.5 cm. There is no definite pneumatosis or free air. There is a moderate amount of stool in the ascending colon. The bladder appears to be partially distended with contrast. IMPRESSION: 1. Oral contrast is not well appreciated on this exam but is favored to reside within the small bowel. No definite oral contrast visualized in the colon. 2. Persistently dilated loops of small bowel measuring up to approximately 4.5 cm in diameter. 3. Enteric tube projects over the left upper quadrant. Electronically Signed   By: Constance Holster M.D.   On: 01/13/2019 20:00   Dg Abd Portable 1v-small Bowel Obstruction Protocol-initial, 8 Hr Delay  Result Date: 01/13/2019 CLINICAL DATA:  Small bowel obstruction. EXAM: PORTABLE ABDOMEN - 1 VIEW COMPARISON:  One-view abdomen 01/12/2019 FINDINGS: NG tube remains in place. Dilated loops of small bowel are slightly less prominent than on the prior study. Gas is present throughout the colon. Contrast is noted within the urinary bladder. Left hip replacement is evident. IMPRESSION: 1. Improving small bowel obstruction with NG tube in place. Electronically Signed   By: San Morelle  M.D.   On: 01/13/2019 07:42   Dg Abd Portable 1v-small Bowel Protocol-position Verification  Result Date: 01/12/2019 CLINICAL DATA:  85 year old female with history of nasogastric tube placement. Evaluate for bowel obstruction. EXAM: PORTABLE ABDOMEN - 1 VIEW COMPARISON:  CT the abdomen and pelvis 01/12/2019. FINDINGS: Multiple prominent mildly dilated loops of gas-filled small bowel in the central abdomen measuring up to 3.4 cm in diameter. A small amount of gas and stool is noted in the colon. No pneumoperitoneum on this single supine view. Nasogastric tube extends into the proximal body of the stomach. Either native contrast filling the urinary bladder, related to recent contrast enhanced  CT examination. Status post left hip arthroplasty. IMPRESSION: 1. Tip of nasogastric tube is in the proximal body of the stomach. 2. Bowel gas pattern again suggests early or partial small bowel obstruction. Electronically Signed   By: Trudie Reedaniel  Entrikin M.D.   On: 01/12/2019 19:09   [place] Ekg Site Rite  Result Date: 01/14/2019 If Site Rite image not attached, placement could not be confirmed due to current cardiac rhythm.       Scheduled Meds:  [MAR Hold] heparin  5,000 Units Subcutaneous Q8H   [MAR Hold] insulin aspart  0-15 Units Subcutaneous Q4H   [MAR Hold] levothyroxine  25 mcg Intravenous Daily   [MAR Hold] pantoprazole (PROTONIX) IV  40 mg Intravenous Q24H   Continuous Infusions:  dextrose 5 % and 0.45 % NaCl with KCl 20 mEq/L 75 mL/hr at 01/13/19 2335   lactated ringers 75 mL/hr at 01/14/19 1050     LOS: 2 days    Time spent: 35 mins.More than 50% of that time was spent in counseling and/or coordination of care.      Burnadette PopAmrit Lady Wisham, MD [facility] Pager [phone]  If 7PM-7AM, please contact night-coverage [email or web address] Password [email or web address] 01/14/2019, 1:26 PM

## 2019-01-14 NOTE — Transfer of Care (Signed)
Immediate Anesthesia Transfer of Care Note  Patient: Christie Hinton  Procedure(s) Performed: EXPLORATORY LAPAROTOMY LYSIS ADHESIONS (N/A Abdomen)  Patient Location: PACU  Anesthesia Type:General  Level of Consciousness: awake, oriented and patient cooperative  Airway & Oxygen Therapy: Patient Spontanous Breathing and Patient connected to face mask oxygen  Post-op Assessment: Report given to RN and Post -op Vital signs reviewed and stable  Post vital signs: Reviewed and stable  Last Vitals:  Vitals Value Taken Time  BP 166/86 01/14/2019 12:24 PM  Temp    Pulse 81 01/14/2019 12:28 PM  Resp 20 01/14/2019 12:28 PM  SpO2 96 % 01/14/2019 12:28 PM  Vitals shown include unvalidated device data.  Last Pain:  Vitals:   01/14/19 1032  TempSrc: Oral  PainSc:       Patients Stated Pain Goal: 2 (27/51/70 0174)  Complications: No apparent anesthesia complications

## 2019-01-14 NOTE — Progress Notes (Signed)
PHARMACY - ADULT TOTAL PARENTERAL NUTRITION CONSULT NOTE   Pharmacy Consult for TPN Indication: SBO s/p ex lap lysis adhesions  A/P: Received consult for TPN; however, was entered after NOON cutoff to be able to provide patient with TPN this PM. Will order appropriate labs for tomorrow AM and plan to start TPN tomorrow at 1800  Kara Mead 01/14/2019,12:48 PM

## 2019-01-14 NOTE — Anesthesia Preprocedure Evaluation (Signed)
Anesthesia Evaluation  Patient identified by MRN, date of birth, ID band Patient awake    Reviewed: Allergy & Precautions, NPO status , Patient's Chart, lab work & pertinent test results  Airway Mallampati: II  TM Distance: >3 FB Neck ROM: Full    Dental  (+) Dental Advisory Given   Pulmonary neg pulmonary ROS,    Pulmonary exam normal breath sounds clear to auscultation       Cardiovascular hypertension, Normal cardiovascular exam Rhythm:Regular Rate:Normal     Neuro/Psych negative neurological ROS  negative psych ROS   GI/Hepatic negative GI ROS, Neg liver ROS,   Endo/Other  diabetes  Renal/GU negative Renal ROS     Musculoskeletal negative musculoskeletal ROS (+)   Abdominal   Peds  Hematology negative hematology ROS (+)   Anesthesia Other Findings   Reproductive/Obstetrics                             Anesthesia Physical Anesthesia Plan  ASA: II and emergent  Anesthesia Plan: General   Post-op Pain Management:    Induction: Intravenous  PONV Risk Score and Plan: 4 or greater and Ondansetron, Treatment may vary due to age or medical condition and Dexamethasone  Airway Management Planned: Oral ETT  Additional Equipment:   Intra-op Plan:   Post-operative Plan: Extubation in OR  Informed Consent: I have reviewed the patients History and Physical, chart, labs and discussed the procedure including the risks, benefits and alternatives for the proposed anesthesia with the patient or authorized representative who has indicated his/her understanding and acceptance.     Dental advisory given  Plan Discussed with: CRNA  Anesthesia Plan Comments:         Anesthesia Quick Evaluation

## 2019-01-14 NOTE — Progress Notes (Signed)
Patient's son called for an update. RN gave the patient's son a brief update.

## 2019-01-14 NOTE — Progress Notes (Signed)
Subjective/Chief Complaint: Pt with no acute changes overnight KUB reviewed   Objective: Vital signs in last 24 hours: Temp:  [98 F (36.7 C)-98.7 F (37.1 C)] 98.7 F (37.1 C) (06/09 0521) Pulse Rate:  [83-91] 88 (06/09 0521) Resp:  [17-18] 17 (06/09 0521) BP: (130-151)/(84-93) 151/93 (06/09 0521) SpO2:  [92 %-96 %] 92 % (06/09 0521) Last BM Date: 01/10/19  Intake/Output from previous day: 06/08 0701 - 06/09 0700 In: 1057.1 [P.O.:150; I.V.:907.1] Out: 2150 [Urine:650; Emesis/NG output:1500] Intake/Output this shift: No intake/output data recorded.   Constitutional: No acute distress, conversant, appears states age. Eyes: Anicteric sclerae, moist conjunctiva, no lid lag Lungs: Clear to auscultation bilaterally, normal respiratory effort CV: regular rate and rhythm, no murmurs, no peripheral edema, pedal pulses 2+ GI: Soft, no masses or hepatosplenomegaly, non-tender to palpation, distended Skin: No rashes, palpation reveals normal turgor Psychiatric: appropriate judgment and insight, oriented to person, place, and time  Lab Results:  Recent Labs    01/13/19 0320 01/14/19 0342  WBC 11.7* 13.6*  HGB 12.4 12.8  HCT 40.0 40.4  PLT 349 379   BMET Recent Labs    01/13/19 0320 01/14/19 0342  NA 134* 131*  K 3.3* 3.6  CL 96* 96*  CO2 26 25  GLUCOSE 253* 196*  BUN 27* 37*  CREATININE 0.87 0.89  CALCIUM 8.9 8.8*  Studies/Results: Ct Abdomen Pelvis W Contrast  Result Date: 01/12/2019 CLINICAL DATA:  85 year old with bowel obstruction. Acute lower abdominal pain with nausea. EXAM: CT ABDOMEN AND PELVIS WITH CONTRAST TECHNIQUE: Multidetector CT imaging of the abdomen and pelvis was performed using the standard protocol following bolus administration of intravenous contrast. CONTRAST:  100mL OMNIPAQUE IOHEXOL 300 MG/ML  SOLN COMPARISON:  None. FINDINGS: Lower chest: Lung bases are clear.  No pleural effusions. Hepatobiliary: Normal appearance of the liver. Small  hypodensity in the right hepatic lobe on image 19 is probably an incidental finding. Portal venous system is patent. Normal appearance of the gallbladder without inflammatory changes. No biliary dilatation. Pancreas: Unremarkable. No pancreatic ductal dilatation or surrounding inflammatory changes. Spleen: Normal in size without focal abnormality. Adrenals/Urinary Tract: Normal adrenal glands. Urinary bladder is unremarkable but slightly limited evaluation due to artifact from left hip replacement. Normal appearance of both kidneys without hydronephrosis. Stomach/Bowel: Extensive diverticulosis in the sigmoid colon and left colon without acute colonic inflammation. Normal appendix. Moderate sized hiatal hernia. Normal appearance of the duodenum. Dilated small bowel loops in the left abdomen compatible with jejunum. There is mesenteric edema associated with these dilated small bowel loops. Small bowel loops measure up to 3.3 cm. Distal small bowel is decompressed. Small bowel transition point in the left abdomen near the mesenteric edema. Transition point best seen on the coronal reformats, sequence 5 image 42 and sagittal reformats, sequence 6, image 84. Irregularity of the mesentery and fat near the bowel obstruction and raises concern for a closed loop obstruction and possibly an internal hernia. No evidence for pneumatosis or portal air. Vascular/Lymphatic: Atherosclerotic disease in the abdominal aorta without aneurysm. No abdominopelvic lymphadenopathy. Reproductive: Uterus and bilateral adnexa are unremarkable. Other: Small amount of free fluid in the pelvis. Minimal fluid in the left paracolic gutter. Mesenteric edema in the left abdomen associated with the dilated small bowel loops. Negative for free air. Musculoskeletal: No acute bone abnormality in the spine. Extensive facet arthropathy. IMPRESSION: 1. Small bowel obstruction. Dilated loops of small bowel in the left abdomen with mesenteric edema and  well-defined transition point. Findings raise concern for a  closed loop obstruction, possibly related to an internal hernia. Small amount of fluid in the abdomen and pelvis. 2. Colonic diverticulosis without acute colonic inflammation. 3. Moderate sized hiatal hernia. These results were called by telephone at the time of interpretation on 01/12/2019 at 12:08 pm to Dr. Alvira MondayERIN SCHLOSSMAN , who verbally acknowledged these results. Electronically Signed   By: Richarda OverlieAdam  Henn M.D.   On: 01/12/2019 12:16   Dg Abd Portable 1v-small Bowel Obstruction Protocol-initial, 8 Hr Delay  Result Date: 01/13/2019 CLINICAL DATA:  Small-bowel obstruction. EXAM: PORTABLE ABDOMEN - 1 VIEW COMPARISON:  01/13/2019 FINDINGS: The enteric tube projects over the gastric body. The patient's oral contrast is not well appreciated on this exam and is favored to remain within the small bowel. There are multiple dilated loops of small bowel measuring up to approximately 4.5 cm. There is no definite pneumatosis or free air. There is a moderate amount of stool in the ascending colon. The bladder appears to be partially distended with contrast. IMPRESSION: 1. Oral contrast is not well appreciated on this exam but is favored to reside within the small bowel. No definite oral contrast visualized in the colon. 2. Persistently dilated loops of small bowel measuring up to approximately 4.5 cm in diameter. 3. Enteric tube projects over the left upper quadrant. Electronically Signed   By: Katherine Mantlehristopher  Green M.D.   On: 01/13/2019 20:00   Dg Abd Portable 1v-small Bowel Obstruction Protocol-initial, 8 Hr Delay  Result Date: 01/13/2019 CLINICAL DATA:  Small bowel obstruction. EXAM: PORTABLE ABDOMEN - 1 VIEW COMPARISON:  One-view abdomen 01/12/2019 FINDINGS: NG tube remains in place. Dilated loops of small bowel are slightly less prominent than on the prior study. Gas is present throughout the colon. Contrast is noted within the urinary bladder. Left hip replacement  is evident. IMPRESSION: 1. Improving small bowel obstruction with NG tube in place. Electronically Signed   By: Marin Robertshristopher  Mattern M.D.   On: 01/13/2019 07:42   Dg Abd Portable 1v-small Bowel Protocol-position Verification  Result Date: 01/12/2019 CLINICAL DATA:  85 year old female with history of nasogastric tube placement. Evaluate for bowel obstruction. EXAM: PORTABLE ABDOMEN - 1 VIEW COMPARISON:  CT the abdomen and pelvis 01/12/2019. FINDINGS: Multiple prominent mildly dilated loops of gas-filled small bowel in the central abdomen measuring up to 3.4 cm in diameter. A small amount of gas and stool is noted in the colon. No pneumoperitoneum on this single supine view. Nasogastric tube extends into the proximal body of the stomach. Either native contrast filling the urinary bladder, related to recent contrast enhanced CT examination. Status post left hip arthroplasty. IMPRESSION: 1. Tip of nasogastric tube is in the proximal body of the stomach. 2. Bowel gas pattern again suggests early or partial small bowel obstruction. Electronically Signed   By: Trudie Reedaniel  Entrikin M.D.   On: 01/12/2019 19:09     Assessment/Plan: HTN HLD Hypothyroidism Hyperglycemia - start SSI, check A1c Hiatal hernia  SBO - no prior h/o abdominal surgery - CT scan 6/7 shows SBO with dilated loops of small bowel in the left abdomen with mesenteric edema and well-defined transition point; possible closed loop obstruction  ID - none FEN - IVF (add K to IVF), NPO/NGT to LIWS VTE - SCDs, sq heparin Foley - none Follow up - TBD Contact - co-POA's are her children Aurea GraffJoan and Link Snufferddie [phone]  Plan - KUB not any better and may be somewhat worse today.  I do not think this will resolved non operatively.  Will plan OR later  today for ex lap.  I called her son Ludwig Clarks and discussed the situation with him and plans for the OR.   LOS: 2 days    Ralene Ok 01/14/2019

## 2019-01-14 NOTE — Op Note (Signed)
01/14/2019  12:03 PM  PATIENT:  Christie Hinton  85 y.o. female  PRE-OPERATIVE DIAGNOSIS:  bowel obstruction  POST-OPERATIVE DIAGNOSIS: Small Bowel Obstruction Due To Omental Adhesion, internal hernia  PROCEDURE:  Procedure(s): EXPLORATORY LAPAROTOMY LYSIS ADHESIONS (N/A)  SURGEON:  Surgeon(s) and Role:    Ralene Ok, MD - Primary  PHYSICIAN ASSISTANT: Modena Jansky, PA-C  ANESTHESIA:   general  EBL:  minimal   BLOOD ADMINISTERED:none  DRAINS: none   LOCAL MEDICATIONS USED:  NONE  SPECIMEN:  No Specimen  DISPOSITION OF SPECIMEN:  N/A  COUNTS:  YES  TOURNIQUET:  * No tourniquets in log *  DICTATION: .Dragon Dictation  Indication procedure: Patient is a 85 year old female with small bowel obstruction.  Patient was admitted and underwent NG tube, SBO protocol.  This remains resolved secondary to continued obstruction patient was taken back to the operating urgently for exploratory laparotomy.  Findings: Patient had an area of dilated loops of small bowel to the left side of her abdomen.  There was 1 omental adhesion that was tethered down to the retroperitoneum on the left side.  His previous internal hernia.  This was lysed and the bowel was freed up.  The bowel was hemorrhagic however this was nonischemic and appeared viable.  Details of procedure: After the patient was consented she was taken back to the OR and placed in supine position with bilateral SCDs.  She underwent general endotracheal anesthesia.  Patient had Foley catheter placed as appropriate in standard fashion.  Timeout was called and all facts verified.  A #10 blade was used to make an upper midline incision.  Cautery was used to maintain hemostasis and dissection was taken down to the linea alba.  This was incised.  This was bluntly.  A large amount of serosanguineous ascites evacuated.  The retractor was used to elevate right-sided abdominal wall.  Large amount of ischemic bowel.  It was apparent  there was an area in the left retroperitoneum.  This appeared to be an area junction.  There was small bowel that was questionably this pain.  This appeared to be an internal hernia.  The omental adhesion was lysed.  This freed up the bowel.  The bowel that was within the internal hernia to be hemorrhagic however was not ischemic.  This time the abdominal cavity was irrigated out with sterile saline.  The midline fascia was then reapproximated using #1 running PDS in a standard fashion.  Skin was reapproximated with skin staples.  Honeycomb dressing placed.  Patient taught the procedure well was taken to the recovery in stable condition.   PLAN OF CARE: Admit to inpatient   PATIENT DISPOSITION:  PACU - hemodynamically stable.   Delay start of Pharmacological VTE agent (>24hrs) due to surgical blood loss or risk of bleeding: no

## 2019-01-15 ENCOUNTER — Inpatient Hospital Stay (HOSPITAL_COMMUNITY): Payer: Medicare Other

## 2019-01-15 ENCOUNTER — Encounter (HOSPITAL_COMMUNITY): Payer: Self-pay | Admitting: General Surgery

## 2019-01-15 LAB — CBC
HCT: 35.3 % — ABNORMAL LOW (ref 36.0–46.0)
Hemoglobin: 11.1 g/dL — ABNORMAL LOW (ref 12.0–15.0)
MCH: 26.2 pg (ref 26.0–34.0)
MCHC: 31.4 g/dL (ref 30.0–36.0)
MCV: 83.3 fL (ref 80.0–100.0)
Platelets: 305 10*3/uL (ref 150–400)
RBC: 4.24 MIL/uL (ref 3.87–5.11)
RDW: 15.7 % — ABNORMAL HIGH (ref 11.5–15.5)
WBC: 6.8 10*3/uL (ref 4.0–10.5)
nRBC: 0 % (ref 0.0–0.2)

## 2019-01-15 LAB — COMPREHENSIVE METABOLIC PANEL
ALT: 13 U/L (ref 0–44)
AST: 17 U/L (ref 15–41)
Albumin: 2.6 g/dL — ABNORMAL LOW (ref 3.5–5.0)
Alkaline Phosphatase: 39 U/L (ref 38–126)
Anion gap: 9 (ref 5–15)
BUN: 37 mg/dL — ABNORMAL HIGH (ref 8–23)
CO2: 24 mmol/L (ref 22–32)
Calcium: 8.2 mg/dL — ABNORMAL LOW (ref 8.9–10.3)
Chloride: 101 mmol/L (ref 98–111)
Creatinine, Ser: 0.85 mg/dL (ref 0.44–1.00)
GFR calc Af Amer: >60 mL/min (ref >60)
GFR calc non Af Amer: >60 mL/min (ref >60)
Glucose, Bld: 125 mg/dL — ABNORMAL HIGH (ref 70–99)
Potassium: 3.9 mmol/L (ref 3.5–5.1)
Sodium: 134 mmol/L — ABNORMAL LOW (ref 135–145)
Total Bilirubin: 0.2 mg/dL — ABNORMAL LOW (ref 0.3–1.2)
Total Protein: 5.5 g/dL — ABNORMAL LOW (ref 6.5–8.1)

## 2019-01-15 LAB — GLUCOSE, CAPILLARY
Glucose-Capillary: 137 mg/dL — ABNORMAL HIGH (ref 70–99)
Glucose-Capillary: 137 mg/dL — ABNORMAL HIGH (ref 70–99)
Glucose-Capillary: 122 mg/dL — ABNORMAL HIGH (ref 70–99)
Glucose-Capillary: 109 mg/dL — ABNORMAL HIGH (ref 70–99)
Glucose-Capillary: 149 mg/dL — ABNORMAL HIGH (ref 70–99)
Glucose-Capillary: 163 mg/dL — ABNORMAL HIGH (ref 70–99)
Glucose-Capillary: 152 mg/dL — ABNORMAL HIGH (ref 70–99)

## 2019-01-15 LAB — DIFFERENTIAL
Abs Immature Granulocytes: 0.01 10*3/uL (ref 0.00–0.07)
Basophils Absolute: 0 10*3/uL (ref 0.0–0.1)
Basophils Relative: 0 %
Eosinophils Absolute: 0 10*3/uL (ref 0.0–0.5)
Eosinophils Relative: 0 %
Immature Granulocytes: 0 %
Lymphocytes Relative: 10 %
Lymphs Abs: 0.7 10*3/uL (ref 0.7–4.0)
Monocytes Absolute: 0.6 10*3/uL (ref 0.1–1.0)
Monocytes Relative: 9 %
Neutro Abs: 5.4 10*3/uL (ref 1.7–7.7)
Neutrophils Relative %: 81 %

## 2019-01-15 LAB — PREALBUMIN: Prealbumin: 8.4 mg/dL — ABNORMAL LOW (ref 18–38)

## 2019-01-15 LAB — MAGNESIUM: Magnesium: 2.2 mg/dL (ref 1.7–2.4)

## 2019-01-15 LAB — TRIGLYCERIDES: Triglycerides: 63 mg/dL (ref <150)

## 2019-01-15 LAB — PHOSPHORUS: Phosphorus: 3.3 mg/dL (ref 2.5–4.6)

## 2019-01-15 MED ORDER — IOHEXOL 300 MG/ML  SOLN
50.0000 mL | Freq: Once | INTRAMUSCULAR | Status: AC | PRN
  Administered 2019-01-15: 50 mL via ORAL

## 2019-01-15 MED ORDER — LIDOCAINE HCL URETHRAL/MUCOSAL 2 % EX GEL
CUTANEOUS | Status: AC
  Filled 2019-01-15: qty 30

## 2019-01-15 MED ORDER — SODIUM CHLORIDE 0.9% FLUSH
10.0000 mL | INTRAVENOUS | Status: DC | PRN
  Administered 2019-01-20: 10 mL
  Filled 2019-01-15: qty 40

## 2019-01-15 MED ORDER — FAMOTIDINE IN NACL 20-0.9 MG/50ML-% IV SOLN
20.0000 mg | Freq: Every day | INTRAVENOUS | Status: DC
  Administered 2019-01-15: 20 mg via INTRAVENOUS
  Filled 2019-01-15: qty 50

## 2019-01-15 MED ORDER — TRAVASOL 10 % IV SOLN
INTRAVENOUS | Status: AC
  Administered 2019-01-15: 18:00:00 via INTRAVENOUS
  Filled 2019-01-15: qty 436.8

## 2019-01-15 MED ORDER — LACTATED RINGERS IV SOLN
INTRAVENOUS | Status: AC
  Administered 2019-01-16: 06:00:00 via INTRAVENOUS

## 2019-01-15 MED ORDER — LIP MEDEX EX OINT
TOPICAL_OINTMENT | CUTANEOUS | Status: AC
  Filled 2019-01-15: qty 7

## 2019-01-15 MED ORDER — LATANOPROST 0.005 % OP SOLN
1.0000 [drp] | Freq: Every day | OPHTHALMIC | Status: DC
  Administered 2019-01-15 – 2019-02-10 (×27): 1 [drp] via OPHTHALMIC
  Filled 2019-01-15 (×2): qty 2.5

## 2019-01-15 MED ORDER — SODIUM CHLORIDE 0.9% FLUSH
10.0000 mL | Freq: Two times a day (BID) | INTRAVENOUS | Status: DC
  Administered 2019-01-16 – 2019-02-11 (×12): 10 mL

## 2019-01-15 NOTE — Progress Notes (Signed)
PROGRESS NOTE    Christie Hinton  WUJ:[id number] DOB: 07-03-1928 DOA: 01/12/2019 PCP: Sharren BridgeSellers, Billie H, NP   Brief Narrative: Patient is 85 year old female with history of hypertension, hyperlipidemia, GERD who [facility] [place] with complaints of generalized abdominal pain that started after dinner, vomiting.  Patient reported lower abdominal pain which progressively got worse and also developed nausea and vomiting after that.  Last bowel movement was 2 days ago before admission.  CT abdomen showed small bowel obstruction with dilated loops of small bowel.  Findings concerning for closed-loop obstruction.  General surgery consulted.   Underwent exploratory laparotomy with lysis of adhesions on 01/14/19.  Assessment & Plan:   Principal Problem:   SBO (small bowel obstruction) (HCC) Active Problems:   HTN (hypertension)   Hyperlipidemia   SBO: CT finding as above. General surgery following.  Underwent  exploratory laparotomy with lysis of adhesions .POD 1 Continue pain management, IV fluids, antiemetics.  Continue NG tube .  Incentive spirometry, PT consult, ambulation encouraged Plan is to start on TPN.  Hypokalemia: Supplemented with potassium  Hypertension: Currently blood pressure stable.  Continue PRN meds  Hyperlipidemia: On Lipitor at home  Hypothyroidism: Continue IV Synthyroid.  Nutrition Problem: Inadequate oral intake Etiology: inability to eat      DVT prophylaxis: heparin [place] Code Status: Full Family Communication: Disussed with son on 01/13/2019 Disposition Plan: Home after surgical clearance   Consultants: General surgery  Procedures: None  Antimicrobials:  Anti-infectives (From admission, onward)   Start     Dose/Rate Route Frequency Ordered Stop   01/14/19 1103  sodium chloride 0.9 % with cefoTEtan (CEFOTAN) ADS Med    Note to Pharmacy:  Vevelyn RoyalsHarvell, Gwendolyn  : cabinet override      01/14/19 1103 01/14/19 1124   01/14/19 1045   cefoTEtan (CEFOTAN) 2 g in sodium chloride 0.9 % 100 mL IVPB     2 g 200 mL/hr over 30 Minutes Intravenous  Once 01/14/19 1044 01/14/19 1124      Subjective: Patient seen and examined the bedside this morning.  Hemodynamically stable.  Looks comfortable.  Pain well controlled.  Currently on NG tube.  Not passing flatus or stool.  Objective: Vitals:   01/15/19 0218 01/15/19 0602 01/15/19 0947 01/15/19 1323  BP: 129/77 131/78 138/76 123/74  Pulse: 79 77 80 69  Resp: 16 18 16 17   Temp: 99.2 F (37.3 C) 98.7 F (37.1 C) 98.7 F (37.1 C) 98.7 F (37.1 C)  TempSrc: Oral Oral Oral Oral  SpO2: 96% 94% 95% 97%  Weight:      Height:        Intake/Output Summary (Last 24 hours) at 01/15/2019 1358 Last data filed at 01/15/2019 1353 Gross per 24 hour  Intake 2708.05 ml  Output 1605 ml  Net 1103.05 ml   Filed Weights   01/12/19 0929  Weight: 67.6 kg    Examination:  General exam: Not in distress, elderly female  HEENT:PERRL,Oral mucosa moist, Ear/Nose normal on gross exam Respiratory system: Bilateral equal air entry, normal vesicular breath sounds, no wheezes or crackles  Cardiovascular system: S1 & S2 heard, RRR. No JVD, murmurs, rubs, gallops or clicks. Gastrointestinal system: Abdomen is mildly distended, soft , mild generalized tenderness.No bowel sounds heard.  Clean surgical wound. Central nervous system: Alert and oriented. No focal neurological deficits. Extremities: No edema, no clubbing ,no cyanosis, distal peripheral pulses palpable. Skin: No rashes, lesions or ulcers,no icterus ,no pallor    Data Reviewed: I have  personally reviewed following labs and imaging studies  CBC: Recent Labs  Lab 01/12/19 0928 01/13/19 0320 01/14/19 0342 01/15/19 0522  WBC 9.6 11.7* 13.6* 6.8  NEUTROABS 8.1*  --   --  5.4  HGB 11.5* 12.4 12.8 11.1*  HCT 37.7 40.0 40.4 35.3*  MCV 82.7 84.2 82.1 83.3  PLT 349 349 379 305   Basic Metabolic Panel: Recent Labs  Lab 01/12/19 0928  01/13/19 0320 01/14/19 0342 01/15/19 0522  NA 135 134* 131* 134*  K 3.2* 3.3* 3.6 3.9  CL 95* 96* 96* 101  CO2 26 26 25 24   GLUCOSE 230* 253* 196* 125*  BUN 26* 27* 37* 37*  CREATININE 1.09* 0.87 0.89 0.85  CALCIUM 9.6 8.9 8.8* 8.2*  MG  --  2.2  --  2.2  PHOS  --   --   --  3.3   GFR: Estimated Creatinine Clearance: 42.9 mL/min (by C-G formula based on SCr of 0.85 mg/dL). Liver Function Tests: Recent Labs  Lab 01/12/19 0928 01/15/19 0522  AST 21 17  ALT 16 13  ALKPHOS 61 39  BILITOT 0.7 0.2*  PROT 7.5 5.5*  ALBUMIN 4.4 2.6*   Recent Labs  Lab 01/12/19 0928  LIPASE 29   No results for input(s): AMMONIA in the last 168 hours. Coagulation Profile: No results for input(s): INR, PROTIME in the last 168 hours. Cardiac Enzymes: Recent Labs  Lab 01/12/19 0928  TROPONINI <0.03   BNP (last 3 results) No results for input(s): PROBNP in the last 8760 hours. HbA1C: Recent Labs    01/14/19 0342  HGBA1C 6.6*   CBG: Recent Labs  Lab 01/14/19 2342 01/15/19 0340 01/15/19 0354 01/15/19 0734 01/15/19 1148  GLUCAP 111* 137* 137* 122* 109*   Lipid Profile: Recent Labs    01/15/19 0522  TRIG 63   Thyroid Function Tests: Recent Labs    01/13/19 0320  TSH 1.056   Anemia Panel: No results for input(s): VITAMINB12, FOLATE, FERRITIN, TIBC, IRON, RETICCTPCT in the last 72 hours. Sepsis Labs: No results for input(s): PROCALCITON, LATICACIDVEN in the last 168 hours.  Recent Results (from the past 240 hour(s))  Urine culture     Status: Abnormal   Collection Time: 01/12/19  9:31 AM  Result Value Ref Range Status   Specimen Description   Final    URINE, RANDOM Performed at [facility] [place], [address] [place], [place] [address]    Special Requests   Final    NONE Performed at [facility] [place], [address] [place], [place] [address]    Culture (A)  Final    10,000 COLONIES/mL GROUP B STREP(S.AGALACTIAE)ISOLATED TESTING AGAINST S.  AGALACTIAE NOT ROUTINELY PERFORMED DUE TO PREDICTABILITY OF AMP/PEN/VAN SUSCEPTIBILITY. Performed at [facility], [address] [place], [place] [address]    Report Status 01/13/2019 FINAL  Final  SARS Coronavirus 2 (Hosp order,Performed in [facility] lab via [employer] ID)     Status: None   Collection Time: 01/12/19 11:21 AM  Result Value Ref Range Status   SARS Coronavirus 2 (Abbott ID Now) NEGATIVE NEGATIVE Final    Comment: (NOTE) Interpretive Result Comment(s): COVID 19 Positive SARS CoV 2 target nucleic acids are DETECTED. The SARS CoV 2 RNA is generally detectable in upper and lower respiratory specimens during the acute phase of infection.  Positive results are indicative of active infection with SARS CoV 2.  Clinical correlation with patient history and other diagnostic information is necessary  to determine patient infection status.  Positive results do not rule out bacterial infection or coinfection with other viruses. The expected result is Negative. COVID 19 Negative SARS CoV 2 target nucleic acids are NOT DETECTED. The SARS CoV 2 RNA is generally detectable in upper and lower respiratory specimens during the acute phase of infection.  Negative results do not preclude SARS CoV 2 infection, do not rule out coinfections with other pathogens, and should not be used as the sole basis for treatment or other patient management decisions.  Negative results must be combined with clinical  observations, patient history, and epidemiological information. The expected result is Negative. Invalid Presence or absence of SARS CoV 2 nucleic acids cannot be determined. Repeat testing was performed on the submitted specimen and repeated Invalid results were obtained.  If clinically indicated, additional testing on a new specimen with an alternate test methodology [phone]) is advised.  The SARS CoV 2 RNA is generally detectable in upper and lower respiratory specimens during the  acute phase of infection. The expected result is Negative. Fact Sheet for Patients:  http://www.graves-ford.org/https://www.fda.gov/media/136524/download Fact Sheet for Healthcare Providers: EnviroConcern.sihttps://www.fda.gov/media/136523/download This test is not yet approved or cleared by the [place] FDA and has been authorized for detection and/or diagnosis of SARS CoV 2 by FDA under an Emergency Use Authorization (EUA).  This EUA will remain in effect (meaning this test can be used) for the duration of the COVID19 d eclaration under Section 564(b)(1) of the Act, 21 U.S.C. section [phone] 3(b)(1), unless the authorization is terminated or revoked sooner. Performed at [facility] [place], [address] [place], [place] [address]   SARS Coronavirus 2 (CEPHEID - Performed in [facility] hospital lab), Hosp Order     Status: None   Collection Time: 01/12/19  8:05 PM  Result Value Ref Range Status   SARS Coronavirus 2 NEGATIVE NEGATIVE Final    Comment: (NOTE) If result is NEGATIVE SARS-CoV-2 target nucleic acids are NOT DETECTED. The SARS-CoV-2 RNA is generally detectable in upper and lower  respiratory specimens during the acute phase of infection. The lowest  concentration of SARS-CoV-2 viral copies this assay can detect is 250  copies / mL. A negative result does not preclude SARS-CoV-2 infection  and should not be used as the sole basis for treatment or other  patient management decisions.  A negative result may occur with  improper specimen collection / handling, submission of specimen other  than nasopharyngeal swab, presence of viral mutation(s) within the  areas targeted by this assay, and inadequate number of viral copies  (<250 copies / mL). A negative result must be combined with clinical  observations, patient history, and epidemiological information. If result is POSITIVE SARS-CoV-2 target nucleic acids are DETECTED. The SARS-CoV-2 RNA is generally detectable in upper and lower  respiratory  specimens dur ing the acute phase of infection.  Positive  results are indicative of active infection with SARS-CoV-2.  Clinical  correlation with patient history and other diagnostic information is  necessary to determine patient infection status.  Positive results do  not rule out bacterial infection or co-infection with other viruses. If result is PRESUMPTIVE POSTIVE SARS-CoV-2 nucleic acids MAY BE PRESENT.   A presumptive positive result was obtained on the submitted specimen  and confirmed on repeat testing.  While 2019 novel coronavirus  (SARS-CoV-2) nucleic acids may be present in the submitted sample  additional confirmatory testing may be necessary for epidemiological  and / or clinical management purposes  to differentiate between  SARS-CoV-2 and other Sarbecovirus currently known to infect humans.  If clinically indicated additional testing with an alternate test  methodology [phone]) is advised. The SARS-CoV-2 RNA is generally  detectable in upper and lower respiratory sp ecimens during the acute  phase of infection. The expected result is Negative. Fact Sheet for Patients:  StrictlyIdeas.no Fact Sheet for Healthcare Providers: BankingDealers.co.za This test is not yet approved or cleared by the [place] FDA and has been authorized for detection and/or diagnosis of SARS-CoV-2 by FDA under an Emergency Use Authorization (EUA).  This EUA will remain in effect (meaning this test can be used) for the duration of the COVID-19 declaration under Section 564(b)(1) of the Act, 21 U.S.C. section 360bbb-3(b)(1), unless the authorization is terminated or revoked sooner. Performed at [facility], [place] [address], [place], [place] [address]          Radiology Studies: Dg Abd 1 View  Result Date: 01/15/2019 CLINICAL DATA:  Small-bowel obstruction EXAM: ABDOMEN - 1 VIEW COMPARISON:  01/14/2019 FINDINGS:  Persistent markedly dilated small intestine consistent with ongoing small bowel obstruction. Collapsed colon containing some contrast. IMPRESSION: Persistent small bowel obstruction pattern. No visible nasogastric tube. Electronically Signed   By: Nelson Chimes M.D.   On: 01/15/2019 11:20   X-ray Abdomen Ap  Result Date: 01/14/2019 CLINICAL DATA:  Nasogastric tube placement. EXAM: ABDOMEN - 1 VIEW COMPARISON:  Radiograph of same day. FINDINGS: Slightly decreased small bowel dilatation is noted. These findings remain suspicious for distal small bowel obstruction. Midline surgical staples are noted. Nasogastric tube is seen looped within probable hiatal hernia above the diaphragm. IMPRESSION: Nasogastric tube tip appears to be looped within the hiatal hernia above the diaphragm. Slightly decreased small bowel dilatation is noted as described above. Electronically Signed   By: Marijo Conception M.D.   On: 01/14/2019 14:40   X-ray Abdomen Ap  Result Date: 01/14/2019 CLINICAL DATA:  Nasogastric tube placement. EXAM: ABDOMEN - 1 VIEW COMPARISON:  Radiograph of January 14, 2019. FINDINGS: Stable severe small bowel dilatation is noted consistent with distal small bowel obstruction. Midline surgical staples are noted. No abnormal calcifications are noted. Nasogastric tube appears to be going into the right mainstem and lower lobe bronchus. IMPRESSION: Nasogastric tube is seen going into right mainstem and lower lobe bronchus. Immediate withdrawal is recommended. Critical Value/emergent results were called by telephone at the time of interpretation on 01/14/2019 at 1:42 pm toAshura, who verbally acknowledged these results and stated that they have already removed the nasogastric tube and replaced it. Stable small bowel dilatation is noted consistent with distal small bowel obstruction. Electronically Signed   By: Marijo Conception M.D.   On: 01/14/2019 13:43   Dg Abd Portable 1v  Result Date: 01/14/2019 CLINICAL DATA:   Small-bowel obstruction EXAM: PORTABLE ABDOMEN - 1 VIEW COMPARISON:  01/13/2019 FINDINGS: Gastric catheter is noted within the stomach. Persistent and increased small-bowel dilatation is noted when compare with the previous day. Some colonic gas is noted and stable. No free air is seen. IMPRESSION: Increasing small bowel dilatation when compared with the previous day Electronically Signed   By: Inez Catalina M.D.   On: 01/14/2019 08:46   Dg Abd Portable 1v-small Bowel Obstruction Protocol-initial, 8 Hr Delay  Result Date: 01/13/2019 CLINICAL DATA:  Small-bowel obstruction. EXAM: PORTABLE ABDOMEN - 1 VIEW COMPARISON:  01/13/2019 FINDINGS: The enteric tube projects over the gastric body. The patient's oral contrast is not well appreciated on this exam  and is favored to remain within the small bowel. There are multiple dilated loops of small bowel measuring up to approximately 4.5 cm. There is no definite pneumatosis or free air. There is a moderate amount of stool in the ascending colon. The bladder appears to be partially distended with contrast. IMPRESSION: 1. Oral contrast is not well appreciated on this exam but is favored to reside within the small bowel. No definite oral contrast visualized in the colon. 2. Persistently dilated loops of small bowel measuring up to approximately 4.5 cm in diameter. 3. Enteric tube projects over the left upper quadrant. Electronically Signed   By: Katherine Mantle M.D.   On: 01/13/2019 20:00   [place] Ekg Site Rite  Result Date: 01/14/2019 If Site Rite image not attached, placement could not be confirmed due to current cardiac rhythm.       Scheduled Meds: . heparin  5,000 Units Subcutaneous Q8H  . insulin aspart  0-15 Units Subcutaneous Q4H  . latanoprost  1 drop Both Eyes QHS  . levothyroxine  25 mcg Intravenous Daily  . lip balm      . pantoprazole (PROTONIX) IV  40 mg Intravenous Q24H  . sodium chloride flush  10-40 mL Intracatheter Q12H   Continuous  Infusions: . famotidine (PEPCID) IV    . lactated ringers 75 mL/hr at 01/15/19 1000  . lactated ringers    . TPN ADULT (ION)       LOS: 3 days    Time spent: 35 mins.More than 50% of that time was spent in counseling and/or coordination of care.      Burnadette Pop, MD [facility] Pager [phone]  If 7PM-7AM, please contact night-coverage [email or web address] Password [email or web address] 01/15/2019, 1:58 PM

## 2019-01-15 NOTE — Progress Notes (Signed)
PHARMACY - ADULT TOTAL PARENTERAL NUTRITION CONSULT NOTE   Pharmacy Consult for TPN Indication: Post-operative ileus following surgery for SBO  Patient Measurements: Height: 5' 6.5" (168.9 cm) Weight: 149 lb (67.6 kg) IBW/kg (Calculated) : 60.45 TPN AdjBW (KG): 67.6 Body mass index is 23.69 kg/m.   Recent Labs    01/12/19 0928 01/13/19 0320 01/14/19 0342 01/15/19 0522  NA 135 134* 131* 134*  K 3.2* 3.3* 3.6 3.9  CL 95* 96* 96* 101  CO2 26 26 25 24   GLUCOSE 230* 253* 196* 125*  BUN 26* 27* 37* 37*  CREATININE 1.09* 0.87 0.89 0.85  CALCIUM 9.6 8.9 8.8* 8.2*  PHOS  --   --   --  3.3  MG  --  2.2  --  2.2  ALBUMIN 4.4  --   --  2.6*  ALKPHOS 61  --   --  39  AST 21  --   --  17  ALT 16  --   --  13  BILITOT 0.7  --   --  0.2*  TRIG  --   --   --  63  PREALBUMIN  --   --   --  8.4*  6/10 Corr Ca = 9.1  Insulin Requirements: 9 units SSI Novolog (on moderate scale coverage q4h).  Hx of DM (diet controlled) noted.  Current Nutrition: NPO  IVF: LR 75 mL/hr  Central access: PICC ordered by surgery 6.9; awaiting placement by IV Team TPN start date: 6.10 provided that central access is in place  ASSESSMENT                                                                                           HPI: 85 y/o F with SBO underwent laparotomy and lysis of adhesions on 6.9.20, now to begin TPN.  Hx of DM (diet-controlled) noted.   Significant events:   Today:   Glucose -  CBGs 111 - 167 in past 24 hrs. Received dexamethasone 4 mg IV preop on 6.9.     Electrolytes - WNL except Na slightly low  Renal - BUN elevated; SCr WNL  LFTs - all < ULN  TGs - WNL   Prealbumin - slightly low at baseline  NUTRITIONAL GOALS                                                                                             KCal:    1700 / day Protein: 85-90 g /day RD consult is pending  Goal TPN rate is 70 ml/hr (provides 1710 KCal/day, 87 protein/day, 1.68 L/day)  RD consult is  pending PLAN  At 1800 today, provided that central access is in place:  Start TPN at 35 ml/hr.  Reduce mIVF to 40 ml/hr  Plan to advance TPN as tolerated to goal rate.  TPN to contain standard multivitamins daily and trace elements MWF.  Continue SSI Novolog moderate scale q4h  TPN lab panels on Mondays & Thursdays.  F/u daily.  Elie Goodyandy Labarron Durnin, PharmD, BCPS [phone] 01/15/2019  10:05 AM

## 2019-01-15 NOTE — Progress Notes (Signed)
Peripherally Inserted Central Catheter/Midline Placement  The IV Nurse has discussed with the patient and/or persons authorized to consent for the patient, the purpose of this procedure and the potential benefits and risks involved with this procedure.  The benefits include less needle sticks, lab draws from the catheter, and the patient may be discharged home with the catheter. Risks include, but not limited to, infection, bleeding, blood clot (thrombus formation), and puncture of an artery; nerve damage and irregular heartbeat and possibility to perform a PICC exchange if needed/ordered by physician.  Alternatives to this procedure were also discussed.  Bard Power PICC patient education guide, fact sheet on infection prevention and patient information card has been provided to patient /or left at bedside.    PICC/Midline Placement Documentation  PICC Double Lumen 01/15/19 PICC Right Brachial 39 cm 0 cm (Active)  Indication for Insertion or Continuance of Line Administration of hyperosmolar/irritating solutions (i.e. TPN, Vancomycin, etc.) 01/15/2019 11:00 AM  Exposed Catheter (cm) 0 cm 01/15/2019 11:00 AM  Site Assessment Clean;Dry;Intact 01/15/2019 11:00 AM  Lumen #1 Status Flushed;Saline locked;Blood return noted 01/15/2019 11:00 AM  Lumen #2 Status Flushed;Saline locked;Blood return noted 01/15/2019 11:00 AM  Dressing Type Transparent;Securing device 01/15/2019 11:00 AM  Dressing Status Clean;Dry;Intact;Antimicrobial disc in place 01/15/2019 11:00 AM  Line Care Connections checked and tightened 01/15/2019 11:00 AM  Dressing Intervention New dressing 01/15/2019 11:00 AM  Dressing Change Due 01/22/19 01/15/2019 11:00 AM       Virgilio Belling 01/15/2019, 11:30 AM

## 2019-01-15 NOTE — Progress Notes (Addendum)
Physical Therapy Treatment Patient Details Name: Christie Hinton MRN: [id number] DOB: Oct 30, 1927 Today's Date: 01/15/2019    History of Present Illness 85 yo female admitted with SBO. S/P exp lap, lysis of adhesions 6/9. Hx of DM    PT Comments    Re-eval s/p surgery. Pt tolerated activity well.    Follow Up Recommendations  Home health PT;Supervision - Intermittent     Equipment Recommendations  None recommended by PT    Recommendations for Other Services       Precautions / Restrictions Precautions Precautions: Fall; NG tube  Restrictions Weight Bearing Restrictions: No;    Mobility  Bed Mobility Overal bed mobility: Needs Assistance Bed Mobility: Supine to Sit;Sit to Supine     Supine to sit: Min guard Sit to supine: Min guard   General bed mobility comments: HOB maximally elevated. Close guard for safety, lines  Transfers Overall transfer level: Needs assistance Equipment used: 4-wheeled walker Transfers: Sit to/from Stand Sit to Stand: Min guard         General transfer comment: VCs safety, technique, hand placement, proper operation of rollator. Close guard for safety.   Ambulation/Gait Ambulation/Gait assistance: Min Web designer (Feet): 500 Feet Assistive device: 4-wheeled walker Gait Pattern/deviations: Step-through pattern;Decreased stride length     General Gait Details: Intermittent assist to stabilize. Pt tolerated distance well. She is mildly unsteady.    Stairs             Wheelchair Mobility    Modified Rankin (Stroke Patients Only)       Balance Overall balance assessment: Needs assistance           Standing balance-Leahy Scale: Fair                              Cognition Arousal/Alertness: Awake/alert Behavior During Therapy: WFL for tasks assessed/performed Overall Cognitive Status: Within Functional Limits for tasks assessed                                        Exercises       General Comments        Pertinent Vitals/Pain Pain Assessment: No/denies pain    Home Living                      Prior Function            PT Goals (current goals can now be found in the care plan section) Progress towards PT goals: Progressing toward goals    Frequency    Min 3X/week      PT Plan Current plan remains appropriate    Co-evaluation              AM-PAC PT "6 Clicks" Mobility   Outcome Measure  Help needed turning from your back to your side while in a flat bed without using bedrails?: A Little Help needed moving from lying on your back to sitting on the side of a flat bed without using bedrails?: A Little Help needed moving to and from a bed to a chair (including a wheelchair)?: A Little Help needed standing up from a chair using your arms (e.g., wheelchair or bedside chair)?: A Little Help needed to walk in hospital room?: A Little Help needed climbing 3-5 steps with a railing? : A Little 6 Click Score: 18  End of Session   Activity Tolerance: Patient tolerated treatment well Patient left: in bed;with call bell/phone within reach;with bed alarm set   PT Visit Diagnosis: Unsteadiness on feet (R26.81);Muscle weakness (generalized) (M62.81)     Time: [id number] PT Time Calculation (min) (ACUTE ONLY): 25 min  Charges:  $Gait Training: 8-22 mins                        Rebeca AlertJannie Landan Fedie, PT Acute Rehabilitation Services Pager: [phone] Office: [phone]

## 2019-01-15 NOTE — Progress Notes (Signed)
Pt was gagging on tube, xray did not show it to be in stomach, called and was given ok to pull it from PA.

## 2019-01-15 NOTE — Progress Notes (Signed)
1 Day Post-Op  Subjective: Alert.  Oriented.  Stable.  Minimal distress from incisional pain.  No flatus or stool. Denies dyspnea.  Using incentive spirometry. Reasonable urine output NG output 230 cc last 24 hours Hemoglobin 11.1.  WBC 6800.  Potassium 3.9.  Creatinine 0.85.  BUN 37.  Glucose 125.  Operative events noted.  Objective: Vital signs in last 24 hours: Temp:  [97.8 F (36.6 C)-99.2 F (37.3 C)] 98.7 F (37.1 C) (06/10 0602) Pulse Rate:  [75-97] 77 (06/10 0602) Resp:  [12-22] 18 (06/10 0602) BP: (101-166)/(57-101) 131/78 (06/10 0602) SpO2:  [90 %-100 %] 94 % (06/10 0602) Last BM Date: 01/10/19  Intake/Output from previous day: 06/09 0701 - 06/10 0700 In: 3894.3 [P.O.:60; I.V.:3111.5; IV Piggyback:722.8] Out: 1065 [Urine:825; Emesis/NG output:230; Blood:10] Intake/Output this shift: Total I/O In: 1065.9 [P.O.:60; I.V.:883.1; IV Piggyback:122.8] Out: 480 [Urine:450; Emesis/NG output:30]  General appearance: Elderly but alert and oriented with normal mental status.  Mild deconditioning.  Minimal distress Resp: clear to auscultation bilaterally GI: Soft.  Somewhat distended.  Bowel sounds absent.  Midline incision intact and clean without drainage. Extremities: extremities normal, atraumatic, no cyanosis or edema  Lab Results:  Results for orders placed or performed during the hospital encounter of 01/12/19 (from the past 24 hour(s))  Glucose, capillary     Status: Abnormal   Collection Time: 01/14/19  8:08 AM  Result Value Ref Range   Glucose-Capillary 182 (H) 70 - 99 mg/dL  Glucose, capillary     Status: Abnormal   Collection Time: 01/14/19 10:37 AM  Result Value Ref Range   Glucose-Capillary 162 (H) 70 - 99 mg/dL  Type and screen [facility]     Status: None   Collection Time: 01/14/19 10:50 AM  Result Value Ref Range   ABO/RH(D) O POS    Antibody Screen NEG    Sample Expiration      01/17/2019,2359 Performed at [facility], [address] [place], [place] [address]   ABO/Rh     Status: None   Collection Time: 01/14/19 10:50 AM  Result Value Ref Range   ABO/RH(D)      O POS Performed at [facility], [address] [place], [place] [address]   Glucose, capillary     Status: Abnormal   Collection Time: 01/14/19 12:40 PM  Result Value Ref Range   Glucose-Capillary 167 (H) 70 - 99 mg/dL  Glucose, capillary     Status: Abnormal   Collection Time: 01/14/19  4:25 PM  Result Value Ref Range   Glucose-Capillary 186 (H) 70 - 99 mg/dL  Glucose, capillary     Status: Abnormal   Collection Time: 01/14/19  8:07 PM  Result Value Ref Range   Glucose-Capillary 143 (H) 70 - 99 mg/dL  Glucose, capillary     Status: Abnormal   Collection Time: 01/14/19 11:42 PM  Result Value Ref Range   Glucose-Capillary 111 (H) 70 - 99 mg/dL  Glucose, capillary     Status: Abnormal   Collection Time: 01/15/19  3:40 AM  Result Value Ref Range   Glucose-Capillary 137 (H) 70 - 99 mg/dL  Glucose, capillary     Status: Abnormal   Collection Time: 01/15/19  3:54 AM  Result Value Ref Range   Glucose-Capillary 137 (H) 70 - 99 mg/dL  CBC     Status: Abnormal   Collection Time: 01/15/19  5:22 AM  Result Value Ref Range   WBC 6.8 4.0 - 10.5 K/uL  RBC 4.24 3.87 - 5.11 MIL/uL   Hemoglobin 11.1 (L) 12.0 - 15.0 g/dL   HCT 78.235.3 (L) 95.636.0 - 21.346.0 %   MCV 83.3 80.0 - 100.0 fL   MCH 26.2 26.0 - 34.0 pg   MCHC 31.4 30.0 - 36.0 g/dL   RDW 08.615.7 (H) 57.811.5 - 46.915.5 %   Platelets 305 150 - 400 K/uL   nRBC 0.0 0.0 - 0.2 %  Comprehensive metabolic panel     Status: Abnormal   Collection Time: 01/15/19  5:22 AM  Result Value Ref Range   Sodium 134 (L) 135 - 145 mmol/L   Potassium 3.9 3.5 - 5.1 mmol/L   Chloride 101 98 - 111 mmol/L   CO2 24 22 - 32 mmol/L   Glucose, Bld 125 (H) 70 - 99 mg/dL   BUN 37 (H) 8 - 23 mg/dL   Creatinine, Ser 6.290.85 0.44 - 1.00 mg/dL   Calcium 8.2 (L) 8.9 - 10.3 mg/dL   Total Protein 5.5 (L)  6.5 - 8.1 g/dL   Albumin 2.6 (L) 3.5 - 5.0 g/dL   AST 17 15 - 41 U/L   ALT 13 0 - 44 U/L   Alkaline Phosphatase 39 38 - 126 U/L   Total Bilirubin 0.2 (L) 0.3 - 1.2 mg/dL   GFR calc non Af Amer >60 >60 mL/min   GFR calc Af Amer >60 >60 mL/min   Anion gap 9 5 - 15  Prealbumin     Status: Abnormal   Collection Time: 01/15/19  5:22 AM  Result Value Ref Range   Prealbumin 8.4 (L) 18 - 38 mg/dL  Magnesium     Status: None   Collection Time: 01/15/19  5:22 AM  Result Value Ref Range   Magnesium 2.2 1.7 - 2.4 mg/dL  Phosphorus     Status: None   Collection Time: 01/15/19  5:22 AM  Result Value Ref Range   Phosphorus 3.3 2.5 - 4.6 mg/dL  Triglycerides     Status: None   Collection Time: 01/15/19  5:22 AM  Result Value Ref Range   Triglycerides 63 <150 mg/dL  Differential     Status: None   Collection Time: 01/15/19  5:22 AM  Result Value Ref Range   Neutrophils Relative % 81 %   Neutro Abs 5.4 1.7 - 7.7 K/uL   Lymphocytes Relative 10 %   Lymphs Abs 0.7 0.7 - 4.0 K/uL   Monocytes Relative 9 %   Monocytes Absolute 0.6 0.1 - 1.0 K/uL   Eosinophils Relative 0 %   Eosinophils Absolute 0.0 0.0 - 0.5 K/uL   Basophils Relative 0 %   Basophils Absolute 0.0 0.0 - 0.1 K/uL   Immature Granulocytes 0 %   Abs Immature Granulocytes 0.01 0.00 - 0.07 K/uL     Studies/Results: X-ray Abdomen Ap  Result Date: 01/14/2019 CLINICAL DATA:  Nasogastric tube placement. EXAM: ABDOMEN - 1 VIEW COMPARISON:  Radiograph of same day. FINDINGS: Slightly decreased small bowel dilatation is noted. These findings remain suspicious for distal small bowel obstruction. Midline surgical staples are noted. Nasogastric tube is seen looped within probable hiatal hernia above the diaphragm. IMPRESSION: Nasogastric tube tip appears to be looped within the hiatal hernia above the diaphragm. Slightly decreased small bowel dilatation is noted as described above. Electronically Signed   By: Lupita RaiderJames  Green Jr M.D.   On: 01/14/2019  14:40   X-ray Abdomen Ap  Result Date: 01/14/2019 CLINICAL DATA:  Nasogastric tube placement. EXAM: ABDOMEN - 1 VIEW  COMPARISON:  Radiograph of January 14, 2019. FINDINGS: Stable severe small bowel dilatation is noted consistent with distal small bowel obstruction. Midline surgical staples are noted. No abnormal calcifications are noted. Nasogastric tube appears to be going into the right mainstem and lower lobe bronchus. IMPRESSION: Nasogastric tube is seen going into right mainstem and lower lobe bronchus. Immediate withdrawal is recommended. Critical Value/emergent results were called by telephone at the time of interpretation on 01/14/2019 at 1:42 pm toAshura, who verbally acknowledged these results and stated that they have already removed the nasogastric tube and replaced it. Stable small bowel dilatation is noted consistent with distal small bowel obstruction. Electronically Signed   By: Marijo Conception M.D.   On: 01/14/2019 13:43   Dg Abd Portable 1v  Result Date: 01/14/2019 CLINICAL DATA:  Small-bowel obstruction EXAM: PORTABLE ABDOMEN - 1 VIEW COMPARISON:  01/13/2019 FINDINGS: Gastric catheter is noted within the stomach. Persistent and increased small-bowel dilatation is noted when compare with the previous day. Some colonic gas is noted and stable. No free air is seen. IMPRESSION: Increasing small bowel dilatation when compared with the previous day Electronically Signed   By: Inez Catalina M.D.   On: 01/14/2019 08:46   [place] Ekg Site Rite  Result Date: 01/14/2019 If Site Rite image not attached, placement could not be confirmed due to current cardiac rhythm.   . heparin  5,000 Units Subcutaneous Q8H  . insulin aspart  0-15 Units Subcutaneous Q4H  . levothyroxine  25 mcg Intravenous Daily  . pantoprazole (PROTONIX) IV  40 mg Intravenous Q24H     Assessment/Plan: s/p Procedure(s): EXPLORATORY LAPAROTOMY LYSIS ADHESIONS  POD #1.  Laparotomy, lysis of adhesions for release of SBO due to  internal hernia and omental adhesions -Stable -Continue NG for expected ileus -Mobilize, ambulate, PT consult -Incentive spirometry  VTE prophylaxis -[place] heparin Hypertension Hypothyroidism -continue IV Synthroid  Appreciate management of medical problems by [facility]  @PROBHOSP @  LOS: 3 days    Christie Hinton 01/15/2019  . .prob

## 2019-01-15 NOTE — Progress Notes (Signed)
Initial Nutrition Assessment  RD working remotely.   DOCUMENTATION CODES:   Not applicable  INTERVENTION:  - TPN/TPN advancement per Pharmacy.    NUTRITION DIAGNOSIS:   Inadequate oral intake related to inability to eat as evidenced by NPO status.  GOAL:   Patient will meet greater than or equal to 90% of their needs  MONITOR:   Diet advancement, Labs, Weight trends, I & O's, Other (Comment)(TPN regimen)  REASON FOR ASSESSMENT:   Consult New TPN/TNA  ASSESSMENT:   85 year old female with history of HTN, hyperlipidemia, and GERD who presented initially to [place] with complaints of generalized abdominal pain and vomiting that started after dinner. Patient reported lower abdominal pain which progressively got worse and developed N/V after that. Last bowel movement was 2 days before admission. CT abdomen showed SBO with dilated loops of small bowel. Findings concerning for closed-loop obstruction. General surgery consulted.  Patient has been NPO since admission. POD #1 laparotomy, LOAs d/t SBO and internal hernia, omental adjesions. NGT in place with notes/flow sheet indicating ~250 ml output over the past 24 hours. Plan is to continue NGT to LIS for expected ileus. No central line or PICC at this time, but consult in to IV team for PICC placement. Plan to initiate TPN today; plan to start with custom TPN @ 35 ml/hr.   Per chart review, current weight is 149 lb and weight on 2/4 at [facility] was 142 lb. Patient reports good appetite and intakes with no recent changes until the day of admission.     Medications reviewed; sliding scale novolog, 25 mcg IV synthroid/day. Labs reviewed; CBGs: 137 x2 and 122 mg/dl today, Na: 134 mmol/l, BUN: 37 mg/dl, Ca: 8.2 mg/dl. IVF; LR @ 75 ml/hr.    NUTRITION - FOCUSED PHYSICAL EXAM:  unable to complete at this time.   Diet Order:   Diet Order            Diet NPO time specified Except for: Ice Chips  Diet effective now              EDUCATION NEEDS:   Not appropriate for education at this time  Skin:  Skin Assessment: Skin Integrity Issues: Skin Integrity Issues:: Incisions Incisions: abdomen (6/9)  Last BM:  PTA/unknown  Height:   Ht Readings from Last 1 Encounters:  01/12/19 5' 6.5" (1.689 m)    Weight:   Wt Readings from Last 1 Encounters:  01/12/19 67.6 kg    Ideal Body Weight:  60.2 kg  BMI:  Body mass index is 23.69 kg/m.  Estimated Nutritional Needs:   Kcal:  1750-2025 kcal  Protein:  80-95 grams  Fluid:  >/= 1.8 L/day     Jarome Matin, MS, RD, LDN, [facility] Inpatient Clinical Dietitian Pager # [phone] After hours/weekend pager # [phone]

## 2019-01-15 NOTE — Progress Notes (Signed)
PT Cancellation Note  Patient Details Name: Soul Deveney MRN: [id number] DOB: [birth year]   Cancelled Treatment:    Reason Eval/Treat Not Completed: Patient at procedure or test/unavailable. Pt leaving for procedure. Will check back as schedule allows.    Weston Anna, PT Acute Rehabilitation Services Pager: [phone] Office: [phone]

## 2019-01-15 NOTE — Anesthesia Postprocedure Evaluation (Signed)
Anesthesia Post Note  Patient: Christie Hinton  Procedure(s) Performed: EXPLORATORY LAPAROTOMY LYSIS ADHESIONS (N/A Abdomen)     Patient location during evaluation: PACU Anesthesia Type: General Level of consciousness: sedated and patient cooperative Pain management: pain level controlled Vital Signs Assessment: post-procedure vital signs reviewed and stable Respiratory status: spontaneous breathing Cardiovascular status: stable Anesthetic complications: no Comments: NG tube dislodged transferring patient to OR table to bed. Multiple attempts to replace in PACU    Last Vitals:  Vitals:   01/15/19 0218 01/15/19 0602  BP: 129/77 131/78  Pulse: 79 77  Resp: 16 18  Temp: 37.3 C 37.1 C  SpO2: 96% 94%    Last Pain:  Vitals:   01/15/19 0618  TempSrc:   PainSc: 0-No pain                 Nolon Nations

## 2019-01-16 ENCOUNTER — Inpatient Hospital Stay (HOSPITAL_COMMUNITY): Payer: Medicare Other

## 2019-01-16 LAB — COMPREHENSIVE METABOLIC PANEL
ALT: 13 U/L (ref 0–44)
AST: 19 U/L (ref 15–41)
Albumin: 2.6 g/dL — ABNORMAL LOW (ref 3.5–5.0)
Alkaline Phosphatase: 43 U/L (ref 38–126)
Anion gap: 9 (ref 5–15)
BUN: 35 mg/dL — ABNORMAL HIGH (ref 8–23)
CO2: 26 mmol/L (ref 22–32)
Calcium: 8.1 mg/dL — ABNORMAL LOW (ref 8.9–10.3)
Chloride: 100 mmol/L (ref 98–111)
Creatinine, Ser: 0.71 mg/dL (ref 0.44–1.00)
GFR calc Af Amer: >60 mL/min (ref >60)
GFR calc non Af Amer: >60 mL/min (ref >60)
Glucose, Bld: 155 mg/dL — ABNORMAL HIGH (ref 70–99)
Potassium: 3.7 mmol/L (ref 3.5–5.1)
Sodium: 135 mmol/L (ref 135–145)
Total Bilirubin: 0.4 mg/dL (ref 0.3–1.2)
Total Protein: 5.6 g/dL — ABNORMAL LOW (ref 6.5–8.1)

## 2019-01-16 LAB — CBC
HCT: 34.3 % — ABNORMAL LOW (ref 36.0–46.0)
Hemoglobin: 10.6 g/dL — ABNORMAL LOW (ref 12.0–15.0)
MCH: 26.1 pg (ref 26.0–34.0)
MCHC: 30.9 g/dL (ref 30.0–36.0)
MCV: 84.5 fL (ref 80.0–100.0)
Platelets: 328 10*3/uL (ref 150–400)
RBC: 4.06 MIL/uL (ref 3.87–5.11)
RDW: 16 % — ABNORMAL HIGH (ref 11.5–15.5)
WBC: 7.4 10*3/uL (ref 4.0–10.5)
nRBC: 0 % (ref 0.0–0.2)

## 2019-01-16 LAB — MAGNESIUM: Magnesium: 2.2 mg/dL (ref 1.7–2.4)

## 2019-01-16 LAB — PHOSPHORUS: Phosphorus: 1.9 mg/dL — ABNORMAL LOW (ref 2.5–4.6)

## 2019-01-16 LAB — GLUCOSE, CAPILLARY
Glucose-Capillary: 258 mg/dL — ABNORMAL HIGH (ref 70–99)
Glucose-Capillary: 141 mg/dL — ABNORMAL HIGH (ref 70–99)
Glucose-Capillary: 144 mg/dL — ABNORMAL HIGH (ref 70–99)
Glucose-Capillary: 150 mg/dL — ABNORMAL HIGH (ref 70–99)
Glucose-Capillary: 172 mg/dL — ABNORMAL HIGH (ref 70–99)
Glucose-Capillary: 168 mg/dL — ABNORMAL HIGH (ref 70–99)

## 2019-01-16 LAB — TRIGLYCERIDES: Triglycerides: 107 mg/dL (ref <150)

## 2019-01-16 MED ORDER — SODIUM PHOSPHATES 45 MMOLE/15ML IV SOLN
10.0000 mmol | Freq: Once | INTRAVENOUS | Status: AC
  Administered 2019-01-16: 10 mmol via INTRAVENOUS
  Filled 2019-01-16: qty 3.33

## 2019-01-16 MED ORDER — LACTATED RINGERS IV SOLN
INTRAVENOUS | Status: DC
  Administered 2019-01-16 – 2019-01-29 (×4): via INTRAVENOUS

## 2019-01-16 MED ORDER — ACETAMINOPHEN 10 MG/ML IV SOLN
1000.0000 mg | Freq: Four times a day (QID) | INTRAVENOUS | Status: AC
  Administered 2019-01-16 – 2019-01-17 (×4): 1000 mg via INTRAVENOUS
  Filled 2019-01-16 (×4): qty 100

## 2019-01-16 MED ORDER — TRAVASOL 10 % IV SOLN
INTRAVENOUS | Status: AC
  Administered 2019-01-16: 18:00:00 via INTRAVENOUS
  Filled 2019-01-16: qty 726

## 2019-01-16 NOTE — Progress Notes (Signed)
PROGRESS NOTE  Christie ChampionMarian Hinton ZOX:[id number] DOB: [birth year] DOA: 01/12/2019 PCP: Sharren BridgeSellers, Christie H, NP  Brief History   Patient is 85 year old female with history of hypertension, hyperlipidemia, GERD who presented initially [facility] with complaints of generalized abdominal pain that started after dinner, vomiting.  Patient reported lower abdominal pain which progressively got worse and also developed nausea and vomiting after that.  Last bowel movement was 2 days ago before admission.  CT abdomen showed small bowel obstruction with dilated loops of small bowel.  Findings concerning for closed-loop obstruction.  General surgery consulted.   Underwent exploratory laparotomy with lysis of adhesions on 01/14/19. The patient remains with NGT in place today and NPO. She is receiving TPN.  Consultants  . General surgery.  Procedures  . Exploratory laparoscopy with LOA.  Antibiotics  . None  Subjective  The patient is sitting up at bedside. No new complaints. She states that she has been passing flatus, but no stool.  Objective   Vitals:  Vitals:   01/16/19 0600 01/16/19 1348  BP: 133/70 121/68  Pulse: 79 84  Resp: 18 16  Temp: 99.1 F (37.3 C) 98.7 F (37.1 C)  SpO2: 90% 94%    Exam:  Constitutional:  . The patient is awake, alert, and oriented x 3. No acute distress. Respiratory:  . No increased work of breathing. . No wheezes, rales, or rhonchi.  . No tactile fremitus. Cardiovascular:  . Regular rate and rhythm. . No murmurs, ectopy, or gallups. . No lateral PMI. No thrills. Abdomen:  . Abdomen is soft. Non-distended. Non-tender. . No hernias, masses, or organomegaly is appreciated. Marland Kitchen. Hypoactive bowel sounds.  Musculoskeletal:  . No cyanosis, clubbing, or edema Skin:  . No rashes, lesions, ulcers . palpation of skin: no induration or nodules Neurologic:  . CN 2-12 intact . Sensation all 4 extremities intact Psychiatric:  . Mental status o Mood,  affect appropriate o Orientation to person, place, time  . judgment and insight appear intact    I have personally reviewed the following:   Today's Data  . Chemistry, CBC, vitals  Imaging  . 1 view abdomen: Stable small bowel dilatation. Post-op ileus vs persistent SBO.  Scheduled Meds: . heparin  5,000 Units Subcutaneous Q8H  . insulin aspart  0-15 Units Subcutaneous Q4H  . latanoprost  1 drop Both Eyes QHS  . levothyroxine  25 mcg Intravenous Daily  . pantoprazole (PROTONIX) IV  40 mg Intravenous Q24H  . sodium chloride flush  10-40 mL Intracatheter Q12H   Continuous Infusions: . acetaminophen 1,000 mg (01/16/19 1808)  . lactated ringers    . sodium phosphate  Dextrose 5% IVPB 10 mmol (01/16/19 1405)  . TPN ADULT (ION) 55 mL/hr at 01/16/19 1815    Principal Problem:   SBO (small bowel obstruction) s/p lysis of adhesions 01/14/2019 Active Problems:   Essential hypertension   Mixed hyperlipidemia   LOS: 4 days    A & P   SBO: CT finding as above. General surgery following.  Underwent  exploratory laparotomy with lysis of adhesions .POD 2. Pt is improving and surgery has decided to clamp NGT.  Continue pain management, IV fluids, antiemetics. Continue NG tube . Incentive spirometry, PT consult, ambulation encouraged. She continues to receive TPN which is being managed by pharmacy.   Hypokalemia: Resolved. Electrolytes managed with TPN via pharmacy.  Hypertension: Currently blood pressure stable.  Continue PRN meds  Hyperlipidemia: On Lipitor at home  Hypothyroidism: Continue IV Synthyroid.  Nutrition Problem: Inadequate oral intake Etiology: inability to eat.  I have seen and examined this patient myself. I have spent 32 minutes in her evaluation and care.  DVT prophylaxis: heparin [place] Code Status: Full Family Communication: Disussed with son on 01/13/2019 Disposition Plan: Home after surgical clearance  Christie Sanabia, DO [facility] Direct contact:  see [email or web address]  7PM-7AM contact night coverage as above 01/16/2019, 6:21 PM  LOS: 4 days

## 2019-01-16 NOTE — Plan of Care (Signed)
Patient lying in bed this morning; no complaints of pain currently. No needs expressed. Will continue to monitor.

## 2019-01-16 NOTE — Care Management Important Message (Signed)
Important Message  Patient Details IM Letter given to Servando Snare SW to present to the Patient Name: Christie Hinton MRN: [id number] Date of Birth: 04/27/1928   Medicare Important Message Given:  Yes    Kerin Salen 01/16/2019, 11:21 AM

## 2019-01-16 NOTE — Progress Notes (Signed)
PHARMACY - ADULT TOTAL PARENTERAL NUTRITION CONSULT NOTE   Pharmacy Consult for TPN Indication: Post-operative ileus following surgery for SBO  Patient Measurements: Height: 5' 6.5" (168.9 cm) Weight: 149 lb (67.6 kg) IBW/kg (Calculated) : 60.45 TPN AdjBW (KG): 67.6 Body mass index is 23.69 kg/m.   Recent Labs    01/15/19 0522 01/16/19 0522  NA 134* 135  K 3.9 3.7  CL 101 100  CO2 24 26  GLUCOSE 125* 155*  BUN 37* 35*  CREATININE 0.85 0.71  CALCIUM 8.2* 8.1*  PHOS 3.3 1.9*  MG 2.2 2.2  ALBUMIN 2.6* 2.6*  ALKPHOS 39 43  AST 17 19  ALT 13 13  BILITOT 0.2* 0.4  TRIG 63 107  PREALBUMIN 8.4*  --   6/10 Corr Ca = 9.1  Insulin Requirements: 10 units SSI since TPN started - Hx of DM (diet controlled) noted.  Current Nutrition: NPO  IVF: LR 40 mL/hr  Central access: PICC  TPN start date: 6.10  ASSESSMENT                                                                                           HPI: 85 y/o F with SBO underwent laparotomy and lysis of adhesions on 6.9.20, now to begin TPN.  Hx of DM (diet-controlled) noted.   Significant events:   Today:   Glucose (goal CBG<150) -  CBGs 141-163 while TPN was running  Electrolytes - WNL except phos went down to 1.9 from 3.3  Renal - BUN elevated; SCr WNL  LFTs - all < ULN  TGs - 107 (6/10)  Prealbumin - 8.4 (6/9)  NUTRITIONAL GOALS                                                                                             KCal:    5852-7782 / day Protein: 80-95 g /day   Goal TPN rate is 75 ml/hr (provides 1851 KCal/day, 99 g protein/day, 1.8 L/day)  RD consult is pending PLAN                                                                                                                         Now:  7mMol NaPhos x 1   At 1800 today, provided  that central access is in place:  Continue custom TPN and increase rate from 35 ml/hr to 55 ml/hr.  Reduce mIVF to [facility]  Will place standard electrolyte  concentrations in TPN except: Na at 75  Plan to advance TPN as tolerated to goal rate.  TPN to contain standard multivitamins daily and trace elements MWF.  Continue SSI Novolog moderate scale q4h  TPN lab panels on Mondays & Thursdays.  F/u daily.   Hessie KnowsJustin M Tamee Battin, PharmD, BCPS 01/16/2019 10:36 AM

## 2019-01-16 NOTE — Progress Notes (Addendum)
2 Days Post-Op    ZO:[id number]:abdominal pain  Subjective: Patient seems to be doing fairly well this a.m.  She does report some flatus.  Her abdomen does not feel overly distended.  She has a few bowel sounds.  She did walk yesterday.  Patient lives alone.  Minimal NG output.  NG tube remains in the hiatal hernia.  Objective: Vital signs in last 24 hours: Temp:  [98.6 F (37 C)-99.1 F (37.3 C)] 99.1 F (37.3 C) (06/11 0600) Pulse Rate:  [69-87] 79 (06/11 0600) Resp:  [16-18] 18 (06/11 0600) BP: (121-138)/(70-81) 133/70 (06/11 0600) SpO2:  [90 %-97 %] 90 % (06/11 0600) Last BM Date: 01/12/19  210 PO 1400 IV 800 urine 450 NG Afebrile, VSS CMP/CBC -  stable IR placed NG:  NG tube position within the large hiatal hernia. Tube could not be passed into the proximal stomach due to the anatomy of the hernia. Intake/Output from previous day: 06/10 0701 - 06/11 0700 In: 1711.3 [P.O.:210; I.V.:1451.3; IV Piggyback:50] Out: 1250 [Urine:800; Emesis/NG output:450] Intake/Output this shift: No intake/output data recorded.  General appearance: alert, cooperative and no distress Resp: clear to auscultation bilaterally GI: Soft, minimal distention.  NG is functional.  We did irrigated.  Some flatus.  Lab Results:  Recent Labs    01/15/19 0522 01/16/19 0155  WBC 6.8 7.4  HGB 11.1* 10.6*  HCT 35.3* 34.3*  PLT 305 328    BMET Recent Labs    01/15/19 0522 01/16/19 0522  NA 134* 135  K 3.9 3.7  CL 101 100  CO2 24 26  GLUCOSE 125* 155*  BUN 37* 35*  CREATININE 0.85 0.71  CALCIUM 8.2* 8.1*   PT/INR No results for input(s): LABPROT, INR in the last 72 hours.  Recent Labs  Lab 01/12/19 0928 01/15/19 0522 01/16/19 0522  AST 21 17 19   ALT 16 13 13   ALKPHOS 61 39 43  BILITOT 0.7 0.2* 0.4  PROT 7.5 5.5* 5.6*  ALBUMIN 4.4 2.6* 2.6*     Lipase     Component Value Date/Time   LIPASE 29 01/12/2019 0928     Prior to Admission medications   Medication Sig Start Date End Date  Taking? Authorizing Provider  atorvastatin (LIPITOR) 20 MG tablet Take 20 mg by mouth daily. 11/14/18   [provider]  latanoprost (XALATAN) 0.005 % ophthalmic solution Place 1 drop into both eyes at bedtime. 11/11/18   [provider]  levothyroxine (SYNTHROID) 50 MCG tablet Take 50 mcg by mouth daily. 11/14/18   [provider]  losartan-hydrochlorothiazide (HYZAAR) 100-25 MG tablet Take 1 tablet by mouth daily.    [provider]  omeprazole (PRILOSEC) 40 MG capsule Take 40 mg by mouth daily.    [provider]    Medications: . heparin  5,000 Units Subcutaneous Q8H  . insulin aspart  0-15 Units Subcutaneous Q4H  . latanoprost  1 drop Both Eyes QHS  . levothyroxine  25 mcg Intravenous Daily  . pantoprazole (PROTONIX) IV  40 mg Intravenous Q24H  . sodium chloride flush  10-40 mL Intracatheter Q12H   . famotidine (PEPCID) IV 20 mg (01/15/19 1423)  . lactated ringers 40 mL/hr at 01/16/19 0620  . TPN ADULT (ION) 35 mL/hr at 01/15/19 1738   Anti-infectives (From admission, onward)   Start     Dose/Rate Route Frequency Ordered Stop   01/14/19 1103  sodium chloride 0.9 % with cefoTEtan (CEFOTAN) ADS Med    Note to Pharmacy: Vevelyn RoyalsHarvell, Gwendolyn  :  cabinet override      01/14/19 1103 01/14/19 1124   01/14/19 1045  cefoTEtan (CEFOTAN) 2 g in sodium chloride 0.9 % 100 mL IVPB     2 g 200 mL/hr over 30 Minutes Intravenous  Once 01/14/19 1044 01/14/19 1124     Assessment/Plan Hypertension Hyperlipidemia Hypothyroid Hard of hearing Severe malnutrition - prealbumin 8.4 =>> TPN   SBO S/p exploratory laparotomy with lysis of adhesions, 01/14/2019, Dr. Ralene Ok  - IR NG placement 6/10  FEN: IV fluids/n.p.o./TPN ID: Preop cefotetan DVT:  Heparin Follow up:  Dr. Rosendo Gros POC: Tyrone Schimke Son   [phone]    Plan: We will repeat a film and look at her small bowel dilatation see if that is improved.  If it looks better we may consider clamping  the NG.  She is seeing physical therapy and is ambulating.  We will progress her as quickly as possible.  She is not sure about discharge arrangements.  I told her she needed to talk to her children about plans after discharge.      LOS: 4 days    Mamie Diiorio 01/16/2019 [phone]

## 2019-01-17 LAB — COMPREHENSIVE METABOLIC PANEL
ALT: 17 U/L (ref 0–44)
AST: 19 U/L (ref 15–41)
Albumin: 2.6 g/dL — ABNORMAL LOW (ref 3.5–5.0)
Alkaline Phosphatase: 41 U/L (ref 38–126)
Anion gap: 9 (ref 5–15)
BUN: 30 mg/dL — ABNORMAL HIGH (ref 8–23)
CO2: 26 mmol/L (ref 22–32)
Calcium: 8.1 mg/dL — ABNORMAL LOW (ref 8.9–10.3)
Chloride: 102 mmol/L (ref 98–111)
Creatinine, Ser: 0.61 mg/dL (ref 0.44–1.00)
GFR calc Af Amer: >60 mL/min (ref >60)
GFR calc non Af Amer: >60 mL/min (ref >60)
Glucose, Bld: 178 mg/dL — ABNORMAL HIGH (ref 70–99)
Potassium: 3.6 mmol/L (ref 3.5–5.1)
Sodium: 137 mmol/L (ref 135–145)
Total Bilirubin: 0.4 mg/dL (ref 0.3–1.2)
Total Protein: 5.5 g/dL — ABNORMAL LOW (ref 6.5–8.1)

## 2019-01-17 LAB — GLUCOSE, CAPILLARY
Glucose-Capillary: 133 mg/dL — ABNORMAL HIGH (ref 70–99)
Glucose-Capillary: 150 mg/dL — ABNORMAL HIGH (ref 70–99)
Glucose-Capillary: 153 mg/dL — ABNORMAL HIGH (ref 70–99)
Glucose-Capillary: 163 mg/dL — ABNORMAL HIGH (ref 70–99)
Glucose-Capillary: 166 mg/dL — ABNORMAL HIGH (ref 70–99)
Glucose-Capillary: 171 mg/dL — ABNORMAL HIGH (ref 70–99)
Glucose-Capillary: 178 mg/dL — ABNORMAL HIGH (ref 70–99)

## 2019-01-17 LAB — PHOSPHORUS: Phosphorus: 2.3 mg/dL — ABNORMAL LOW (ref 2.5–4.6)

## 2019-01-17 LAB — MAGNESIUM: Magnesium: 2.1 mg/dL (ref 1.7–2.4)

## 2019-01-17 MED ORDER — TRAVASOL 10 % IV SOLN
INTRAVENOUS | Status: AC
  Administered 2019-01-17: 17:00:00 via INTRAVENOUS
  Filled 2019-01-17: qty 990

## 2019-01-17 MED ORDER — POTASSIUM PHOSPHATES 15 MMOLE/5ML IV SOLN
15.0000 mmol | Freq: Once | INTRAVENOUS | Status: AC
  Administered 2019-01-17: 15 mmol via INTRAVENOUS
  Filled 2019-01-17: qty 5

## 2019-01-17 NOTE — Progress Notes (Addendum)
3 Days Post-Op    CC: Abdominal pain  Subjective: She has a little flatus or bowel sounds are more active.  She walked x1 yesterday.  She says she sat up in the chair quite a bit.  Her abdomen still little bit distended.  Midline incision honeycomb dressing is intact.  She is moving 750-800 on her incentive spirometry.  Objective: Vital signs in last 24 hours: Temp:  [98.3 F (36.8 C)-99.3 F (37.4 C)] 99.3 F (37.4 C) (06/12 0415) Pulse Rate:  [73-88] 75 (06/12 0415) Resp:  [16-19] 19 (06/12 0415) BP: (117-129)/(67-71) 129/71 (06/12 0415) SpO2:  [93 %-98 %] 95 % (06/12 0415) Last BM Date: 01/12/19 120 PO 2000 IV Urine 1100 300NG Afebrile, VSS CMP OK  Intake/Output from previous day: 06/11 0701 - 06/12 0700 In: 2181.6 [P.O.:120; I.V.:1771.1; IV Piggyback:290.4] Out: 1400 [Urine:1100; Emesis/NG output:300] Intake/Output this shift: Total I/O In: 925.3 [I.V.:725.3; IV Piggyback:200] Out: 525 [Urine:525]  General appearance: alert, cooperative, no distress and Complains her mouth is dry. Resp: clear to auscultation bilaterally and Anterior exam. GI: Soft still little distended.  Bowel sounds are high-pitched little hyperactive still.  Some flatus no BM.  Midline dressing is clean.  Lab Results:  Recent Labs    01/15/19 0522 01/16/19 0155  WBC 6.8 7.4  HGB 11.1* 10.6*  HCT 35.3* 34.3*  PLT 305 328    BMET Recent Labs    01/16/19 0522 01/17/19 0436  NA 135 137  K 3.7 3.6  CL 100 102  CO2 26 26  GLUCOSE 155* 178*  BUN 35* 30*  CREATININE 0.71 0.61  CALCIUM 8.1* 8.1*   PT/INR No results for input(s): LABPROT, INR in the last 72 hours.  Recent Labs  Lab 01/12/19 0928 01/15/19 0522 01/16/19 0522 01/17/19 0436  AST 21 17 19 19   ALT 16 13 13 17   ALKPHOS 61 39 43 41  BILITOT 0.7 0.2* 0.4 0.4  PROT 7.5 5.5* 5.6* 5.5*  ALBUMIN 4.4 2.6* 2.6* 2.6*     Lipase     Component Value Date/Time   LIPASE 29 01/12/2019 0928     Medications: . heparin   5,000 Units Subcutaneous Q8H  . insulin aspart  0-15 Units Subcutaneous Q4H  . latanoprost  1 drop Both Eyes QHS  . levothyroxine  25 mcg Intravenous Daily  . pantoprazole (PROTONIX) IV  40 mg Intravenous Q24H  . sodium chloride flush  10-40 mL Intracatheter Q12H   . lactated ringers 10 mL/hr at 01/16/19 2010  . TPN ADULT (ION) 55 mL/hr at 01/16/19 1815    Assessment/Plan Hypertension Hyperlipidemia Hypothyroid Hard of hearing Severe malnutrition - prealbumin 8.4 =>> TPN   SBO S/p exploratory laparotomy with lysis of adhesions, 01/14/2019, Dr. Ralene Ok  POD #3   - IR NG placement 6/10  FEN: IV fluids/n.p.o./TPN ID: Preop cefotetan DVT:  Heparin Follow up:  Dr. Rosendo Gros POC: Marites, Nath   [phone]   Plan:   We will put her on sips and chips today.  Continue to mobilize and await bowel function return.    Continue TPN for malnutrition.   I talked with the son and he and his sister will plan to be with her when she goes home.     LOS: 5 days   JENNINGS,WILLARD 01/17/2019 [phone]   Agree with above. Passing flatus, but no BM.  Mildly distended.  Has BS.   Has been up to the chair.  Alphonsa Overall, MD, [facility] Surgery Pager: [phone]  Office phone:  [phone]

## 2019-01-17 NOTE — Progress Notes (Signed)
PROGRESS NOTE  Christie Hinton NFA:[id number] DOB: 1928/01/26 DOA: 01/12/2019 PCP: Sharren BridgeSellers, Christie H, NP  Brief History   Patient is 85 year old female with history of hypertension, hyperlipidemia, GERD who [facility] with complaints of generalized abdominal pain that started after dinner, vomiting.  Patient reported lower abdominal pain which progressively got worse and also developed nausea and vomiting after that.  Last bowel movement was 2 days ago before admission.  CT abdomen showed small bowel obstruction with dilated loops of small bowel.  Findings concerning for closed-loop obstruction.  General surgery consulted.    The patient underwent exploratory laparotomy with lysis of adhesions on 01/14/19. NGT tube was clamped and removed on 01/17/2019. She remains NPO and on TPN. Consultants  . General surgery.  Procedures  . Exploratory laparoscopy with LOA.  Antibiotics  . None  Subjective  The patient is resting quietly in bed. She has no new complaints.  Objective   Vitals:  Vitals:   01/17/19 0415 01/17/19 1349  BP: 129/71 135/74  Pulse: 75 78  Resp: 19 18  Temp: 99.3 F (37.4 C) 98.2 F (36.8 C)  SpO2: 95% 95%    Exam:  Constitutional:  . The patient is awake, alert, and oriented x 3. No acute distress. Respiratory:  . No increased work of breathing. . No wheezes, rales, or rhonchi.  . No tactile fremitus. Cardiovascular:  . Regular rate and rhythm. . No murmurs, ectopy, or gallups. . No lateral PMI. No thrills. Abdomen:  . Abdomen is soft. Non-distended. Non-tender. . No hernias, masses, or organomegaly is appreciated. Marland Kitchen. Hypoactive bowel sounds.  Musculoskeletal:  . No cyanosis, clubbing, or edema Skin:  . No rashes, lesions, ulcers . palpation of skin: no induration or nodules Neurologic:  . CN 2-12 intact . Sensation all 4 extremities intact Psychiatric:  . Mental status o Mood, affect appropriate o Orientation to  person, place, time  . judgment and insight appear intact    I have personally reviewed the following:   Today's Data  . Chemistry, vitals  Imaging  . 1 view abdomen: Stable small bowel dilatation. Post-op ileus vs persistent SBO.  Scheduled Meds: . heparin  5,000 Units Subcutaneous Q8H  . insulin aspart  0-15 Units Subcutaneous Q4H  . latanoprost  1 drop Both Eyes QHS  . levothyroxine  25 mcg Intravenous Daily  . pantoprazole (PROTONIX) IV  40 mg Intravenous Q24H  . sodium chloride flush  10-40 mL Intracatheter Q12H   Continuous Infusions: . lactated ringers 10 mL/hr at 01/17/19 1735  . TPN ADULT (ION) 75 mL/hr at 01/17/19 1715    Principal Problem:   SBO (small bowel obstruction) s/p lysis of adhesions 01/14/2019 Active Problems:   Essential hypertension   Mixed hyperlipidemia   LOS: 5 days    A & P   SBO: CT finding as above. General surgery following.  Underwent  exploratory laparotomy with lysis of adhesions .POD 2. Pt is improving and surgery has decided to clamp NGT.  Continue pain management, IV fluids, antiemetics. Incentive spirometry, PT consult, ambulation encouraged. NGT tube has been removed, but she remains NPO and on TPN which is being managed by pharmacy.   Hypokalemia: Resolved. Electrolytes managed with TPN via pharmacy.  Hypophosphatemia: Electrolytes managed with TPN via pharmacy  Hypertension: Currently blood pressure stable.  Continue PRN meds  Hyperlipidemia: On Lipitor at home  Hypothyroidism: Continue IV Synthyroid.  Nutrition Problem: Inadequate oral intake Etiology: inability to eat.  I have seen  and examined this patient myself. I have spent 30 minutes in her evaluation and care.  DVT prophylaxis: heparin [place] Code Status: Full Family Communication: Disussed with son on 01/13/2019 Disposition Plan: Home after surgical clearance  Christie Dehaas, DO [facility] Direct contact: see [email or web address]  7PM-7AM contact night coverage  as above

## 2019-01-17 NOTE — Progress Notes (Signed)
PHARMACY - ADULT TOTAL PARENTERAL NUTRITION CONSULT NOTE   Pharmacy Consult for TPN Indication: Post-operative ileus following surgery for SBO  Patient Measurements: Height: 5' 6.5" (168.9 cm) Weight: 149 lb (67.6 kg) IBW/kg (Calculated) : 60.45 TPN AdjBW (KG): 67.6 Body mass index is 23.69 kg/m.   Recent Labs    01/15/19 0522 01/16/19 0522 01/17/19 0436  NA 134* 135 137  K 3.9 3.7 3.6  CL 101 100 102  CO2 24 26 26   GLUCOSE 125* 155* 178*  BUN 37* 35* 30*  CREATININE 0.85 0.71 0.61  CALCIUM 8.2* 8.1* 8.1*  PHOS 3.3 1.9* 2.3*  MG 2.2 2.2 2.1  ALBUMIN 2.6* 2.6* 2.6*  ALKPHOS 39 43 41  AST 17 19 19   ALT 13 13 17   BILITOT 0.2* 0.4 0.4  TRIG 63 107  --   PREALBUMIN 8.4*  --   --   6/10 Corr Ca = 9.1  Insulin Requirements: 19 units SSI since TPN started - Hx of DM (diet controlled) noted.  Current Nutrition: NPO  IVF: LR 10 mL/hr  Central access: PICC  TPN start date: 6.10  ASSESSMENT                                                                                        HPI: 85 y/o F with SBO underwent laparotomy and lysis of adhesions on 6.9.20, now to begin TPN.  Hx of DM (diet-controlled) noted.  Significant events:   Today:   Glucose (goal CBG<150) -  CBGs 144-172 while TPN was running  Electrolytes - WNL except phos remains low at 2.3 (s/p 10 mmol NaPhos 6/11)  Renal - BUN elevated; SCr WNL  LFTs - all < ULN  TGs - 107 (6/10)  Prealbumin - 8.4 (6/9)  NUTRITIONAL GOALS                                                                           KCal:    [id number] / day Protein: 80-95 g /day  Goal TPN rate is 75 ml/hr (provides 1854 KCal/day, 99 g protein/day, 1.8 L/day)  PLAN                                                                                                        Now:  15mMol KPhos x 1  At 1800 today, provided that central access is in place:  Continue custom TPN and increase rate from 55 ml/hr to 75 ml/hr.  [facility] at  [facility]  Will place standard electrolyte concentrations in TPN except: Na at 75  TPN to contain standard multivitamins daily and trace elements MWF.  Continue SSI Novolog moderate scale q4h  Will add 10 units insulin regular to TPN  TPN lab panels on Mondays & Thursdays.  F/u daily.  Thank you for allowing pharmacy to be a part of this patient's care.  Leron Croak, PharmD PGY1 Pharmacy Resident 01/17/2019 9:58 AM

## 2019-01-18 LAB — GLUCOSE, CAPILLARY
Glucose-Capillary: 168 mg/dL — ABNORMAL HIGH (ref 70–99)
Glucose-Capillary: 141 mg/dL — ABNORMAL HIGH (ref 70–99)
Glucose-Capillary: 157 mg/dL — ABNORMAL HIGH (ref 70–99)
Glucose-Capillary: 164 mg/dL — ABNORMAL HIGH (ref 70–99)
Glucose-Capillary: 125 mg/dL — ABNORMAL HIGH (ref 70–99)

## 2019-01-18 LAB — BASIC METABOLIC PANEL
Anion gap: 5 (ref 5–15)
BUN: 26 mg/dL — ABNORMAL HIGH (ref 8–23)
CO2: 27 mmol/L (ref 22–32)
Calcium: 8.1 mg/dL — ABNORMAL LOW (ref 8.9–10.3)
Chloride: 106 mmol/L (ref 98–111)
Creatinine, Ser: 0.6 mg/dL (ref 0.44–1.00)
GFR calc Af Amer: >60 mL/min (ref >60)
GFR calc non Af Amer: >60 mL/min (ref >60)
Glucose, Bld: 173 mg/dL — ABNORMAL HIGH (ref 70–99)
Potassium: 4 mmol/L (ref 3.5–5.1)
Sodium: 138 mmol/L (ref 135–145)

## 2019-01-18 LAB — MAGNESIUM: Magnesium: 2 mg/dL (ref 1.7–2.4)

## 2019-01-18 LAB — PHOSPHORUS: Phosphorus: 2.8 mg/dL (ref 2.5–4.6)

## 2019-01-18 MED ORDER — TRAVASOL 10 % IV SOLN
INTRAVENOUS | Status: AC
  Administered 2019-01-18: 17:00:00 via INTRAVENOUS
  Filled 2019-01-18: qty 990

## 2019-01-18 NOTE — Progress Notes (Signed)
PROGRESS NOTE  Christie Hinton ZOX:[id number] DOB: 15-Nov-1927 DOA: 01/12/2019 PCP: Sharren BridgeSellers, Billie H, NP  Brief History   Patient is 85 year old female with history of hypertension, hyperlipidemia, GERD who [facility] with complaints of generalized abdominal pain that started after dinner, vomiting.  Patient reported lower abdominal pain which progressively got worse and also developed nausea and vomiting after that.  Last bowel movement was 2 days ago before admission.  CT abdomen showed small bowel obstruction with dilated loops of small bowel.  Findings concerning for closed-loop obstruction.  General surgery consulted.    The patient underwent exploratory laparotomy with lysis of adhesions on 01/14/19. NGT tube was clamped and removed on 01/17/2019. Today surgery has advanced the patient to "sips and chips". She continues to be supported on TPN. Consultants  . General surgery.  Procedures  . Exploratory laparoscopy with LOA.  Antibiotics  . None  Subjective  The patient is resting quietly in bed. She has no new complaints. No BM yet.  Objective   Vitals:  Vitals:   01/18/19 0608 01/18/19 1328  BP: 129/72 136/69  Pulse: 88 80  Resp:  20  Temp: 98.5 F (36.9 C) 98.4 F (36.9 C)  SpO2: 95% 97%    Exam:  Constitutional:  . The patient is awake, alert, and oriented x 3. No acute distress. Respiratory:  . No increased work of breathing. . No wheezes, rales, or rhonchi.  . No tactile fremitus. Cardiovascular:  . Regular rate and rhythm. . No murmurs, ectopy, or gallups. . No lateral PMI. No thrills. Abdomen:  . Abdomen is soft. Non-distended. Non-tender. . No hernias, masses, or organomegaly is appreciated. Marland Kitchen. Hypoactive bowel sounds.  Musculoskeletal:  . No cyanosis, clubbing, or edema Skin:  . No rashes, lesions, ulcers . palpation of skin: no induration or nodules Neurologic:  . CN 2-12 intact . Sensation all 4 extremities intact  Psychiatric:  . Mental status o Mood, affect appropriate o Orientation to person, place, time  . judgment and insight appear intact    I have personally reviewed the following:   Today's Data  . Chemistry, vitals  Imaging  . 1 view abdomen: Stable small bowel dilatation. Post-op ileus vs persistent SBO.  Scheduled Meds: . heparin  5,000 Units Subcutaneous Q8H  . insulin aspart  0-15 Units Subcutaneous Q4H  . latanoprost  1 drop Both Eyes QHS  . levothyroxine  25 mcg Intravenous Daily  . pantoprazole (PROTONIX) IV  40 mg Intravenous Q24H  . sodium chloride flush  10-40 mL Intracatheter Q12H   Continuous Infusions: . lactated ringers 10 mL/hr at 01/17/19 1735  . TPN ADULT (ION) 75 mL/hr at 01/18/19 1723    Principal Problem:   SBO (small bowel obstruction) s/p lysis of adhesions 01/14/2019 Active Problems:   Essential hypertension   Mixed hyperlipidemia   LOS: 6 days    A & P   SBO: CT finding as above. General surgery following.  Underwent  exploratory laparotomy with lysis of adhesions .POD 2. Pt is improving and surgery has decided to clamp NGT.  Continue pain management, IV fluids, antiemetics. Incentive spirometry, PT consult, ambulation encouraged. NGT tube has been removed. She has been advanced to "sips and chips" by surgery. She is still getting TPN which is being managed by pharmacy.   Hypokalemia: Resolved. Electrolytes managed with TPN via pharmacy.  Hypophosphatemia: Electrolytes managed with TPN via pharmacy  Hypertension: Currently blood pressure stable.  Continue PRN meds  Hyperlipidemia: On  Lipitor at home  Hypothyroidism: Continue IV Synthyroid.  Nutrition Problem: Inadequate oral intake Etiology: inability to eat.  I have seen and examined this patient myself. I have spent 32 minutes in her evaluation and care.  DVT prophylaxis: heparin [place] Code Status: Full Family Communication: Disussed with son on 01/13/2019 Disposition Plan: Home  after surgical clearance  Khadija Thier, DO [facility] Direct contact: see [email or web address]  7PM-7AM contact night coverage as above

## 2019-01-18 NOTE — Progress Notes (Signed)
4 Days Post-Op   Subjective/Chief Complaint: No BM or flatus    Objective: Vital signs in last 24 hours: Temp:  [98.2 F (36.8 C)-98.5 F (36.9 C)] 98.5 F (36.9 C) (06/13 8295) Pulse Rate:  [78-88] 88 (06/13 0608) Resp:  [16-18] 16 (06/12 2000) BP: (129-138)/(72-74) 129/72 (06/13 0608) SpO2:  [95 %-99 %] 95 % (06/13 0608) Last BM Date: 01/17/19  Intake/Output from previous day: 06/12 0701 - 06/13 0700 In: 1954.1 [I.V.:1695.7; IV Piggyback:258.4] Out: 1250 [Urine:1250] Intake/Output this shift: Total I/O In: 0  Out: 200 [Urine:200]  General appearance: alert and cooperative Incision/Wound:CDI soft mild distention not tender   Lab Results:  Recent Labs    01/16/19 0155  WBC 7.4  HGB 10.6*  HCT 34.3*  PLT 328   BMET Recent Labs    01/17/19 0436 01/18/19 0354  NA 137 138  K 3.6 4.0  CL 102 106  CO2 26 27  GLUCOSE 178* 173*  BUN 30* 26*  CREATININE 0.61 0.60  CALCIUM 8.1* 8.1*   PT/INR No results for input(s): LABPROT, INR in the last 72 hours. ABG No results for input(s): PHART, HCO3 in the last 72 hours.  Invalid input(s): PCO2, PO2  Studies/Results: No results found.  Anti-infectives: Anti-infectives (From admission, onward)   Start     Dose/Rate Route Frequency Ordered Stop   01/14/19 1103  sodium chloride 0.9 % with cefoTEtan (CEFOTAN) ADS Med    Note to Pharmacy: [name]  : cabinet override      01/14/19 1103 01/14/19 1124   01/14/19 1045  cefoTEtan (CEFOTAN) 2 g in sodium chloride 0.9 % 100 mL IVPB     2 g 200 mL/hr over 30 Minutes Intravenous  Once 01/14/19 1044 01/14/19 1124      Assessment/Plan: s/p Procedure(s): EXPLORATORY LAPAROTOMY LYSIS ADHESIONS (N/A) Hypertension Hyperlipidemia Hypothyroid Hard of hearing Severe malnutrition-prealbumin 8.4=>> TPN   SBO S/p exploratory laparotomy with lysis of adhesions,01/14/2019, Dr. Ralene Ok  POD #3             -IR NG placement 6/10  FEN:IV  fluids/n.p.o./TPN ID: Preop cefotetan DVT: Heparin Follow up: Dr. Rosendo Gros POC: [place], [place]   [phone]   Plan:             We will put her on sips and chips today.  Continue to mobilize and await bowel function return.               Continue TPN for malnutrition.              I talked with the son and he and his sister will plan to be with her when she goes home.      LOS: 6 days    Christie Hinton 01/18/2019

## 2019-01-18 NOTE — Progress Notes (Signed)
PHARMACY - ADULT TOTAL PARENTERAL NUTRITION CONSULT NOTE   Pharmacy Consult for TPN Indication: Post-operative ileus following surgery for SBO  Patient Measurements: Height: 5' 6.5" (168.9 cm) Weight: 149 lb (67.6 kg) IBW/kg (Calculated) : 60.45 TPN AdjBW (KG): 67.6 Body mass index is 23.69 kg/m.   Recent Labs    01/16/19 0522 01/17/19 0436 01/18/19 0354  NA 135 137 138  K 3.7 3.6 4.0  CL 100 102 106  CO2 26 26 27   GLUCOSE 155* 178* 173*  BUN 35* 30* 26*  CREATININE 0.71 0.61 0.60  CALCIUM 8.1* 8.1* 8.1*  PHOS 1.9* 2.3* 2.8  MG 2.2 2.1 2.0  ALBUMIN 2.6* 2.6*  --   ALKPHOS 43 41  --   AST 19 19  --   ALT 13 17  --   BILITOT 0.4 0.4  --   TRIG 107  --   --     Insulin Requirements: 18 units SSI last 24hr with 10 units insulin added to TPN bag as well 6/12 - Hx of DM (diet controlled) noted.  Current Nutrition: NPO  IVF: LR 10 mL/hr  Central access: PICC  TPN start date: 6/10  ASSESSMENT                                                                                        HPI: 85 y/o F with SBO underwent laparotomy and lysis of adhesions on 6.9.20, now to begin TPN.  Hx of DM (diet-controlled) noted.  Significant events:   Today:   Glucose (goal CBG<150) -  10 units insulin added to TPN 6/12 - CBGs since new TPN started 166, 153, 168, 141  Electrolytes - all WNL  Renal - stable  LFTs - all < ULN  TGs - 107 (6/10)  Prealbumin - 8.4 (6/9)  NUTRITIONAL GOALS                                                                           KCal:    1610-96041750-2025 / day Protein: 80-95 g /day  Goal TPN rate is 75 ml/hr (provides 1854 KCal/day, 99 g protein/day, 1.8 L/day)  PLAN                                                                                                        At 1800 today:  Continue custom TPN at goal rate of 75 ml/hr.  [facility]  Will place standard electrolyte  concentrations in TPN except: Na at 75  TPN to contain standard  multivitamins daily and trace elements MWF.  Continue SSI Novolog moderate scale q4h for now since increasing insulin in TPN  Increase insulin in TPN from 10 to 15 units  TPN lab panels on Mondays & Thursdays.  F/u daily.  Thank you for allowing pharmacy to be a part of this patient's care.  Adrian Saran, PharmD, BCPS 01/18/2019 9:16 AM

## 2019-01-19 ENCOUNTER — Inpatient Hospital Stay (HOSPITAL_COMMUNITY): Payer: Medicare Other

## 2019-01-19 LAB — GLUCOSE, CAPILLARY
Glucose-Capillary: 120 mg/dL — ABNORMAL HIGH (ref 70–99)
Glucose-Capillary: 128 mg/dL — ABNORMAL HIGH (ref 70–99)
Glucose-Capillary: 154 mg/dL — ABNORMAL HIGH (ref 70–99)
Glucose-Capillary: 167 mg/dL — ABNORMAL HIGH (ref 70–99)
Glucose-Capillary: 168 mg/dL — ABNORMAL HIGH (ref 70–99)

## 2019-01-19 LAB — BASIC METABOLIC PANEL
Anion gap: 3 — ABNORMAL LOW (ref 5–15)
BUN: 30 mg/dL — ABNORMAL HIGH (ref 8–23)
CO2: 27 mmol/L (ref 22–32)
Calcium: 8.4 mg/dL — ABNORMAL LOW (ref 8.9–10.3)
Chloride: 108 mmol/L (ref 98–111)
Creatinine, Ser: 0.55 mg/dL (ref 0.44–1.00)
GFR calc Af Amer: >60 mL/min (ref >60)
GFR calc non Af Amer: >60 mL/min (ref >60)
Glucose, Bld: 147 mg/dL — ABNORMAL HIGH (ref 70–99)
Potassium: 3.9 mmol/L (ref 3.5–5.1)
Sodium: 138 mmol/L (ref 135–145)

## 2019-01-19 LAB — MAGNESIUM: Magnesium: 2 mg/dL (ref 1.7–2.4)

## 2019-01-19 LAB — PHOSPHORUS: Phosphorus: 2.9 mg/dL (ref 2.5–4.6)

## 2019-01-19 MED ORDER — TRAVASOL 10 % IV SOLN
INTRAVENOUS | Status: AC
  Filled 2019-01-19: qty 990

## 2019-01-19 MED ORDER — PROCHLORPERAZINE EDISYLATE 10 MG/2ML IJ SOLN
10.0000 mg | INTRAMUSCULAR | Status: DC | PRN
  Administered 2019-01-23: 10 mg via INTRAVENOUS
  Filled 2019-01-19: qty 2

## 2019-01-19 MED ORDER — INSULIN ASPART 100 UNIT/ML ~~LOC~~ SOLN
0.0000 [IU] | Freq: Four times a day (QID) | SUBCUTANEOUS | Status: DC
  Administered 2019-01-19 (×2): 3 [IU] via SUBCUTANEOUS
  Administered 2019-01-20: 2 [IU] via SUBCUTANEOUS
  Administered 2019-01-20: 3 [IU] via SUBCUTANEOUS
  Administered 2019-01-20 (×2): 2 [IU] via SUBCUTANEOUS
  Administered 2019-01-21: 3 [IU] via SUBCUTANEOUS
  Administered 2019-01-21 – 2019-01-22 (×5): 2 [IU] via SUBCUTANEOUS
  Administered 2019-01-22: 3 [IU] via SUBCUTANEOUS
  Administered 2019-01-23 (×2): 2 [IU] via SUBCUTANEOUS

## 2019-01-19 MED ORDER — ALUM & MAG HYDROXIDE-SIMETH 200-200-20 MG/5ML PO SUSP
30.0000 mL | ORAL | Status: DC | PRN
  Administered 2019-01-19 – 2019-02-09 (×6): 30 mL via ORAL
  Filled 2019-01-19 (×6): qty 30

## 2019-01-19 NOTE — Plan of Care (Signed)
Patient sitting up in bed this morning; pain controlled. No nausea currently. Will continue to monitor.

## 2019-01-19 NOTE — Progress Notes (Signed)
PROGRESS NOTE  Christie Hinton GEX:[id number] DOB: 1927/10/20 DOA: 01/12/2019 PCP: Belia Heman, NP  Brief History   Patient is 85 year old female with history of hypertension, hyperlipidemia, GERD who presented initially to [place] with complaints of generalized abdominal pain that started after dinner, vomiting.  Patient reported lower abdominal pain which progressively got worse and also developed nausea and vomiting after that.  Last bowel movement was 2 days ago before admission.  CT abdomen showed small bowel obstruction with dilated loops of small bowel.  Findings concerning for closed-loop obstruction.  General surgery consulted.    The patient underwent exploratory laparotomy with lysis of adhesions on 01/14/19. NGT tube was clamped and removed on 01/17/2019. Today surgery has advanced the patient to clears. She continues to be supported on TPN, but this will be weaned off as she is able to tolerate a diet. Consultants  . General surgery.  Procedures  . Exploratory laparoscopy with LOA.  Antibiotics  . None  Subjective  The patient is resting quietly in bed. She has no new complaints. She is passing flatus. No BM yet.  Objective   Vitals:  Vitals:   01/19/19 0432 01/19/19 1459  BP: (!) 142/79 117/64  Pulse: 84 (!) 105  Resp: 18 18  Temp: 98.6 F (37 C) (!) 97.5 F (36.4 C)  SpO2: 97% 97%    Exam:  Constitutional:  . The patient is awake, alert, and oriented x 3. No acute distress. Respiratory:  . No increased work of breathing. . No wheezes, rales, or rhonchi.  . No tactile fremitus. Cardiovascular:  . Regular rate and rhythm. . No murmurs, ectopy, or gallups. . No lateral PMI. No thrills. Abdomen:  . Abdomen is soft. Non-distended. Non-tender. . No hernias, masses, or organomegaly is appreciated. Marland Kitchen Hypoactive bowel sounds.  Musculoskeletal:  . No cyanosis, clubbing, or edema Skin:  . No rashes, lesions, ulcers . palpation of skin: no  induration or nodules Neurologic:  . CN 2-12 intact . Sensation all 4 extremities intact Psychiatric:  . Mental status o Mood, affect appropriate o Orientation to person, place, time  . judgment and insight appear intact    I have personally reviewed the following:   Today's Data  . Chemistry, vitals  Imaging  . 1 view abdomen: Stable small bowel dilatation. Post-op ileus vs persistent SBO.  Scheduled Meds: . heparin  5,000 Units Subcutaneous Q8H  . insulin aspart  0-15 Units Subcutaneous Q6H  . latanoprost  1 drop Both Eyes QHS  . levothyroxine  25 mcg Intravenous Daily  . pantoprazole (PROTONIX) IV  40 mg Intravenous Q24H  . sodium chloride flush  10-40 mL Intracatheter Q12H   Continuous Infusions: . lactated ringers 10 mL/hr at 01/19/19 0200  . TPN ADULT (ION) 75 mL/hr at 01/19/19 0200  . TPN ADULT (ION)      Principal Problem:   SBO (small bowel obstruction) s/p lysis of adhesions 01/14/2019 Active Problems:   Essential hypertension   Mixed hyperlipidemia   LOS: 7 days    A & P   SBO: CT finding as above. General surgery following.  Underwent  exploratory laparotomy with lysis of adhesions .POD 2. Pt is improving and surgery has decided to clamp NGT.  Continue pain management, IV fluids, antiemetics. Incentive spirometry, PT consult, ambulation encouraged. NGT tube has been removed. She has been advanced to a clear liquid diet by surgery. She is still getting TPN which is being managed by pharmacy. This will be tapered  off as she is able to tolerate her diet.  This morning the patient was complaining of left sided abdominal pain and nausea. Abdominal film was unchanged from previous. Surgery was notified. The patient did eventually pass a liquid stool with resolution of abdominal pain and nause.  Hypokalemia: Resolved. Electrolytes managed with TPN via pharmacy.  Hypophosphatemia: Electrolytes managed with TPN via pharmacy  Hypertension: Currently blood  pressure stable.  Continue PRN meds  Hyperlipidemia: On Lipitor at home  Hypothyroidism: Continue IV Synthyroid.  Nutrition Problem: Inadequate oral intake Etiology: inability to eat.  I have seen and examined this patient myself. I have spent 30 minutes in her evaluation and care.  DVT prophylaxis: heparin [place] Code Status: Full Family Communication: Disussed with son on 01/13/2019 Disposition Plan: Home after surgical clearance  Gatlyn Lipari, DO [facility] Direct contact: see [email or web address]  7PM-7AM contact night coverage as above

## 2019-01-19 NOTE — Progress Notes (Signed)
Patient returned to floor from xray; had one episode loose stool in bed. MD notified of this and xray results resulting.

## 2019-01-19 NOTE — Progress Notes (Signed)
PHARMACY - ADULT TOTAL PARENTERAL NUTRITION CONSULT NOTE   Pharmacy Consult for TPN Indication: Post-operative ileus following surgery for SBO  Patient Measurements: Height: 5' 6.5" (168.9 cm) Weight: 149 lb (67.6 kg) IBW/kg (Calculated) : 60.45 TPN AdjBW (KG): 67.6 Body mass index is 23.69 kg/m.   Recent Labs    01/17/19 0436 01/18/19 0354 01/19/19 0530  NA 137 138 138  K 3.6 4.0 3.9  CL 102 106 108  CO2 26 27 27   GLUCOSE 178* 173* 147*  BUN 30* 26* 30*  CREATININE 0.61 0.60 0.55  CALCIUM 8.1* 8.1* 8.4*  PHOS 2.3* 2.8 2.9  MG 2.1 2.0 2.0  ALBUMIN 2.6*  --   --   ALKPHOS 41  --   --   AST 19  --   --   ALT 17  --   --   BILITOT 0.4  --   --     Insulin Requirements: 15 units SSI last 24hr with 15 units insulin added to TPN bag - Hx of DM (diet controlled) noted.  Current Nutrition: NPO - start CL diet 6/14  IVF: LR 10 mL/hr  Central access: PICC  TPN start date: 6/10  ASSESSMENT                                                                                        HPI: 85 y/o F with SBO underwent laparotomy and lysis of adhesions on 6.9.20, now to begin TPN.  Hx of DM (diet-controlled) noted.  Significant events:  6/14: continue TPN, start CL and per CCS note can start to wean TPN if tolerated  Today:   Glucose (goal CBG<150) -  Insulin in TPN increased from 10 to 15 units on 6/13 - CBGs since new TPN started last PM:  At goal except 1 level slightly above - 125, 120, 154, 128  Electrolytes - all WNL  Renal - stable  LFTs - all < ULN  TGs - 107 (6/10)  Prealbumin - 8.4 (6/9)  NUTRITIONAL GOALS                                                                           KCal:    2992-4268 / day Protein: 80-95 g /day  Goal TPN rate is 75 ml/hr (provides 1854 KCal/day, 99 g protein/day, 1.8 L/day)  PLAN  At 1800 today:  Continue custom TPN at goal  rate of 75 ml/hr.  [facility]  Will place standard electrolyte concentrations in TPN except: Na at 75  TPN to contain standard multivitamins daily and trace elements MWF.  Continue SSI Novolog moderate scale but will change to q6h due to stabilization of CBGs now  Continue insulin in TPN at 15 units  TPN lab panels on Mondays & Thursdays.  F/u daily.  Thank you for allowing pharmacy to be a part of this patient's care.  Hessie KnowsJustin M Coral Timme, PharmD, BCPS 01/19/2019 9:54 AM

## 2019-01-19 NOTE — Progress Notes (Signed)
Patient up to bathroom. Complain of nausea; increased belching. Has had flatus but no BM yet. Notified hospitalist; abd xray ordered and request to notify surgery. Surgery paged with information. Awaiting xray results. Will continue to monitor.

## 2019-01-19 NOTE — Progress Notes (Addendum)
5 Days Post-Op    CC: Abdominal pain  Subjective: Passed some flatus, but no BM. Feels okay.  She has been up walking.  Objective: Vital signs in last 24 hours: Temp:  [98 F (36.7 C)-99 F (37.2 C)] 98.6 F (37 C) (06/14 0432) Pulse Rate:  [79-84] 84 (06/14 0432) Resp:  [18-20] 18 (06/14 0432) BP: (115-142)/(69-80) 142/79 (06/14 0432) SpO2:  [97 %-98 %] 97 % (06/14 0432) Last BM Date: 01/12/19 120 PO 2000 IV Urine 1100 300NG Afebrile, VSS CMP OK  Intake/Output from previous day: 06/13 0701 - 06/14 0700 In: 1942.2 [P.O.:125; I.V.:1817.2] Out: 1250 [Urine:1250] Intake/Output this shift: No intake/output data recorded.  General appearance: alert, cooperative, no distress and Complains her mouth is dry. Resp: clear to auscultation bilaterally  GI: Mildly distended, but has bowel sounds.  Wound looks good.  Dressing removed.  Lab Results:  No results for input(s): WBC, HGB, HCT, PLT in the last 72 hours.  BMET Recent Labs    01/18/19 0354 01/19/19 0530  NA 138 138  K 4.0 3.9  CL 106 108  CO2 27 27  GLUCOSE 173* 147*  BUN 26* 30*  CREATININE 0.60 0.55  CALCIUM 8.1* 8.4*   PT/INR No results for input(s): LABPROT, INR in the last 72 hours.  Recent Labs  Lab 01/12/19 0928 01/15/19 0522 01/16/19 0522 01/17/19 0436  AST 21 17 19 19   ALT 16 13 13 17   ALKPHOS 61 39 43 41  BILITOT 0.7 0.2* 0.4 0.4  PROT 7.5 5.5* 5.6* 5.5*  ALBUMIN 4.4 2.6* 2.6* 2.6*     Lipase     Component Value Date/Time   LIPASE 29 01/12/2019 0928     Medications: . heparin  5,000 Units Subcutaneous Q8H  . insulin aspart  0-15 Units Subcutaneous Q4H  . latanoprost  1 drop Both Eyes QHS  . levothyroxine  25 mcg Intravenous Daily  . pantoprazole (PROTONIX) IV  40 mg Intravenous Q24H  . sodium chloride flush  10-40 mL Intracatheter Q12H   . lactated ringers 10 mL/hr at 01/19/19 0200  . TPN ADULT (ION) 75 mL/hr at 01/19/19 0200     Assessment/Plan Hypertension Hyperlipidemia Hypothyroid Hard of hearing Severe malnutrition - prealbumin 8.4 =>> TPN  SBO S/p exploratory laparotomy with lysis of adhesions, 01/14/2019, Dr. Ralene Ok    FEN: IV fluids/n.p.o./TPN ID: Preop cefotetan DVT:  Heparin Follow up:  Dr. Rosendo Gros POC: Camrin, Lapre   [phone]   Plan:  Continue TPN for malnutrition.   TPN labs tomorrow.  Son and daughter will plan to be with her when she goes home.    To start clear liquids.  If tolerated, can taper TPN.  Otherwise, she looks good.  [Addendum @ 15:00:  KUB ordered by medicine shows dilated small bowel with less progress that would have been hoped.  She is having some loose stools.]   LOS: 7 days   Alphonsa Overall, MD, [facility] Pager: [phone] Office phone:  [phone]

## 2019-01-20 DIAGNOSIS — E1169 Type 2 diabetes mellitus with other specified complication: Secondary | ICD-10-CM

## 2019-01-20 DIAGNOSIS — E785 Hyperlipidemia, unspecified: Secondary | ICD-10-CM

## 2019-01-20 LAB — DIFFERENTIAL
Abs Immature Granulocytes: 0.27 10*3/uL — ABNORMAL HIGH (ref 0.00–0.07)
Basophils Absolute: 0 10*3/uL (ref 0.0–0.1)
Basophils Relative: 1 %
Eosinophils Absolute: 0.1 10*3/uL (ref 0.0–0.5)
Eosinophils Relative: 2 %
Immature Granulocytes: 4 %
Lymphocytes Relative: 17 %
Lymphs Abs: 1 10*3/uL (ref 0.7–4.0)
Monocytes Absolute: 0.8 10*3/uL (ref 0.1–1.0)
Monocytes Relative: 13 %
Neutro Abs: 4 10*3/uL (ref 1.7–7.7)
Neutrophils Relative %: 63 %

## 2019-01-20 LAB — CBC
HCT: 31.9 % — ABNORMAL LOW (ref 36.0–46.0)
Hemoglobin: 9.8 g/dL — ABNORMAL LOW (ref 12.0–15.0)
MCH: 25.9 pg — ABNORMAL LOW (ref 26.0–34.0)
MCHC: 30.7 g/dL (ref 30.0–36.0)
MCV: 84.4 fL (ref 80.0–100.0)
Platelets: 292 10*3/uL (ref 150–400)
RBC: 3.78 MIL/uL — ABNORMAL LOW (ref 3.87–5.11)
RDW: 16 % — ABNORMAL HIGH (ref 11.5–15.5)
WBC: 6.2 10*3/uL (ref 4.0–10.5)
nRBC: 0 % (ref 0.0–0.2)

## 2019-01-20 LAB — COMPREHENSIVE METABOLIC PANEL
ALT: 28 U/L (ref 0–44)
AST: 27 U/L (ref 15–41)
Albumin: 2.6 g/dL — ABNORMAL LOW (ref 3.5–5.0)
Alkaline Phosphatase: 47 U/L (ref 38–126)
Anion gap: 7 (ref 5–15)
BUN: 38 mg/dL — ABNORMAL HIGH (ref 8–23)
CO2: 25 mmol/L (ref 22–32)
Calcium: 8.2 mg/dL — ABNORMAL LOW (ref 8.9–10.3)
Chloride: 106 mmol/L (ref 98–111)
Creatinine, Ser: 0.64 mg/dL (ref 0.44–1.00)
GFR calc Af Amer: >60 mL/min (ref >60)
GFR calc non Af Amer: >60 mL/min (ref >60)
Glucose, Bld: 127 mg/dL — ABNORMAL HIGH (ref 70–99)
Potassium: 3.9 mmol/L (ref 3.5–5.1)
Sodium: 138 mmol/L (ref 135–145)
Total Bilirubin: 0.3 mg/dL (ref 0.3–1.2)
Total Protein: 5.4 g/dL — ABNORMAL LOW (ref 6.5–8.1)

## 2019-01-20 LAB — GLUCOSE, CAPILLARY
Glucose-Capillary: 126 mg/dL — ABNORMAL HIGH (ref 70–99)
Glucose-Capillary: 128 mg/dL — ABNORMAL HIGH (ref 70–99)
Glucose-Capillary: 132 mg/dL — ABNORMAL HIGH (ref 70–99)
Glucose-Capillary: 140 mg/dL — ABNORMAL HIGH (ref 70–99)
Glucose-Capillary: 153 mg/dL — ABNORMAL HIGH (ref 70–99)

## 2019-01-20 LAB — MAGNESIUM: Magnesium: 1.9 mg/dL (ref 1.7–2.4)

## 2019-01-20 LAB — PHOSPHORUS: Phosphorus: 3.4 mg/dL (ref 2.5–4.6)

## 2019-01-20 LAB — PREALBUMIN: Prealbumin: 14.1 mg/dL — ABNORMAL LOW (ref 18–38)

## 2019-01-20 LAB — TRIGLYCERIDES: Triglycerides: 109 mg/dL (ref <150)

## 2019-01-20 MED ORDER — LORAZEPAM 0.5 MG PO TABS
0.5000 mg | ORAL_TABLET | Freq: Once | ORAL | Status: AC
  Administered 2019-01-20: 0.5 mg via ORAL
  Filled 2019-01-20: qty 1

## 2019-01-20 MED ORDER — TRAVASOL 10 % IV SOLN
INTRAVENOUS | Status: AC
  Administered 2019-01-20: 18:00:00 via INTRAVENOUS
  Filled 2019-01-20: qty 990

## 2019-01-20 NOTE — Progress Notes (Signed)
Physical Therapy Treatment Patient Details Name: Christie Hinton MRN: [id number] DOB: 27-Feb-1928 Today's Date: 01/20/2019    History of Present Illness 85 yo female admitted with SBO. S/P lysis of adhesions 6/9/. Hx of DM    PT Comments    Pt continues to participate well.    Follow Up Recommendations  Home health PT;Supervision - Intermittent     Equipment Recommendations  Rolling walker with 5" wheels    Recommendations for Other Services       Precautions / Restrictions Precautions Precautions: Fall Restrictions Weight Bearing Restrictions: No    Mobility  Bed Mobility Overal bed mobility: Needs Assistance Bed Mobility: Sit to Supine       Sit to supine: Supervision   General bed mobility comments: for safety, lines  Transfers Overall transfer level: Needs assistance Equipment used: 4-wheeled walker Transfers: Sit to/from Stand Sit to Stand: Min assist         General transfer comment: Assist to rise, stabilize, control descent. VCs safety, hand placement.  Ambulation/Gait Ambulation/Gait assistance: Min guard;Min assist Gait Distance (Feet): 500 Feet Assistive device: 4-wheeled walker Gait Pattern/deviations: Step-through pattern     General Gait Details: Intermittent assist to stabilize. Pt tolerated distance well. She is mildly unsteady.    Stairs             Wheelchair Mobility    Modified Rankin (Stroke Patients Only)       Balance Overall balance assessment: Mild deficits observed, not formally tested                                          Cognition Arousal/Alertness: Awake/alert Behavior During Therapy: WFL for tasks assessed/performed Overall Cognitive Status: Within Functional Limits for tasks assessed                                        Exercises      General Comments        Pertinent Vitals/Pain Pain Assessment: Faces Faces Pain Scale: Hurts a little bit Pain Descriptors /  Indicators: Sore Pain Intervention(s): Monitored during session    Home Living                      Prior Function            PT Goals (current goals can now be found in the care plan section) Progress towards PT goals: Progressing toward goals    Frequency    Min 3X/week      PT Plan Current plan remains appropriate    Co-evaluation              AM-PAC PT "6 Clicks" Mobility   Outcome Measure  Help needed turning from your back to your side while in a flat bed without using bedrails?: A Little Help needed moving from lying on your back to sitting on the side of a flat bed without using bedrails?: A Little Help needed moving to and from a bed to a chair (including a wheelchair)?: A Little Help needed standing up from a chair using your arms (e.g., wheelchair or bedside chair)?: A Little Help needed to walk in hospital room?: A Little Help needed climbing 3-5 steps with a railing? : A Little 6 Click Score: 18    End  of Session   Activity Tolerance: Patient tolerated treatment well Patient left: in bed;with bed alarm set;with call bell/phone within reach   PT Visit Diagnosis: Unsteadiness on feet (R26.81);Muscle weakness (generalized) (M62.81)     Time: [id number] PT Time Calculation (min) (ACUTE ONLY): 13 min  Charges:  $Gait Training: 8-22 mins                        Rebeca AlertJannie Istvan Behar, PT Acute Rehabilitation Services Pager: [phone] Office: [phone]

## 2019-01-20 NOTE — Care Management Important Message (Signed)
Important Message  Patient Details IM Letter given to Velva Harman RN to present to the Patient Name: Christie Hinton MRN: [id number] Date of Birth: 10/25/1927   Medicare Important Message Given:  Yes    Kerin Salen 01/20/2019, 11:47 AM

## 2019-01-20 NOTE — Progress Notes (Signed)
PHARMACY - ADULT TOTAL PARENTERAL NUTRITION CONSULT NOTE   Pharmacy Consult for TPN Indication: Post-operative ileus following surgery for SBO  Patient Measurements: Height: 5' 6.5" (168.9 cm) Weight: 149 lb (67.6 kg) IBW/kg (Calculated) : 60.45 TPN AdjBW (KG): 67.6 Body mass index is 23.69 kg/m.   Recent Labs    01/19/19 0530 01/20/19 0417  NA 138 138  K 3.9 3.9  CL 108 106  CO2 27 25  GLUCOSE 147* 127*  BUN 30* 38*  CREATININE 0.55 0.64  CALCIUM 8.4* 8.2*  PHOS 2.9 3.4  MG 2.0 1.9  ALBUMIN  --  2.6*  ALKPHOS  --  47  AST  --  27  ALT  --  28  BILITOT  --  0.3  TRIG  --  109  PREALBUMIN  --  14.1*    Insulin Requirements: 13 units SSI last 24hr with 15 units insulin added to TPN bag - Hx of DM (diet controlled) noted.  Current Nutrition: NPO - patient tolerated sips of CL on 6/14  IVF: LR 10 mL/hr  Central access: PICC  TPN start date: 6/10  ASSESSMENT                                                                                        HPI: 85 y/o F with SBO underwent laparotomy and lysis of adhesions on 6.9.20, now to begin TPN.  Hx of DM (diet-controlled) noted.  Significant events:  6/14: continue TPN, start CL and per CCS note can start to wean TPN if tolerated  Today:   Glucose (goal CBG<150) -  Insulin in TPN 15 units - CBGs since new TPN started last PM: 126 - 168 (patient required a total of 13 units insulin aspart over the last 24 hours)  Electrolytes - WNL except Mg slightly below goal at 1.9  Renal - stable  LFTs - all < ULN  TGs - 109 (6/15)  Prealbumin - increased from 8.4 (6/10) to 14.1 (6/15)  NUTRITIONAL GOALS                                                                           KCal:    [id number] / day Protein: 80-95 g /day  Goal TPN rate is 75 ml/hr (provides 1854 KCal/day, 99 g protein/day, 1.8 L/day)  PLAN  At 1800  today:  Continue custom TPN at goal rate of 75 ml/hr.  [facility] at [facility]  Will place standard electrolyte concentrations in TPN except: Na at 75 mEq/L, Mg at 6 mEq/L   TPN to contain standard multivitamins daily and trace elements MWF.  Continue SSI Novolog moderate scale q6h  Increase insulin in TPN from 15 units to 20 units  TPN lab panels on Mondays & Thursdays.  F/u daily.  Thank you for allowing pharmacy to be a part of this patient's care.  Leron Croak, PharmD PGY1 Pharmacy Resident 01/20/2019 10:22 AM

## 2019-01-20 NOTE — Progress Notes (Signed)
PROGRESS NOTE    Anastasio ChampionMarian Bresee  WUJ:[id number] DOB: Dec 01, 1927 DOA: 01/12/2019 PCP: Sharren BridgeSellers, Billie H, NP    Brief Narrative:  85 year old female who presented abdominal pain, nausea or vomiting.  She does have significant past medical history for hypertension, dyslipidemia and GERD.  She reported generalized abdominal pain after a meal, associated with vomiting.  She presented to the hospital due to progressive abdominal pain.  On her initial physical examination her temperature was 97.7, heart rate 76, blood pressure 166/82, oxygen saturation 99%.  Her lungs are clear to auscultation bilaterally, heart S1-S2 present rhythmic, the abdomen was soft, generalized abdominal pain to palpation, hypoactive bowel sounds, no lower extremity edema.  CT of the abdomen with a small bowel obstruction, dilated loops of small bowel in the left abdomen with mesenteric edema and a well-defined transition point.  Findings concerning for closed-loop obstruction, possibly related to an internal hernia.  Patient was admitted to the hospital working diagnosis of small bowel obstruction.  Assessment & Plan:   Principal Problem:   SBO (small bowel obstruction) s/p lysis of adhesions 01/14/2019 Active Problems:   Essential hypertension   Mixed hyperlipidemia  1. Small bowel obstruction. Patient continue to have abdominal pain, but improved from yesterday, no nausea or vomiting, and improved appetite. Abdominal films from 06/14 with persistent dilatation. Patient with positive flatus. Abdomen is soft. Diet has been advanced. No leukocytosis. Out of bed to the chair tid with meals and physical therapy evaluation.   2. Hypokalemia, hypophosphatemia,. K has been corrected to 3,9, with P at 3,4, will continue close monitoring of renal function and electriolytes, diet has been advanced.   3. HTN. Continue blood pressure monitoring.   4. Hypothyroid. Continue levothyroxine.   5. Dyslipidemia. Continue statin therapy.    6. T2DM. Continue insulin sliding scale for glucose cover and monitoring, fasting glucose this am 127.    DVT prophylaxis: enoxaparin   Code Status: full Family Communication: no family at the bedside  Disposition Plan/ discharge barriers: pending clinical improvement  Body mass index is 23.69 kg/m. Malnutrition Type:  Nutrition Problem: Inadequate oral intake Etiology: inability to eat   Malnutrition Characteristics:  Signs/Symptoms: NPO status   Nutrition Interventions:  Interventions: TPN  RN Pressure Injury Documentation:     Consultants:   Surgery   Procedures:   Exploratory laparotomy lysis adhesions.   Antimicrobials:       Subjective: Patient is feeling better but not yet back to baseline, continue to have abdominal distention and abdominal pain, no nausea or vomiting, no chest pain or dyspnea.   Objective: Vitals:   01/19/19 2156 01/20/19 0046 01/20/19 0526 01/20/19 1328  BP: 101/64 116/73 113/65 113/64  Pulse: 82 82 84 76  Resp: 18 20 18 16   Temp: 99 F (37.2 C) 98.1 F (36.7 C) 98.2 F (36.8 C) 98 F (36.7 C)  TempSrc: Oral Oral Oral Oral  SpO2: 96% 96% 98% 100%  Weight:      Height:        Intake/Output Summary (Last 24 hours) at 01/20/2019 1500 Last data filed at 01/20/2019 1009 Gross per 24 hour  Intake 1634.79 ml  Output -  Net 1634.79 ml   Filed Weights   01/12/19 0929  Weight: 67.6 kg    Examination:   General: deconditioned and ill looking appearing  Neurology: Awake and alert, non focal  E ENT: mild pallor, no icterus, oral mucosa moist Cardiovascular: No JVD. S1-S2 present, rhythmic, no gallops, rubs, or murmurs. Trace lower extremity  edema. Pulmonary: positive breath sounds bilaterally, adequate air movement, no wheezing, rhonchi or rales. Gastrointestinal. Abdomen distended, tender to palpation, no organomegaly, no rebound or guarding. Surgical wound clean.  Skin. No rashes Musculoskeletal: no joint deformities      Data Reviewed: I have personally reviewed following labs and imaging studies  CBC: Recent Labs  Lab 01/14/19 0342 01/15/19 0522 01/16/19 0155 01/20/19 0417  WBC 13.6* 6.8 7.4 6.2  NEUTROABS  --  5.4  --  4.0  HGB 12.8 11.1* 10.6* 9.8*  HCT 40.4 35.3* 34.3* 31.9*  MCV 82.1 83.3 84.5 84.4  PLT 379 305 328 413   Basic Metabolic Panel: Recent Labs  Lab 01/16/19 0522 01/17/19 0436 01/18/19 0354 01/19/19 0530 01/20/19 0417  NA 135 137 138 138 138  K 3.7 3.6 4.0 3.9 3.9  CL 100 102 106 108 106  CO2 26 26 27 27 25   GLUCOSE 155* 178* 173* 147* 127*  BUN 35* 30* 26* 30* 38*  CREATININE 0.71 0.61 0.60 0.55 0.64  CALCIUM 8.1* 8.1* 8.1* 8.4* 8.2*  MG 2.2 2.1 2.0 2.0 1.9  PHOS 1.9* 2.3* 2.8 2.9 3.4   GFR: Estimated Creatinine Clearance: 45.5 mL/min (by C-G formula based on SCr of 0.64 mg/dL). Liver Function Tests: Recent Labs  Lab 01/15/19 0522 01/16/19 0522 01/17/19 0436 01/20/19 0417  AST 17 19 19 27   ALT 13 13 17 28   ALKPHOS 39 43 41 47  BILITOT 0.2* 0.4 0.4 0.3  PROT 5.5* 5.6* 5.5* 5.4*  ALBUMIN 2.6* 2.6* 2.6* 2.6*   No results for input(s): LIPASE, AMYLASE in the last 168 hours. No results for input(s): AMMONIA in the last 168 hours. Coagulation Profile: No results for input(s): INR, PROTIME in the last 168 hours. Cardiac Enzymes: No results for input(s): CKTOTAL, CKMB, CKMBINDEX, TROPONINI in the last 168 hours. BNP (last 3 results) No results for input(s): PROBNP in the last 8760 hours. HbA1C: No results for input(s): HGBA1C in the last 72 hours. CBG: Recent Labs  Lab 01/19/19 1135 01/19/19 1835 01/20/19 0032 01/20/19 0531 01/20/19 1332  GLUCAP 167* 168* 126* 153* 140*   Lipid Profile: Recent Labs    01/20/19 0417  TRIG 109   Thyroid Function Tests: No results for input(s): TSH, T4TOTAL, FREET4, T3FREE, THYROIDAB in the last 72 hours. Anemia Panel: No results for input(s): VITAMINB12, FOLATE, FERRITIN, TIBC, IRON, RETICCTPCT in the last  72 hours.    Radiology Studies: I have reviewed all of the imaging during this hospital visit personally     Scheduled Meds: . heparin  5,000 Units Subcutaneous Q8H  . insulin aspart  0-15 Units Subcutaneous Q6H  . latanoprost  1 drop Both Eyes QHS  . levothyroxine  25 mcg Intravenous Daily  . pantoprazole (PROTONIX) IV  40 mg Intravenous Q24H  . sodium chloride flush  10-40 mL Intracatheter Q12H   Continuous Infusions: . lactated ringers 10 mL/hr at 01/20/19 0600  . TPN ADULT (ION)    . TPN ADULT (ION)       LOS: 8 days         Gerome Apley, MD

## 2019-01-20 NOTE — Progress Notes (Addendum)
6 Days Post-Op  Subjective: CC:  Patient reports she is feeling better from yesterday into today. Reports that she had 3 loose BM's yesterday. Is tolerating sips of clears from the floor without any N/V. She is passing flatus. Walked in the halls with walker x2 yesterday. Reports some upper abdominal burning at times that she feels is related to her hiatal hernia. No abdominal pain currently. Does feel like she has some phlegm in her throat. Using her IS  Objective: Vital signs in last 24 hours: Temp:  [97.5 F (36.4 C)-99 F (37.2 C)] 98.2 F (36.8 C) (06/15 0526) Pulse Rate:  [82-105] 84 (06/15 0526) Resp:  [18-20] 18 (06/15 0526) BP: (101-117)/(64-73) 113/65 (06/15 0526) SpO2:  [96 %-98 %] 98 % (06/15 0526) Last BM Date: 01/19/19  Intake/Output from previous day: 06/14 0701 - 06/15 0700 In: 1835.8 [I.V.:1835.8] Out: 250 [Urine:250] Intake/Output this shift: No intake/output data recorded.  PE: Gen: Awake and alert, NAD Heart: regular, murmur Lungs: Normal rate and effort, pulling ~750-900 on IS Abd: Soft, mild distension, tenderness appropriately around her midline incision. Midline incision with staples intact. No wound separation. Some mild erythema surrounding staples that favors irritation/reactive inflammation rather than infection. Will monitor. Normoactive BS Msk: no edema  Lab Results:  Recent Labs    01/20/19 0417  WBC 6.2  HGB 9.8*  HCT 31.9*  PLT 292   BMET Recent Labs    01/19/19 0530 01/20/19 0417  NA 138 138  K 3.9 3.9  CL 108 106  CO2 27 25  GLUCOSE 147* 127*  BUN 30* 38*  CREATININE 0.55 0.64  CALCIUM 8.4* 8.2*   PT/INR No results for input(s): LABPROT, INR in the last 72 hours. CMP     Component Value Date/Time   NA 138 01/20/2019 0417   K 3.9 01/20/2019 0417   CL 106 01/20/2019 0417   CO2 25 01/20/2019 0417   GLUCOSE 127 (H) 01/20/2019 0417   BUN 38 (H) 01/20/2019 0417   CREATININE 0.64 01/20/2019 0417   CALCIUM 8.2 (L)  01/20/2019 0417   PROT 5.4 (L) 01/20/2019 0417   ALBUMIN 2.6 (L) 01/20/2019 0417   AST 27 01/20/2019 0417   ALT 28 01/20/2019 0417   ALKPHOS 47 01/20/2019 0417   BILITOT 0.3 01/20/2019 0417   GFRNONAA >60 01/20/2019 0417   GFRAA >60 01/20/2019 0417   Lipase     Component Value Date/Time   LIPASE 29 01/12/2019 0928       Studies/Results: Dg Abd 2 Views  Result Date: 01/19/2019 CLINICAL DATA:  Recent exploratory laparotomy with lysis of adhesions for small-bowel obstruction. Abdominal distension. EXAM: ABDOMEN - 2 VIEW COMPARISON:  01/16/2019 FINDINGS: The enteric tube has been removed. Postsurgical changes are again noted with skin staples remaining in place. Moderate to severe diffuse small bowel dilatation is similar to the prior study. Gas is present in nondilated colon. Residual oral contrast material has migrated distally and is now located in the descending and sigmoid colon. No intraperitoneal free air is identified. Prior left hip arthroplasty is noted. IMPRESSION: Unchanged small bowel dilatation concerning for obstruction. Electronically Signed   By: Sebastian AcheAllen  Grady M.D.   On: 01/19/2019 14:25    Anti-infectives: Anti-infectives (From admission, onward)   Start     Dose/Rate Route Frequency Ordered Stop   01/14/19 1103  sodium chloride 0.9 % with cefoTEtan (CEFOTAN) ADS Med    Note to Pharmacy: Vevelyn RoyalsHarvell, Gwendolyn  : cabinet override  01/14/19 1103 01/14/19 1124   01/14/19 1045  cefoTEtan (CEFOTAN) 2 g in sodium chloride 0.9 % 100 mL IVPB     2 g 200 mL/hr over 30 Minutes Intravenous  Once 01/14/19 1044 01/14/19 1124       Assessment/Plan Thyroid disease HTN HLD DM  - above per medicine team Protein Calorie Malnutrition - Pre-albumin 8.4 (6/10)>>14.1(6/15)  SBO S/p Ex Lap with LOA for SBO due to Omental Adhesion, internal hernia - Dr. Rosendo Gros - 01/14/2019 - POD #6 - Advance to CLD - Continue TPN - Mobilize for bowel function - Pulm toliet - Keep K>4 and Mg  >2 for bowel function  FEN - TPN, CLD, K 3.9, Mg 1.9 VTE - Heparin, SCDs, Mobilize  ID - Cefotetan periop. None currently. Afebrile. WBC 6,200 Foley - None  Follow up - Dr. Rosendo Gros  POC - Son - Tyrone Schimke [id number])  - Per note from Dr. Lucia Gaskins on 6/14, patient's son and daughter plan to be with her when she returns home   Plan: Dg abd yesterday with small bowel dilatation. Suspect this lagging behind how patient is progressing clinically. She is tolerating sips of clears from the floor without n/v. She notes 3 episodes of loose stool yesterday. She continues to pass flatus. She has good bowel sounds and reports less distension today (although is still mildly distended). Will start on CLD and see how she does. Continue TPN. Pre-albumin up from 8.4 to 14.1.   LOS: 8 days    Jillyn Ledger , [facility] 01/20/2019, 8:17 AM Pager: [phone]

## 2019-01-21 LAB — CBC WITH DIFFERENTIAL/PLATELET
Abs Immature Granulocytes: 0.34 10*3/uL — ABNORMAL HIGH (ref 0.00–0.07)
Basophils Absolute: 0 10*3/uL (ref 0.0–0.1)
Basophils Relative: 0 %
Eosinophils Absolute: 0.2 10*3/uL (ref 0.0–0.5)
Eosinophils Relative: 2 %
HCT: 30.5 % — ABNORMAL LOW (ref 36.0–46.0)
Hemoglobin: 9.1 g/dL — ABNORMAL LOW (ref 12.0–15.0)
Immature Granulocytes: 5 %
Lymphocytes Relative: 16 %
Lymphs Abs: 1.1 10*3/uL (ref 0.7–4.0)
MCH: 25.3 pg — ABNORMAL LOW (ref 26.0–34.0)
MCHC: 29.8 g/dL — ABNORMAL LOW (ref 30.0–36.0)
MCV: 85 fL (ref 80.0–100.0)
Monocytes Absolute: 0.8 10*3/uL (ref 0.1–1.0)
Monocytes Relative: 11 %
Neutro Abs: 4.6 10*3/uL (ref 1.7–7.7)
Neutrophils Relative %: 66 %
Platelets: 307 10*3/uL (ref 150–400)
RBC: 3.59 MIL/uL — ABNORMAL LOW (ref 3.87–5.11)
RDW: 16.1 % — ABNORMAL HIGH (ref 11.5–15.5)
WBC: 7 10*3/uL (ref 4.0–10.5)
nRBC: 0 % (ref 0.0–0.2)

## 2019-01-21 LAB — GLUCOSE, CAPILLARY
Glucose-Capillary: 135 mg/dL — ABNORMAL HIGH (ref 70–99)
Glucose-Capillary: 142 mg/dL — ABNORMAL HIGH (ref 70–99)
Glucose-Capillary: 146 mg/dL — ABNORMAL HIGH (ref 70–99)
Glucose-Capillary: 165 mg/dL — ABNORMAL HIGH (ref 70–99)

## 2019-01-21 LAB — BASIC METABOLIC PANEL
Anion gap: 7 (ref 5–15)
BUN: 34 mg/dL — ABNORMAL HIGH (ref 8–23)
CO2: 24 mmol/L (ref 22–32)
Calcium: 8.1 mg/dL — ABNORMAL LOW (ref 8.9–10.3)
Chloride: 107 mmol/L (ref 98–111)
Creatinine, Ser: 0.56 mg/dL (ref 0.44–1.00)
GFR calc Af Amer: >60 mL/min (ref >60)
GFR calc non Af Amer: >60 mL/min (ref >60)
Glucose, Bld: 120 mg/dL — ABNORMAL HIGH (ref 70–99)
Potassium: 3.9 mmol/L (ref 3.5–5.1)
Sodium: 138 mmol/L (ref 135–145)

## 2019-01-21 MED ORDER — LORATADINE 10 MG PO TABS
10.0000 mg | ORAL_TABLET | Freq: Every day | ORAL | Status: DC
  Administered 2019-01-21 – 2019-02-11 (×14): 10 mg via ORAL
  Filled 2019-01-21 (×16): qty 1

## 2019-01-21 MED ORDER — SUCRALFATE 1 G PO TABS
1.0000 g | ORAL_TABLET | Freq: Three times a day (TID) | ORAL | Status: DC
  Administered 2019-01-21 (×3): 1 g via ORAL
  Filled 2019-01-21 (×3): qty 1

## 2019-01-21 MED ORDER — POTASSIUM CHLORIDE 10 MEQ/100ML IV SOLN
10.0000 meq | INTRAVENOUS | Status: AC
  Administered 2019-01-21 (×2): 10 meq via INTRAVENOUS
  Filled 2019-01-21: qty 100

## 2019-01-21 MED ORDER — FENTANYL CITRATE (PF) 100 MCG/2ML IJ SOLN: 25.0000 ug | INTRAMUSCULAR | Status: DC | PRN

## 2019-01-21 MED ORDER — OXYCODONE HCL 5 MG PO TABS
5.0000 mg | ORAL_TABLET | ORAL | Status: DC | PRN
  Administered 2019-01-21 – 2019-01-23 (×3): 5 mg via ORAL
  Filled 2019-01-21 (×3): qty 1

## 2019-01-21 MED ORDER — TRAVASOL 10 % IV SOLN
INTRAVENOUS | Status: DC
  Administered 2019-01-21: 18:00:00 via INTRAVENOUS
  Filled 2019-01-21: qty 990

## 2019-01-21 MED ORDER — PANTOPRAZOLE SODIUM 40 MG PO TBEC
40.0000 mg | DELAYED_RELEASE_TABLET | Freq: Two times a day (BID) | ORAL | Status: DC
  Administered 2019-01-21 – 2019-01-23 (×4): 40 mg via ORAL
  Filled 2019-01-21 (×4): qty 1

## 2019-01-21 NOTE — Progress Notes (Signed)
PHARMACY - ADULT TOTAL PARENTERAL NUTRITION CONSULT NOTE   Pharmacy Consult for TPN Indication: Post-operative ileus following surgery for SBO  Patient Measurements: Height: 5' 6.5" (168.9 cm) Weight: 149 lb (67.6 kg) IBW/kg (Calculated) : 60.45 TPN AdjBW (KG): 67.6 Body mass index is 23.69 kg/m.   Vitals:   01/20/19 2030 01/21/19 0624  BP: 106/60 122/60  Pulse: 75 74  Resp: 17 20  Temp: 98 F (36.7 C) 98 F (36.7 C)  SpO2: 97% 98%    Intake/Output Summary (Last 24 hours) at 01/21/2019 0948 Last data filed at 01/21/2019 8101 Gross per 24 hour  Intake 1558.31 ml  Output 400 ml  Net 1158.31 ml    Recent Labs    01/19/19 0530 01/20/19 0417 01/21/19 0316  NA 138 138 138  K 3.9 3.9 3.9  CL 108 106 107  CO2 27 25 24   GLUCOSE 147* 127* 120*  BUN 30* 38* 34*  CREATININE 0.55 0.64 0.56  CALCIUM 8.4* 8.2* 8.1*  PHOS 2.9 3.4  --   MG 2.0 1.9  --   ALBUMIN  --  2.6*  --   ALKPHOS  --  47  --   AST  --  27  --   ALT  --  28  --   BILITOT  --  0.3  --   TRIG  --  109  --   PREALBUMIN  --  14.1*  --   Corr Ca 6/16 = 9.2  Insulin Requirements: 11 units SSI last 24hr with 20 units insulin added to TPN bag - Hx of DM (diet controlled) noted.  Current Nutrition: advanced to CL today  IVF: LR 10 mL/hr  Central access: PICC  TPN start date: 6/10  ASSESSMENT                                                                                        HPI: 85 y/o F with SBO underwent laparotomy and lysis of adhesions on 6.9.20, now on TPN.  Hx of DM (diet-controlled) noted.  Significant events:  6/14: continue TPN, start CL soon and per CCS note may start to wean TPN soon if tolerated 6/16: started CL diet  Today:   Glucose (goal CBG<150) -  Insulin in TPN 15 units - CBGs since new TPN started last PM: 128 - 142 (patient required a total of 11 units insulin aspart over the last 24 hours)  Electrolytes - K (today) and Mg (yesterday) slightly below MD goal of K > 4 and Mg >  2 for bowel function   Renal - stable  LFTs - all < ULN  TGs - 109 (6/15)  Prealbumin - increased from 8.4 (6/10) to 14.1 (6/15)  NUTRITIONAL GOALS  KCal:    1750-2025 / day :    4098-1191Protein: 80-95 g /day  Goal TPN rate is 75 ml/hr (provides 1854 KCal/day, 99 g protein/day, 1.8 L/day)  PLAN                                                                                                        KCl 10mEq IV q1h x 2 At 1800 today:  Continue custom TPN at goal rate of 75 ml/hr.  Increase Mg to 10 mEq/L  Continue 20 units insulin in TPN  TPN to contain standard multivitamins daily and trace elements MWF. Continue SSI Novolog moderate scale q6h Continue mIVF at 10 mL/hr BMet, Mg tomorrow TPN lab panels on Mondays & Thursdays. F/u daily.  Thank you for allowing pharmacy to be a part of this patient's care.  Elie Goodyandy Bearett Porcaro, PharmD, BCPS [phone] 01/21/2019  10:00 AM

## 2019-01-21 NOTE — Progress Notes (Signed)
7 Days Post-Op  Subjective: CC:  Patient reports she is feeling better from yesterday.  Having BM's and tolerating sips of clears from the floor without any N/V. Walked in the halls with walker x2 yesterday.  Denies pain.  Ambulating with PT  Objective: Vital signs in last 24 hours: Temp:  [98 F (36.7 C)] 98 F (36.7 C) (06/16 0624) Pulse Rate:  [74-76] 74 (06/16 0624) Resp:  [16-20] 20 (06/16 0624) BP: (106-122)/(60-64) 122/60 (06/16 0624) SpO2:  [97 %-100 %] 98 % (06/16 0624) Last BM Date: 01/20/19  Intake/Output from previous day: 06/15 0701 - 06/16 0700 In: 1558.3 [P.O.:120; I.V.:1438.3] Out: 400 [Urine:400] Intake/Output this shift: No intake/output data recorded.  PE: Gen: Awake and alert, NAD Heart: regular, murmur Lungs: Normal rate and effort, pulling ~750-900 on IS Abd: Soft, mild distension, tenderness appropriately around her midline incision. Midline incision with staples intact. No wound separation. Some mild erythema surrounding staples that favors irritation/reactive inflammation rather than infection.  Msk: no edema  Lab Results:  Recent Labs    01/20/19 0417 01/21/19 0316  WBC 6.2 7.0  HGB 9.8* 9.1*  HCT 31.9* 30.5*  PLT 292 307   BMET Recent Labs    01/20/19 0417 01/21/19 0316  NA 138 138  K 3.9 3.9  CL 106 107  CO2 25 24  GLUCOSE 127* 120*  BUN 38* 34*  CREATININE 0.64 0.56  CALCIUM 8.2* 8.1*   PT/INR No results for input(s): LABPROT, INR in the last 72 hours. CMP     Component Value Date/Time   NA 138 01/21/2019 0316   K 3.9 01/21/2019 0316   CL 107 01/21/2019 0316   CO2 24 01/21/2019 0316   GLUCOSE 120 (H) 01/21/2019 0316   BUN 34 (H) 01/21/2019 0316   CREATININE 0.56 01/21/2019 0316   CALCIUM 8.1 (L) 01/21/2019 0316   PROT 5.4 (L) 01/20/2019 0417   ALBUMIN 2.6 (L) 01/20/2019 0417   AST 27 01/20/2019 0417   ALT 28 01/20/2019 0417   ALKPHOS 47 01/20/2019 0417   BILITOT 0.3 01/20/2019 0417   GFRNONAA >60 01/21/2019  0316   GFRAA >60 01/21/2019 0316   Lipase     Component Value Date/Time   LIPASE 29 01/12/2019 0928       Studies/Results: Dg Abd 2 Views  Result Date: 01/19/2019 CLINICAL DATA:  Recent exploratory laparotomy with lysis of adhesions for small-bowel obstruction. Abdominal distension. EXAM: ABDOMEN - 2 VIEW COMPARISON:  01/16/2019 FINDINGS: The enteric tube has been removed. Postsurgical changes are again noted with skin staples remaining in place. Moderate to severe diffuse small bowel dilatation is similar to the prior study. Gas is present in nondilated colon. Residual oral contrast material has migrated distally and is now located in the descending and sigmoid colon. No intraperitoneal free air is identified. Prior left hip arthroplasty is noted. IMPRESSION: Unchanged small bowel dilatation concerning for obstruction. Electronically Signed   By: Sebastian AcheAllen  Grady M.D.   On: 01/19/2019 14:25    Anti-infectives: Anti-infectives (From admission, onward)   Start     Dose/Rate Route Frequency Ordered Stop   01/14/19 1103  sodium chloride 0.9 % with cefoTEtan (CEFOTAN) ADS Med    Note to Pharmacy: Vevelyn RoyalsHarvell, Gwendolyn  : cabinet override      01/14/19 1103 01/14/19 1124   01/14/19 1045  cefoTEtan (CEFOTAN) 2 g in sodium chloride 0.9 % 100 mL IVPB     2 g 200 mL/hr over 30 Minutes Intravenous  Once 01/14/19  1044 01/14/19 1124       Assessment/Plan Thyroid disease HTN HLD DM  - above per medicine team Protein Calorie Malnutrition - Pre-albumin 8.4 (6/10)>>14.1(6/15)  SBO S/p Ex Lap with LOA for SBO due to Omental Adhesion, internal hernia - Dr. Rosendo Gros - 01/14/2019 - POD #6 - Advance to CLD - Continue TPN - Mobilize for bowel function - Pulm toliet - Keep K>4 and Mg >2 for bowel function  FEN - TPN, advance to reg diet today VTE - Heparin, SCDs, Mobilize  ID - Cefotetan periop. None currently. Afebrile. WBC 6,200 Foley - None  Follow up - Dr. Rosendo Gros  POC - Son - Tyrone Schimke  [phone])  - Per note from Dr. Lucia Gaskins on 6/14, patient's son and daughter plan to be with her when she returns home   Plan: She is tolerating sips of clears from the floor without n/v. She notes 3 episodes of loose stool yesterday. She continues to pass flatus.  Will advance diet and see how she does. Continue TPN until we see how she is eating. Pre-albumin up from 8.4 to 14.1.   LOS: 9 days     Rosario Adie, MD  [facility]

## 2019-01-21 NOTE — TOC Initial Note (Signed)
Transition of Care [facility]) - Initial/Assessment Note    Patient Details  Name: Christie Hinton MRN: [id number] Date of Birth: 01-Jul-1928  Transition of [facility] CM/SW Contact:    Nila Nephew, LCSW Phone Number: [phone] 01/21/2019, 11:32 AM  Clinical Narrative:  Pt is 7 days post op for SBO. Receiving TPN and reports plan to try CLD today. Discussed HHPT recommendation with pt- she states she has had HHPT last year with [facility] after going to [facility] when she had a hip replacement. States she had positive experience and would like to engage in HHPT again. Notified [facility] representative who advised that due to pt's residence the [facility] therapy team will be able to provide services to pt. Asked to be notified when pt DCs. Pt lives at home alone and has close family support. Uses RW and cane.                 Expected Discharge Plan: [place] Barriers to Discharge: Continued Medical Work up   Patient Goals and CMS Choice Patient states their goals for this hospitalization and ongoing recovery are:: "get to go home" CMS Medicare.gov Compare Post Acute Care list provided to:: Patient Choice offered to / list presented to : Patient  Expected Discharge Plan and Services Expected Discharge Plan: [place] In-house Referral: Clinical Social Work   Post Acute Care Choice: [place] arrangements for the past 2 months: [place]: PT [place]: [facility] [employer] Date [place]: 01/21/19 Time [place]: 3 Representative spoke with at [place]: Tim/Mike  Prior Living Arrangements/Services Living arrangements for the past 2 months: [place] with:: Self Patient language and need for interpreter reviewed:: No Do you feel safe going back to the place where you live?: Yes      Need for Family Participation in  Patient Care: No (Comment) Care giver support system in place?: Yes (comment)(pt lives alone but family is going to come help her at DC) Current home services: DME Criminal Activity/Legal Involvement Pertinent to Current Situation/Hospitalization: No - Comment as needed  Activities of Daily Living Home Assistive Devices/Equipment: Cane (specify quad or straight) ADL Screening (condition at time of admission) Patient's cognitive ability adequate to safely complete daily activities?: Yes Is the patient deaf or have difficulty hearing?: Yes Does the patient have difficulty seeing, even when wearing glasses/contacts?: No Does the patient have difficulty concentrating, remembering, or making decisions?: No Patient able to express need for assistance with ADLs?: Yes Does the patient have difficulty dressing or bathing?: No Independently performs ADLs?: Yes (appropriate for developmental age) Does the patient have difficulty walking or climbing stairs?: No Weakness of Legs: Both Weakness of Arms/Hands: Both  Permission Sought/Granted Permission sought to share information with : Chartered certified accountant granted to share information with : Yes, Verbal Permission Granted     Permission granted to share info w AGENCY: [facility]- to find out what [facility] agency pt was referred to by [facility] last year        Emotional Assessment Appearance:: Appears stated age Attitude/Demeanor/Rapport: Engaged Affect (typically observed): Calm, Adaptable, Appropriate, Pleasant Orientation: : Oriented to Self, Oriented to Place, Oriented to  Time, Oriented to Situation Alcohol / Substance Use: Not Applicable Psych  Involvement: No (comment)  Admission diagnosis:  Abdominal Pain Patient Active Problem List   Diagnosis Date Noted  . SBO (small bowel obstruction) s/p lysis of adhesions 01/14/2019 01/12/2019  . Heart murmur 06/20/2018  . Arthritis of left hip 11/23/2017  . Presbyopia of both  eyes 03/22/2017  . Pseudophakia of right eye 03/22/2017  . Ocular hypertension, bilateral 11/29/2016  . Primary open-angle glaucoma, bilateral, mild stage 11/20/2016  . Neuropathic pain 07/20/2016  . Essential hypertension 09/22/2015  . Mixed hyperlipidemia 09/22/2015  . Acquired hypothyroidism 09/22/2015  . Chronic GERD 09/22/2015  . Diabetes mellitus type 2 in nonobese (HCC) 09/22/2015   PCP:  Sharren BridgeSellers, Billie H, NP Pharmacy:   [facility] [phone] - [place], [place] - [address] AT [facility] [address] [place] [phone] Phone: [phone] Fax: [phone]     Social Determinants of Health (SDOH) Interventions    Readmission Risk Interventions No flowsheet data found.

## 2019-01-21 NOTE — Progress Notes (Signed)
PROGRESS NOTE    Christie Hinton  NWG:[id number] DOB: 1928/05/21 DOA: 01/12/2019 PCP: Sharren BridgeSellers, Billie H, NP    Brief Narrative:  85 year old female who presented abdominal pain, nausea or vomiting.  She does have significant past medical history for hypertension, dyslipidemia and GERD.  She reported generalized abdominal pain after a meal, associated with vomiting.  She presented to the hospital due to progressive abdominal pain.  On her initial physical examination her temperature was 97.7, heart rate 76, blood pressure 166/82, oxygen saturation 99%.  Her lungs are clear to auscultation bilaterally, heart S1-S2 present rhythmic, the abdomen was soft, generalized abdominal pain to palpation, hypoactive bowel sounds, no lower extremity edema.  CT of the abdomen with a small bowel obstruction, dilated loops of small bowel in the left abdomen with mesenteric edema and a well-defined transition point.  Findings concerning for closed-loop obstruction, possibly related to an internal hernia.  Patient was admitted to the hospital working diagnosis of small bowel obstruction.   Assessment & Plan:   Principal Problem:   SBO (small bowel obstruction) s/p lysis of adhesions 01/14/2019 Active Problems:   Essential hypertension   Mixed hyperlipidemia  1. Small bowel obstruction. Diet has been advanced, patient with dyspepsia symptoms, will continue proton pump inhibitor bid po and will add sucralfate, will continue as needed IV fentanyl and antiemetics. Mild abdominal distention. Continue to follow with surgery recommendations. Out of bed as tolerated. Will add oral oxycodone as needed for pain.   2. Hypokalemia, hypophosphatemia,. K stable at 3,9, with serum bicarbonate at 24, will follow on renal panel in am. Continue IV tpn for now. Renal function has remained preserved.  3. HTN. Blood pressure monitoring.   4. Hypothyroid. On levothyroxine, will change to oral formulation.   5. Dyslipidemia. On  statin therapy.   6. T2DM. Continue glucose cover and monitoring with insulin sliding.    DVT prophylaxis: enoxaparin   Code Status: full Family Communication: no family at the bedside  Disposition Plan/ discharge barriers: pending clinical improvement   Body mass index is 25.21 kg/m. Malnutrition Type:  Nutrition Problem: Inadequate oral intake Etiology: inability to eat   Malnutrition Characteristics:  Signs/Symptoms: NPO status   Nutrition Interventions:  Interventions: TPN  RN Pressure Injury Documentation:     Consultants:   Surgery   Procedures:     Antimicrobials:       Subjective: Patient had right upper abdominal pain after breakfast, burning in nature, severe in intensity, with no radiation, no nausea or vomiting, improved with IV analgesics. Her diet has been advanced.    Objective: Vitals:   01/20/19 2030 01/21/19 0624 01/21/19 1215 01/21/19 1349  BP: 106/60 122/60  119/69  Pulse: 75 74  81  Resp: 17 20  18   Temp: 98 F (36.7 C) 98 F (36.7 C)  98.2 F (36.8 C)  TempSrc: Oral Oral  Oral  SpO2: 97% 98%  98%  Weight:   71.9 kg   Height:        Intake/Output Summary (Last 24 hours) at 01/21/2019 1425 Last data filed at 01/21/2019 1400 Gross per 24 hour  Intake 2194.92 ml  Output 600 ml  Net 1594.92 ml   Filed Weights   01/12/19 0929 01/21/19 1215  Weight: 67.6 kg 71.9 kg    Examination:   General: deconditioned.  Neurology: Awake and alert, non focal  E ENT: mild pallor, no icterus, oral mucosa moist Cardiovascular: No JVD. S1-S2 present, rhythmic, no gallops, rubs, or murmurs. No lower extremity  edema. Pulmonary: decreased breath sounds bilaterally, adequate air movement, no wheezing, rhonchi or rales. Gastrointestinal. Abdomen mild distended, no organomegaly, no rebound or guarding, tender to deep palpation at the right upper quadrant. Skin. No rashes Musculoskeletal: no joint deformities     Data Reviewed: I have  personally reviewed following labs and imaging studies  CBC: Recent Labs  Lab 01/15/19 0522 01/16/19 0155 01/20/19 0417 01/21/19 0316  WBC 6.8 7.4 6.2 7.0  NEUTROABS 5.4  --  4.0 4.6  HGB 11.1* 10.6* 9.8* 9.1*  HCT 35.3* 34.3* 31.9* 30.5*  MCV 83.3 84.5 84.4 85.0  PLT 305 328 292 272   Basic Metabolic Panel: Recent Labs  Lab 01/16/19 0522 01/17/19 0436 01/18/19 0354 01/19/19 0530 01/20/19 0417 01/21/19 0316  NA 135 137 138 138 138 138  K 3.7 3.6 4.0 3.9 3.9 3.9  CL 100 102 106 108 106 107  CO2 26 26 27 27 25 24   GLUCOSE 155* 178* 173* 147* 127* 120*  BUN 35* 30* 26* 30* 38* 34*  CREATININE 0.71 0.61 0.60 0.55 0.64 0.56  CALCIUM 8.1* 8.1* 8.1* 8.4* 8.2* 8.1*  MG 2.2 2.1 2.0 2.0 1.9  --   PHOS 1.9* 2.3* 2.8 2.9 3.4  --    GFR: Estimated Creatinine Clearance: 45.5 mL/min (by C-G formula based on SCr of 0.56 mg/dL). Liver Function Tests: Recent Labs  Lab 01/15/19 0522 01/16/19 0522 01/17/19 0436 01/20/19 0417  AST 17 19 19 27   ALT 13 13 17 28   ALKPHOS 39 43 41 47  BILITOT 0.2* 0.4 0.4 0.3  PROT 5.5* 5.6* 5.5* 5.4*  ALBUMIN 2.6* 2.6* 2.6* 2.6*   No results for input(s): LIPASE, AMYLASE in the last 168 hours. No results for input(s): AMMONIA in the last 168 hours. Coagulation Profile: No results for input(s): INR, PROTIME in the last 168 hours. Cardiac Enzymes: No results for input(s): CKTOTAL, CKMB, CKMBINDEX, TROPONINI in the last 168 hours. BNP (last 3 results) No results for input(s): PROBNP in the last 8760 hours. HbA1C: No results for input(s): HGBA1C in the last 72 hours. CBG: Recent Labs  Lab 01/20/19 1332 01/20/19 1723 01/20/19 2300 01/21/19 0621 01/21/19 1154  GLUCAP 140* 132* 128* 142* 165*   Lipid Profile: Recent Labs    01/20/19 0417  TRIG 109   Thyroid Function Tests: No results for input(s): TSH, T4TOTAL, FREET4, T3FREE, THYROIDAB in the last 72 hours. Anemia Panel: No results for input(s): VITAMINB12, FOLATE, FERRITIN, TIBC,  IRON, RETICCTPCT in the last 72 hours.    Radiology Studies: I have reviewed all of the imaging during this hospital visit personally     Scheduled Meds: . heparin  5,000 Units Subcutaneous Q8H  . insulin aspart  0-15 Units Subcutaneous Q6H  . latanoprost  1 drop Both Eyes QHS  . levothyroxine  25 mcg Intravenous Daily  . loratadine  10 mg Oral Daily  . pantoprazole (PROTONIX) IV  40 mg Intravenous Q24H  . sodium chloride flush  10-40 mL Intracatheter Q12H  . sucralfate  1 g Oral TID WC & HS   Continuous Infusions: . lactated ringers 10 mL/hr at 01/21/19 0926  . TPN ADULT (ION) 75 mL/hr at 01/20/19 1805  . TPN ADULT (ION)       LOS: 9 days        Christie Weedman Gerome Apley, MD

## 2019-01-22 LAB — GLUCOSE, CAPILLARY
Glucose-Capillary: 124 mg/dL — ABNORMAL HIGH (ref 70–99)
Glucose-Capillary: 143 mg/dL — ABNORMAL HIGH (ref 70–99)
Glucose-Capillary: 168 mg/dL — ABNORMAL HIGH (ref 70–99)

## 2019-01-22 LAB — BASIC METABOLIC PANEL
Anion gap: 8 (ref 5–15)
BUN: 34 mg/dL — ABNORMAL HIGH (ref 8–23)
CO2: 25 mmol/L (ref 22–32)
Calcium: 8.1 mg/dL — ABNORMAL LOW (ref 8.9–10.3)
Chloride: 105 mmol/L (ref 98–111)
Creatinine, Ser: 0.61 mg/dL (ref 0.44–1.00)
GFR calc Af Amer: >60 mL/min (ref >60)
GFR calc non Af Amer: >60 mL/min (ref >60)
Glucose, Bld: 131 mg/dL — ABNORMAL HIGH (ref 70–99)
Potassium: 4.1 mmol/L (ref 3.5–5.1)
Sodium: 138 mmol/L (ref 135–145)

## 2019-01-22 LAB — CBC WITH DIFFERENTIAL/PLATELET
Abs Immature Granulocytes: 0.32 10*3/uL — ABNORMAL HIGH (ref 0.00–0.07)
Basophils Absolute: 0 10*3/uL (ref 0.0–0.1)
Basophils Relative: 0 %
Eosinophils Absolute: 0.1 10*3/uL (ref 0.0–0.5)
Eosinophils Relative: 2 %
HCT: 29.9 % — ABNORMAL LOW (ref 36.0–46.0)
Hemoglobin: 9 g/dL — ABNORMAL LOW (ref 12.0–15.0)
Immature Granulocytes: 4 %
Lymphocytes Relative: 12 %
Lymphs Abs: 0.9 10*3/uL (ref 0.7–4.0)
MCH: 25.6 pg — ABNORMAL LOW (ref 26.0–34.0)
MCHC: 30.1 g/dL (ref 30.0–36.0)
MCV: 85.2 fL (ref 80.0–100.0)
Monocytes Absolute: 0.7 10*3/uL (ref 0.1–1.0)
Monocytes Relative: 10 %
Neutro Abs: 5.3 10*3/uL (ref 1.7–7.7)
Neutrophils Relative %: 72 %
Platelets: 280 10*3/uL (ref 150–400)
RBC: 3.51 MIL/uL — ABNORMAL LOW (ref 3.87–5.11)
RDW: 16.2 % — ABNORMAL HIGH (ref 11.5–15.5)
WBC: 7.3 10*3/uL (ref 4.0–10.5)
nRBC: 0 % (ref 0.0–0.2)

## 2019-01-22 LAB — MAGNESIUM: Magnesium: 2.2 mg/dL (ref 1.7–2.4)

## 2019-01-22 MED ORDER — SUCRALFATE 1 G PO TABS
1.0000 g | ORAL_TABLET | ORAL | Status: DC
  Administered 2019-01-22 – 2019-01-24 (×5): 1 g via ORAL
  Filled 2019-01-22 (×8): qty 1

## 2019-01-22 MED ORDER — LEVOTHYROXINE SODIUM 50 MCG PO TABS
50.0000 ug | ORAL_TABLET | Freq: Every day | ORAL | Status: DC
  Administered 2019-01-22 – 2019-01-24 (×3): 50 ug via ORAL
  Filled 2019-01-22 (×3): qty 1

## 2019-01-22 MED ORDER — TRAVASOL 10 % IV SOLN
INTRAVENOUS | Status: AC
  Administered 2019-01-22: 18:00:00 via INTRAVENOUS
  Filled 2019-01-22: qty 528

## 2019-01-22 MED ORDER — TRAVASOL 10 % IV SOLN
INTRAVENOUS | Status: DC
  Filled 2019-01-22: qty 990

## 2019-01-22 MED ORDER — TRAVASOL 10 % IV SOLN
INTRAVENOUS | Status: AC
  Filled 2019-01-22: qty 528

## 2019-01-22 MED ORDER — ENSURE SURGERY PO LIQD
237.0000 mL | Freq: Two times a day (BID) | ORAL | Status: DC
  Administered 2019-01-23: 237 mL via ORAL
  Filled 2019-01-22 (×3): qty 237

## 2019-01-22 NOTE — Progress Notes (Signed)
PROGRESS NOTE    Christie Hinton  HKV:[id number] DOB: [birth year] DOA: 01/12/2019 PCP: Belia Heman, NP    Brief Narrative:  85 year old female who presented abdominal pain, nausea or vomiting. She does have significant past medical history for hypertension, dyslipidemia and GERD. She reported generalized abdominal pain after a meal, associated with vomiting. She presented to the hospital due to progressive abdominal pain. On her initial physical examination her temperature was 97.7, heart rate 76, blood pressure 166/82, oxygen saturation 99%. Her lungs are clear to auscultation bilaterally, heart S1-S2 present rhythmic, the abdomen was soft, generalized abdominal pain to palpation, hypoactive bowel sounds, no lower extremity edema. CT of the abdomen with a small bowel obstruction, dilated loops of small bowel in the left abdomen with mesenteric edema and a well-defined transition point.Findings concerning for closed-loop obstruction, possibly related to an internal hernia.  Patient was admitted to the hospital working diagnosis of small bowel obstruction.   Assessment & Plan:   Principal Problem:   SBO (small bowel obstruction) s/p lysis of adhesions 01/14/2019 Active Problems:   Essential hypertension   Mixed hyperlipidemia   1. Small bowel obstruction. Tolerating po well, will continue proton pump inhibitor bid po and sucralfate for dyspepsia. Continue as needed analgesics, out of bed as tolerated. Follow with physical therapy recommendations. Antibiotic therapy has been discontinued. Follow surgery recommendations.   2. Hypokalemia, hypophosphatemia. Electrolytes have been corrected with K at 4,1. Serum bicarbonate at 25, and renal function with stable serum cr at 0.61. Patient receiving TPN per pharmacy protocol.   3. HTN. Continue blood pressure monitoring.   4. Hypothyroid. Continue with levothyroxine by mouth.  5. Dyslipidemia. Tolerating well statin.   6. T2DM.  Fasting glucose is 131, will continue glucose cover and monitoring with insulin sliding.   DVT prophylaxis:enoxaparin Code Status:full Family Communication:no family at the bedside Disposition Plan/ discharge barriers:pending clinical improvement  Body mass index is 25.21 kg/m. Malnutrition Type:  Nutrition Problem: Inadequate oral intake Etiology: inability to eat   Malnutrition Characteristics:     Nutrition Interventions:  Interventions: TPN  RN Pressure Injury Documentation:     Consultants:   Surgery   Procedures:   Exploratory laparotomy lysis adhesions.   Antimicrobials:      Subjective: Patient continue to have abdominal pain, left upper quadrant, improved in intensity but not yet back to baseline, this am with no nausea or vomiting.   Objective: Vitals:   01/21/19 1349 01/21/19 2112 01/22/19 0456 01/22/19 0814  BP: 119/69 121/64 121/61 107/66  Pulse: 81 78 77 72  Resp: 18 18 18 18   Temp: 98.2 F (36.8 C) 98.7 F (37.1 C) 99.1 F (37.3 C) 98.5 F (36.9 C)  TempSrc: Oral Oral Oral Oral  SpO2: 98% 97% 97% 97%  Weight:      Height:        Intake/Output Summary (Last 24 hours) at 01/22/2019 1132 Last data filed at 01/22/2019 0706 Gross per 24 hour  Intake 2105.98 ml  Output 1400 ml  Net 705.98 ml   Filed Weights   01/12/19 0929 01/21/19 1215  Weight: 67.6 kg 71.9 kg    Examination:   General: deconditioned  Neurology: Awake and alert, non focal  E ENT: mild pallor, no icterus, oral mucosa moist Cardiovascular: No JVD. S1-S2 present, rhythmic, no gallops, rubs, or murmurs. No lower extremity edema. Pulmonary: positive breath sounds bilaterally, adequate air movement, no wheezing, rhonchi or rales. Gastrointestinal. Abdomen distended, mild tender to deep palpation at the left upper quadrant,  with no organomegaly, no rebound or guarding. Positive hyperactive bowel sounds. Surgical wound clean.  Skin. No rashes  Musculoskeletal: no joint deformities     Data Reviewed: I have personally reviewed following labs and imaging studies  CBC: Recent Labs  Lab 01/16/19 0155 01/20/19 0417 01/21/19 0316 01/22/19 0327  WBC 7.4 6.2 7.0 7.3  NEUTROABS  --  4.0 4.6 5.3  HGB 10.6* 9.8* 9.1* 9.0*  HCT 34.3* 31.9* 30.5* 29.9*  MCV 84.5 84.4 85.0 85.2  PLT 328 292 307 280   Basic Metabolic Panel: Recent Labs  Lab 01/16/19 0522 01/17/19 0436 01/18/19 0354 01/19/19 0530 01/20/19 0417 01/21/19 0316 01/22/19 0327  NA 135 137 138 138 138 138 138  K 3.7 3.6 4.0 3.9 3.9 3.9 4.1  CL 100 102 106 108 106 107 105  CO2 26 26 27 27 25 24 25   GLUCOSE 155* 178* 173* 147* 127* 120* 131*  BUN 35* 30* 26* 30* 38* 34* 34*  CREATININE 0.71 0.61 0.60 0.55 0.64 0.56 0.61  CALCIUM 8.1* 8.1* 8.1* 8.4* 8.2* 8.1* 8.1*  MG 2.2 2.1 2.0 2.0 1.9  --  2.2  PHOS 1.9* 2.3* 2.8 2.9 3.4  --   --    GFR: Estimated Creatinine Clearance: 45.5 mL/min (by C-G formula based on SCr of 0.61 mg/dL). Liver Function Tests: Recent Labs  Lab 01/16/19 0522 01/17/19 0436 01/20/19 0417  AST 19 19 27   ALT 13 17 28   ALKPHOS 43 41 47  BILITOT 0.4 0.4 0.3  PROT 5.6* 5.5* 5.4*  ALBUMIN 2.6* 2.6* 2.6*   No results for input(s): LIPASE, AMYLASE in the last 168 hours. No results for input(s): AMMONIA in the last 168 hours. Coagulation Profile: No results for input(s): INR, PROTIME in the last 168 hours. Cardiac Enzymes: No results for input(s): CKTOTAL, CKMB, CKMBINDEX, TROPONINI in the last 168 hours. BNP (last 3 results) No results for input(s): PROBNP in the last 8760 hours. HbA1C: No results for input(s): HGBA1C in the last 72 hours. CBG: Recent Labs  Lab 01/21/19 0621 01/21/19 1154 01/21/19 1735 01/21/19 2338 01/22/19 0604  GLUCAP 142* 165* 135* 146* 143*   Lipid Profile: Recent Labs    01/20/19 0417  TRIG 109   Thyroid Function Tests: No results for input(s): TSH, T4TOTAL, FREET4, T3FREE, THYROIDAB in the last  72 hours. Anemia Panel: No results for input(s): VITAMINB12, FOLATE, FERRITIN, TIBC, IRON, RETICCTPCT in the last 72 hours.    Radiology Studies: I have reviewed all of the imaging during this hospital visit personally     Scheduled Meds: . heparin  5,000 Units Subcutaneous Q8H  . insulin aspart  0-15 Units Subcutaneous Q6H  . latanoprost  1 drop Both Eyes QHS  . levothyroxine  50 mcg Oral Q0600  . loratadine  10 mg Oral Daily  . pantoprazole  40 mg Oral BID  . sodium chloride flush  10-40 mL Intracatheter Q12H  . sucralfate  1 g Oral 4 times per day   Continuous Infusions: . lactated ringers 10 mL/hr at 01/21/19 0926  . TPN ADULT (ION)    . TPN ADULT (ION)       LOS: 10 days        Cher Franzoni Annett Gulaaniel Jora Galluzzo, MD '

## 2019-01-22 NOTE — Progress Notes (Signed)
Nutrition Follow-up  INTERVENTION:   TPN per Pharmacy Will continue to monitor for nutrition needs  NUTRITION DIAGNOSIS:   Inadequate oral intake related to inability to eat as evidenced by NPO status.  Now on regular diet.  GOAL:   Patient will meet greater than or equal to 90% of their needs   Progressing.  MONITOR:   PO intake, Labs, Weight trends, I & O's  ASSESSMENT:   85 year old female with history of HTN, hyperlipidemia, and GERD who presented initially to [facility] with complaints of generalized abdominal pain and vomiting that started after dinner. Patient reported lower abdominal pain which progressively got worse and developed N/V after that. Last bowel movement was 2 days before admission. CT abdomen showed SBO with dilated loops of small bowel. Findings concerning for closed-loop obstruction. General surgery consulted.  **RD working remotely**  Patient now on regular diet as of 6/16. No PO documented yet. Per surgery note, pt tolerating diet so far. Plan is for TPN to be weaned to 1/2 rate today at 40 ml/hr (provides 988 kcal and 52g protein). Will continue to monitor for PO intakes and need for supplements. Pt was eating well PTA.   Per weight records, weight has increased +9 lbs since 6/7. Per I/O's: +8.5L since admit.  Medications: Carafate tablet QID Labs reviewed: CBGs: 143-168  Diet Order:   Diet Order            Diet regular Room service appropriate? Yes; Fluid consistency: Thin  Diet effective now              EDUCATION NEEDS:   No education needs have been identified at this time  Skin:  Skin Assessment: Skin Integrity Issues: Skin Integrity Issues:: Incisions Incisions: abdomen (6/9)  Last BM:  6/16  Height:   Ht Readings from Last 1 Encounters:  01/12/19 5' 6.5" (1.689 m)    Weight:   Wt Readings from Last 1 Encounters:  01/21/19 71.9 kg    Ideal Body Weight:  60.2 kg  BMI:  Body mass index is 25.21  kg/m.  Estimated Nutritional Needs:   Kcal:  1750-2025 kcal  Protein:  80-95 grams  Fluid:  >/= 1.8 L/day  Clayton Bibles, MS, RD, LDN [place] Diet[facility] Pager: [phone] After Hours Pager: [phone]

## 2019-01-22 NOTE — Progress Notes (Signed)
8 Days Post-Op  Subjective: CC:  Patient reports she is feeling better from yesterday.  Having BM's and tolerating reg diet without any N/V.  Denies pain.  Ambulating with PT  Objective: Vital signs in last 24 hours: Temp:  [98.2 F (36.8 C)-99.1 F (37.3 C)] 98.5 F (36.9 C) (06/17 0814) Pulse Rate:  [72-81] 72 (06/17 0814) Resp:  [18] 18 (06/17 0814) BP: (107-121)/(61-69) 107/66 (06/17 0814) SpO2:  [97 %-98 %] 97 % (06/17 0814) Weight:  [71.9 kg] 71.9 kg (06/16 1215) Last BM Date: 01/21/19  Intake/Output from previous day: 06/16 0701 - 06/17 0700 In: 2564.5 [P.O.:360; I.V.:2004.5; IV Piggyback:200] Out: 1500 [Urine:1500] Intake/Output this shift: Total I/O In: -  Out: 100 [Urine:100]  PE: Gen: Awake and alert, NAD Heart: regular, murmur Lungs: Normal rate and effort, pulling ~750-900 on IS Abd: Soft, min distension, tenderness appropriately around her midline incision. Midline incision with staples intact. No wound separation. Some mild erythema surrounding staples that favors irritation/reactive inflammation rather and is stable Msk: no edema  Lab Results:  Recent Labs    01/21/19 0316 01/22/19 0327  WBC 7.0 7.3  HGB 9.1* 9.0*  HCT 30.5* 29.9*  PLT 307 280   BMET Recent Labs    01/21/19 0316 01/22/19 0327  NA 138 138  K 3.9 4.1  CL 107 105  CO2 24 25  GLUCOSE 120* 131*  BUN 34* 34*  CREATININE 0.56 0.61  CALCIUM 8.1* 8.1*   PT/INR No results for input(s): LABPROT, INR in the last 72 hours. CMP     Component Value Date/Time   NA 138 01/22/2019 0327   K 4.1 01/22/2019 0327   CL 105 01/22/2019 0327   CO2 25 01/22/2019 0327   GLUCOSE 131 (H) 01/22/2019 0327   BUN 34 (H) 01/22/2019 0327   CREATININE 0.61 01/22/2019 0327   CALCIUM 8.1 (L) 01/22/2019 0327   PROT 5.4 (L) 01/20/2019 0417   ALBUMIN 2.6 (L) 01/20/2019 0417   AST 27 01/20/2019 0417   ALT 28 01/20/2019 0417   ALKPHOS 47 01/20/2019 0417   BILITOT 0.3 01/20/2019 0417   GFRNONAA  >60 01/22/2019 0327   GFRAA >60 01/22/2019 0327   Lipase     Component Value Date/Time   LIPASE 29 01/12/2019 0928       Studies/Results: No results found.  Anti-infectives: Anti-infectives (From admission, onward)   Start     Dose/Rate Route Frequency Ordered Stop   01/14/19 1103  sodium chloride 0.9 % with cefoTEtan (CEFOTAN) ADS Med    Note to Pharmacy: Vevelyn RoyalsHarvell, Gwendolyn  : cabinet override      01/14/19 1103 01/14/19 1124   01/14/19 1045  cefoTEtan (CEFOTAN) 2 g in sodium chloride 0.9 % 100 mL IVPB     2 g 200 mL/hr over 30 Minutes Intravenous  Once 01/14/19 1044 01/14/19 1124       Assessment/Plan Thyroid disease HTN HLD DM  - above per medicine team Protein Calorie Malnutrition - Pre-albumin 8.4 (6/10)>>14.1(6/15)  SBO S/p Ex Lap with LOA for SBO due to Omental Adhesion, internal hernia - Dr. Derrell Lollingamirez - 01/14/2019 - POD #8 - Advance to CLD - half TPN - Mobilize for bowel function - Pulm toliet - Keep K>4 and Mg >2 for bowel function  FEN - TPN, advance to reg diet today VTE - Heparin, SCDs, Mobilize  ID - Cefotetan periop. None currently. Afebrile. WBC 6,200 Foley - None  Follow up - Dr. Derrell Lollingamirez  POC - Son -  Ed Josepha Pigg [phone])  - Per note from Dr. Lucia Gaskins on 6/14, patient's son and daughter plan to be with her when she returns home   Plan: She is tolerating a diet without n/v. Will continue PO.  Wean TPN down.  Could probably d/c towards the end of the week if she continues to progress.  D/c skin staples on day of discharge   LOS: 10 days     Rosario Adie, MD  [facility]

## 2019-01-22 NOTE — Progress Notes (Signed)
PT Cancellation Note  Patient Details Name: Christie Hinton MRN: [id number] DOB: 11-07-1927   Cancelled Treatment:    Reason Eval/Treat Not Completed: Attempted tx session-pt declined to participate 2* not feeling well. She did report ambulating with nursing this a.m. Will check back another day.   Weston Anna, PT Acute Rehabilitation Services Pager: [phone] Office: [phone]

## 2019-01-22 NOTE — Progress Notes (Addendum)
PHARMACY - ADULT TOTAL PARENTERAL NUTRITION CONSULT NOTE   Pharmacy Consult for TPN Indication: Post-operative ileus following surgery for SBO  Patient Measurements: Height: 5' 6.5" (168.9 cm) Weight: 158 lb 9 oz (71.9 kg) IBW/kg (Calculated) : 60.45 TPN AdjBW (KG): 67.6 Body mass index is 25.21 kg/m.   Vitals:   01/22/19 0456 01/22/19 0814  BP: 121/61 107/66  Pulse: 77 72  Resp: 18 18  Temp: 99.1 F (37.3 C) 98.5 F (36.9 C)  SpO2: 97% 97%    Intake/Output Summary (Last 24 hours) at 01/22/2019 0818 Last data filed at 01/22/2019 0706 Gross per 24 hour  Intake 2564.5 ml  Output 1600 ml  Net 964.5 ml    Recent Labs    01/20/19 0417 01/21/19 0316 01/22/19 0327  NA 138 138 138  K 3.9 3.9 4.1  CL 106 107 105  CO2 25 24 25   GLUCOSE 127* 120* 131*  BUN 38* 34* 34*  CREATININE 0.64 0.56 0.61  CALCIUM 8.2* 8.1* 8.1*  PHOS 3.4  --   --   MG 1.9  --  2.2  ALBUMIN 2.6*  --   --   ALKPHOS 47  --   --   AST 27  --   --   ALT 28  --   --   BILITOT 0.3  --   --   TRIG 109  --   --   PREALBUMIN 14.1*  --   --   Corr Ca 6/17 = 9.2  Insulin Requirements: 11 units SSI last 24hr with 20 units insulin added to TPN bag - Hx of DM (diet controlled) noted.  Current Nutrition: Reg diet ordered 6/16; starting with clear liquids  IVF: LR 10 mL/hr  Central access: PICC  TPN start date: 6/10  ASSESSMENT                                                                                        HPI: 85 y/o F with SBO underwent laparotomy and lysis of adhesions on 6.9.20, now on TPN.  Hx of DM (diet-controlled) noted.  Significant events:  6/14: continue TPN, start CL soon and per CCS note may start to wean TPN soon if tolerated 6/16: started clear liquids  Today:   Glucose (goal CBG<150) -  Insulin in TPN 20 units - CBGs since new TPN started last PM: 131 - 146  (patient required a total of 11 units insulin aspart over the last 24 hours)  Electrolytes - K now > 4 and Mg > 2 (as  per MD goals to support return of bowel function) after KCl runs x 2 and increasing Mg in TPN to 2510mEq/L yesterday.    Renal - stable  LFTs - all < ULN  TGs - 109 (6/15)  Prealbumin - increased from 8.4 (6/10) to 14.1 (6/15)  NUTRITIONAL GOALS  KCal:    1751-0258 / day Protein: 80-95 g /day  Goal TPN rate is 75 ml/hr (provides 1854 KCal/day, 99 g protein/day, 1.8 L/day)  PLAN                                                                                                         Continue custom TPN at goal rate of 75 ml/hr.  Continue 20 units insulin in TPN TPN to contain standard multivitamins daily and trace elements MWF. Continue SSI Novolog moderate scale q6h Continue mIVF at 10 mL/hr TPN lab panels tomorrow, and Mondays & Thursdays. Follow tolerance of PO diet and potential future plans for transitioning off TPN  Thank you for allowing pharmacy to be a part of this patient's care.  Clayburn Pert, PharmD, BCPS [phone] 01/22/2019  8:18 AM  ================================= UPDATE: Beginning reg diet, received request from surgery to reduce TPN to ~1/2 rate.  Will reduce TPN rate to 40 mL/hr--  Clayburn Pert, PharmD, BCPS [phone] 01/22/2019  10:19 AM

## 2019-01-23 ENCOUNTER — Inpatient Hospital Stay (HOSPITAL_COMMUNITY): Payer: Medicare Other

## 2019-01-23 LAB — COMPREHENSIVE METABOLIC PANEL
ALT: 28 U/L (ref 0–44)
AST: 24 U/L (ref 15–41)
Albumin: 2.3 g/dL — ABNORMAL LOW (ref 3.5–5.0)
Alkaline Phosphatase: 51 U/L (ref 38–126)
Anion gap: 7 (ref 5–15)
BUN: 26 mg/dL — ABNORMAL HIGH (ref 8–23)
CO2: 25 mmol/L (ref 22–32)
Calcium: 7.9 mg/dL — ABNORMAL LOW (ref 8.9–10.3)
Chloride: 103 mmol/L (ref 98–111)
Creatinine, Ser: 0.64 mg/dL (ref 0.44–1.00)
GFR calc Af Amer: >60 mL/min (ref >60)
GFR calc non Af Amer: >60 mL/min (ref >60)
Glucose, Bld: 107 mg/dL — ABNORMAL HIGH (ref 70–99)
Potassium: 4.1 mmol/L (ref 3.5–5.1)
Sodium: 135 mmol/L (ref 135–145)
Total Bilirubin: 0.5 mg/dL (ref 0.3–1.2)
Total Protein: 5.1 g/dL — ABNORMAL LOW (ref 6.5–8.1)

## 2019-01-23 LAB — GLUCOSE, CAPILLARY
Glucose-Capillary: 106 mg/dL — ABNORMAL HIGH (ref 70–99)
Glucose-Capillary: 118 mg/dL — ABNORMAL HIGH (ref 70–99)
Glucose-Capillary: 137 mg/dL — ABNORMAL HIGH (ref 70–99)

## 2019-01-23 LAB — PHOSPHORUS: Phosphorus: 3.3 mg/dL (ref 2.5–4.6)

## 2019-01-23 LAB — MAGNESIUM: Magnesium: 2 mg/dL (ref 1.7–2.4)

## 2019-01-23 MED ORDER — ACETAMINOPHEN 650 MG RE SUPP: 650.0000 mg | Freq: Four times a day (QID) | RECTAL | Status: DC | PRN

## 2019-01-23 MED ORDER — MENTHOL 3 MG MT LOZG
1.0000 | LOZENGE | OROMUCOSAL | Status: DC | PRN
  Filled 2019-01-23: qty 9

## 2019-01-23 MED ORDER — PANTOPRAZOLE SODIUM 40 MG IV SOLR
40.0000 mg | INTRAVENOUS | Status: DC
  Administered 2019-01-23: 40 mg via INTRAVENOUS
  Filled 2019-01-23: qty 40

## 2019-01-23 MED ORDER — ACETAMINOPHEN 325 MG PO TABS
650.0000 mg | ORAL_TABLET | Freq: Four times a day (QID) | ORAL | Status: DC | PRN
  Administered 2019-02-11: 650 mg via ORAL
  Filled 2019-01-23: qty 2

## 2019-01-23 MED ORDER — BISACODYL 10 MG RE SUPP
10.0000 mg | Freq: Once | RECTAL | Status: AC
  Administered 2019-01-23: 10 mg via RECTAL
  Filled 2019-01-23: qty 1

## 2019-01-23 MED ORDER — TRAVASOL 10 % IV SOLN
INTRAVENOUS | Status: AC
  Administered 2019-01-23: 18:00:00 via INTRAVENOUS
  Filled 2019-01-23: qty 528

## 2019-01-23 MED ORDER — MORPHINE SULFATE (PF) 2 MG/ML IV SOLN: 0.5000 mg | INTRAVENOUS | Status: DC | PRN

## 2019-01-23 NOTE — Progress Notes (Signed)
Unable to get NG tube in place per two RNs. Dr Lucia Gaskins notified. Donne Hazel, RN

## 2019-01-23 NOTE — Progress Notes (Signed)
PHARMACY - ADULT TOTAL PARENTERAL NUTRITION CONSULT NOTE   Pharmacy Consult for TPN Indication: Post-operative ileus following surgery for SBO  Patient Measurements: Height: 5' 6.5" (168.9 cm) Weight: 158 lb 9 oz (71.9 kg) IBW/kg (Calculated) : 60.45 TPN AdjBW (KG): 67.6 Body mass index is 25.21 kg/m.   Vitals:   01/22/19 2056 01/23/19 0530  BP: 133/65 126/65  Pulse: 89 75  Resp: 18 16  Temp: 98 F (36.7 C) 98.5 F (36.9 C)  SpO2: 98% 95%    Intake/Output Summary (Last 24 hours) at 01/23/2019 1118 Last data filed at 01/23/2019 0600 Gross per 24 hour  Intake 939.71 ml  Output 650 ml  Net 289.71 ml    Recent Labs    01/22/19 0327 01/23/19 0419  NA 138 135  K 4.1 4.1  CL 105 103  CO2 25 25  GLUCOSE 131* 107*  BUN 34* 26*  CREATININE 0.61 0.64  CALCIUM 8.1* 7.9*  PHOS  --  3.3  MG 2.2 2.0  ALBUMIN  --  2.3*  ALKPHOS  --  51  AST  --  24  ALT  --  28  BILITOT  --  0.5  Corr Ca 6/17 = 9.2  Insulin Requirements: 5 units SSI last 24hr with 20 units insulin added to TPN bag - Hx of DM (diet controlled) noted.  Current Nutrition: Reg diet ordered 6/16; starting with clear liquids  IVF: none  Central access: PICC  TPN start date: 6/10  ASSESSMENT                                                                                        HPI: 85 y/o F with SBO underwent laparotomy and lysis of adhesions on 6.9.20, now on TPN.  Hx of DM (diet-controlled) noted.  Significant events:  6/14: continue TPN, start CL soon and per CCS note may start to wean TPN soon if tolerated 6/16: started clear liquids 6/17: TPN reduced to 1/2 rate per surgery  Today:   Glucose (goal CBG<150) -  Insulin in TPN 20 units - CBGs at goal  Electrolytes - at goal.  ( Goal Mag >2 , K>4)  Renal - stable  LFTs - all < ULN  TGs - 109 (6/15)  Prealbumin - increased from 8.4 (6/10) to 14.1 (6/15)  24h calorie count in progress  NUTRITIONAL GOALS                                                                            KCal:    4098-11911750-2025 / day Protein: 80-95 g /day  Goal TPN rate is 75 ml/hr (provides 1854 KCal/day, 99 g protein/day, 1.8 L/day)  PLAN  Continue custom TPN at reduced rate of 40 ml/hr.  Continue 20 units insulin in TPN TPN to contain standard multivitamins daily and trace elements MWF. Continue SSI Novolog moderate scale q6h IVF per MD- currently none TPN lab panels on Mondays & Thursdays. Follow tolerance of PO diet and potential future plans for transitioning off TPN  Thank you for allowing pharmacy to be a part of this patient's care.  Netta Cedars, PharmD, BCPS 01/23/2019  11:18 AM

## 2019-01-23 NOTE — Progress Notes (Signed)
9 Days Post-Op  Subjective: CC:  Patient reports she is feeling better from yesterday.  Having BM's and tolerating reg diet without any N/V.  Denies pain.  Ambulating with PT.  No BM in the past couple days but passing flatus  Objective: Vital signs in last 24 hours: Temp:  [98 F (36.7 C)-98.5 F (36.9 C)] 98.5 F (36.9 C) (06/18 0530) Pulse Rate:  [75-89] 75 (06/18 0530) Resp:  [16-18] 16 (06/18 0530) BP: (114-133)/(65-76) 126/65 (06/18 0530) SpO2:  [95 %-98 %] 95 % (06/18 0530) Last BM Date: 01/21/19  Intake/Output from previous day: 06/17 0701 - 06/18 0700 In: 939.7 [P.O.:340; I.V.:599.7] Out: 1000 [Urine:1000] Intake/Output this shift: No intake/output data recorded.  PE: Gen: Awake and alert, NAD Abd: Soft, moderate distension, tenderness appropriately around her midline incision. Midline incision with staples intact. No wound separation. Some mild erythema surrounding staples that favors irritation/reactive inflammation rather and is stable Msk: no edema  Lab Results:  Recent Labs    01/21/19 0316 01/22/19 0327  WBC 7.0 7.3  HGB 9.1* 9.0*  HCT 30.5* 29.9*  PLT 307 280   BMET Recent Labs    01/22/19 0327 01/23/19 0419  NA 138 135  K 4.1 4.1  CL 105 103  CO2 25 25  GLUCOSE 131* 107*  BUN 34* 26*  CREATININE 0.61 0.64  CALCIUM 8.1* 7.9*   PT/INR No results for input(s): LABPROT, INR in the last 72 hours. CMP     Component Value Date/Time   NA 135 01/23/2019 0419   K 4.1 01/23/2019 0419   CL 103 01/23/2019 0419   CO2 25 01/23/2019 0419   GLUCOSE 107 (H) 01/23/2019 0419   BUN 26 (H) 01/23/2019 0419   CREATININE 0.64 01/23/2019 0419   CALCIUM 7.9 (L) 01/23/2019 0419   PROT 5.1 (L) 01/23/2019 0419   ALBUMIN 2.3 (L) 01/23/2019 0419   AST 24 01/23/2019 0419   ALT 28 01/23/2019 0419   ALKPHOS 51 01/23/2019 0419   BILITOT 0.5 01/23/2019 0419   GFRNONAA >60 01/23/2019 0419   GFRAA >60 01/23/2019 0419   Lipase     Component Value Date/Time    LIPASE 29 01/12/2019 0928       Studies/Results: No results found.  Anti-infectives: Anti-infectives (From admission, onward)   Start     Dose/Rate Route Frequency Ordered Stop   01/14/19 1103  sodium chloride 0.9 % with cefoTEtan (CEFOTAN) ADS Med    Note to Pharmacy: Vevelyn RoyalsHarvell, Gwendolyn  : cabinet override      01/14/19 1103 01/14/19 1124   01/14/19 1045  cefoTEtan (CEFOTAN) 2 g in sodium chloride 0.9 % 100 mL IVPB     2 g 200 mL/hr over 30 Minutes Intravenous  Once 01/14/19 1044 01/14/19 1124       Assessment/Plan Thyroid disease HTN HLD DM  - above per medicine team Protein Calorie Malnutrition - Pre-albumin 8.4 (6/10)>>14.1(6/15)  SBO S/p Ex Lap with LOA for SBO due to Omental Adhesion, internal hernia - Dr. Derrell Lollingamirez - 01/14/2019 - POD #9 - suppository for constipation - Mobilize for bowel function - Pulm toliet - Keep K>4 and Mg >2 for bowel function  FEN - TPN, reg diet as tolerated VTE - Heparin, SCDs, Mobilize  ID - Cefotetan periop. None currently. Afebrile. WBC 6,200 Foley - None  Follow up - Dr. Derrell Lollingamirez  POC - Son - Rockie Neighboursd Bly [phone])  - Per note from Dr. Ezzard StandingNewman on 6/14, patient's son and daughter plan to be  with her when she returns home   Plan: She is tolerating a diet without n/v. Will continue PO.  Wean TPN down.  Getting calorie count now to check her PO intake.  If adequate, can probably stop TPN   D/c skin staples on day of discharge   LOS: 11 days     Rosario Adie, MD  [facility]

## 2019-01-23 NOTE — Progress Notes (Signed)
PROGRESS NOTE    Anastasio ChampionMarian Scaff  WUJ:[id number] DOB: September 11, 1927 DOA: 01/12/2019 PCP: Sharren BridgeSellers, Billie H, NP    Brief Narrative:  85 year old female who presented abdominal pain, nausea or vomiting. She does have significant past medical history for hypertension, dyslipidemia and GERD. She reported generalized abdominal pain after a meal, associated with vomiting. She presented to the hospital due to progressive abdominal pain. On her initial physical examination her temperature was 97.7, heart rate 76, blood pressure 166/82, oxygen saturation 99%. Her lungs are clear to auscultation bilaterally, heart S1-S2 present rhythmic, the abdomen was soft, generalized abdominal pain to palpation, hypoactive bowel sounds, no lower extremity edema. CT of the abdomen with a small bowel obstruction, dilated loops of small bowel in the left abdomen with mesenteric edema and a well-defined transition point.Findings concerning for closed-loop obstruction, possibly related to an internal hernia.  Patient was admitted to the hospital working diagnosis of small bowel obstruction.   Assessment & Plan:   Principal Problem:   SBO (small bowel obstruction) s/p lysis of adhesions 01/14/2019 Active Problems:   Essential hypertension   Mixed hyperlipidemia   1. Small bowel obstruction.Improved po intake, tolerating well proton pump inhibitor bid po and sucralfate. Has declined physical therapy yesterday. Calorie count, follow with surgical recommendations. Out of bed tid with meals to chair. Ambulate with physical therapy.   2. Hypokalemia, hypophosphatemia. K at 4,1 and Mg at 2,0, will continue to follow on renal function in 48H.  Slowly improving pop intake, patient on TPN.   3. HTN.Controlled blood pressure, systolic 114 mmHg.    4. Hypothyroid.On levothyroxine.   5. Dyslipidemia.Continue statin.  6. T2DM. Fasting glucose is 107, insulin sliding scale for glucose cover and monitoring.   DVT  prophylaxis:enoxaparin Code Status:full Family Communication:no family at the bedside Disposition Plan/ discharge barriers:pending clinical improvement  Body mass index is 25.21 kg/m. Malnutrition Type:  Nutrition Problem: Inadequate oral intake Etiology: inability to eat   Malnutrition Characteristics:  Signs/Symptoms: NPO status   Nutrition Interventions:  Interventions: TPN, Refer to RD note for recommendations  RN Pressure Injury Documentation:     Consultants:     Procedures:     Antimicrobials:       Subjective: Patient feeling better but continue to have abdominal distention, no nausea or vomiting, no chest pain or dyspnea.   Objective: Vitals:   01/22/19 0814 01/22/19 1319 01/22/19 2056 01/23/19 0530  BP: 107/66 114/76 133/65 126/65  Pulse: 72 82 89 75  Resp: 18 16 18 16   Temp: 98.5 F (36.9 C) 98.2 F (36.8 C) 98 F (36.7 C) 98.5 F (36.9 C)  TempSrc: Oral Oral Oral Oral  SpO2: 97% 95% 98% 95%  Weight:      Height:        Intake/Output Summary (Last 24 hours) at 01/23/2019 1104 Last data filed at 01/23/2019 0600 Gross per 24 hour  Intake 939.71 ml  Output 650 ml  Net 289.71 ml   Filed Weights   01/12/19 0929 01/21/19 1215  Weight: 67.6 kg 71.9 kg    Examination:   General: deconditioned  Neurology: Awake and alert, non focal  E ENT: mild pallor, no icterus, oral mucosa dry Cardiovascular: No JVD. S1-S2 present, rhythmic, no gallops, rubs, or murmurs. No lower extremity edema. Pulmonary: positive breath sounds bilaterally, adequate air movement, no wheezing, rhonchi or rales. Gastrointestinal. Abdomen mild distended no organomegaly, non tender, no rebound or guarding Skin. No rashes Musculoskeletal: no joint deformities     Data Reviewed: I  have personally reviewed following labs and imaging studies  CBC: Recent Labs  Lab 01/20/19 0417 01/21/19 0316 01/22/19 0327  WBC 6.2 7.0 7.3  NEUTROABS 4.0 4.6 5.3  HGB  9.8* 9.1* 9.0*  HCT 31.9* 30.5* 29.9*  MCV 84.4 85.0 85.2  PLT 292 307 678   Basic Metabolic Panel: Recent Labs  Lab 01/17/19 0436 01/18/19 0354 01/19/19 0530 01/20/19 0417 01/21/19 0316 01/22/19 0327 01/23/19 0419  NA 137 138 138 138 138 138 135  K 3.6 4.0 3.9 3.9 3.9 4.1 4.1  CL 102 106 108 106 107 105 103  CO2 26 27 27 25 24 25 25   GLUCOSE 178* 173* 147* 127* 120* 131* 107*  BUN 30* 26* 30* 38* 34* 34* 26*  CREATININE 0.61 0.60 0.55 0.64 0.56 0.61 0.64  CALCIUM 8.1* 8.1* 8.4* 8.2* 8.1* 8.1* 7.9*  MG 2.1 2.0 2.0 1.9  --  2.2 2.0  PHOS 2.3* 2.8 2.9 3.4  --   --  3.3   GFR: Estimated Creatinine Clearance: 45.5 mL/min (by C-G formula based on SCr of 0.64 mg/dL). Liver Function Tests: Recent Labs  Lab 01/17/19 0436 01/20/19 0417 01/23/19 0419  AST 19 27 24   ALT 17 28 28   ALKPHOS 41 47 51  BILITOT 0.4 0.3 0.5  PROT 5.5* 5.4* 5.1*  ALBUMIN 2.6* 2.6* 2.3*   No results for input(s): LIPASE, AMYLASE in the last 168 hours. No results for input(s): AMMONIA in the last 168 hours. Coagulation Profile: No results for input(s): INR, PROTIME in the last 168 hours. Cardiac Enzymes: No results for input(s): CKTOTAL, CKMB, CKMBINDEX, TROPONINI in the last 168 hours. BNP (last 3 results) No results for input(s): PROBNP in the last 8760 hours. HbA1C: No results for input(s): HGBA1C in the last 72 hours. CBG: Recent Labs  Lab 01/21/19 2338 01/22/19 0604 01/22/19 1230 01/22/19 2345 01/23/19 0525  GLUCAP 146* 143* 168* 124* 106*   Lipid Profile: No results for input(s): CHOL, HDL, LDLCALC, TRIG, CHOLHDL, LDLDIRECT in the last 72 hours. Thyroid Function Tests: No results for input(s): TSH, T4TOTAL, FREET4, T3FREE, THYROIDAB in the last 72 hours. Anemia Panel: No results for input(s): VITAMINB12, FOLATE, FERRITIN, TIBC, IRON, RETICCTPCT in the last 72 hours.    Radiology Studies: I have reviewed all of the imaging during this hospital visit personally     Scheduled  Meds: . feeding supplement  237 mL Oral BID BM  . heparin  5,000 Units Subcutaneous Q8H  . insulin aspart  0-15 Units Subcutaneous Q6H  . latanoprost  1 drop Both Eyes QHS  . levothyroxine  50 mcg Oral Q0600  . loratadine  10 mg Oral Daily  . pantoprazole  40 mg Oral BID  . sodium chloride flush  10-40 mL Intracatheter Q12H  . sucralfate  1 g Oral 4 times per day   Continuous Infusions: . lactated ringers 10 mL/hr at 01/21/19 0926  . TPN ADULT (ION)       LOS: 11 days        Herrick Hartog Gerome Apley, MD

## 2019-01-23 NOTE — Progress Notes (Signed)
RN explained NG tube insertion but patient refused, second RN Sophia educated about the importance of the treatment but patient wish to not proceed anymore further NG tube insertion tonight. We will continue to monitor.

## 2019-01-23 NOTE — Progress Notes (Signed)
Pt with vomiting today and worsening distention.  She did not respond to a suppository.  AXR was ordered and shows an ileus.  Will reinsert NG and make pt NPO.  Cont TPN.  Situation discussed with Son (Ed) and Daughter in law Shirlean Mylar [phone]), who is a PA.  They expressed understanding.  All questions were answered.     Rosario Adie, MD  Colorectal and [facility]

## 2019-01-23 NOTE — Progress Notes (Signed)
Nutrition Follow-up  INTERVENTION:   -Calorie Count -TPN per Pharmacy -Continue Ensure Surgery po BID, each supplement provides 330 kcal and 18 grams of protein  NUTRITION DIAGNOSIS:   Inadequate oral intake related to inability to eat as evidenced by NPO status.  Now on regular diet.  GOAL:   Patient will meet greater than or equal to 90% of their needs  Progressing.  MONITOR:   PO intake, Labs, Weight trends, I & O's  REASON FOR ASSESSMENT:   Consult Assessment of nutrition requirement/status, Calorie Count  ASSESSMENT:   85 year old female with history of HTN, hyperlipidemia, and GERD who presented initially to [facility] with complaints of generalized abdominal pain and vomiting that started after dinner. Patient reported lower abdominal pain which progressively got worse and developed N/V after that. Last bowel movement was 2 days before admission. CT abdomen showed SBO with dilated loops of small bowel. Findings concerning for closed-loop obstruction. General surgery consulted.  **RD working remotelyNational Oilwell [employer] results x 24 hours: 6/17 Dinner: 60 kcal, 1g protein (crackers, tea) 6/18 Breakfast: 100 kcal, 1g protein (1 banana, sip of milk) 6/18 Lunch: 0%, did not order as feeling sick right now.  Currently meeting 9% of kcal needs and 2% of protein needs by PO intake.  TPN at 40 ml/hr providing 988 kcal and 52g protein.  Patient states she was doing better this morning. Pt states she has Ensure Surgery at bedside now. RD encouraged pt to drink supplements. When called later to check on lunch, pt reports she feels worse and has not eaten anything else today.  Medications: IV Zofran PRN, IV Compazine PRN Labs reviewed: CBGs: 106-118  Diet Order:   Diet Order            Diet regular Room service appropriate? Yes; Fluid consistency: Thin  Diet effective now              EDUCATION NEEDS:   No education needs have been identified at this  time  Skin:  Skin Assessment: Skin Integrity Issues: Skin Integrity Issues:: Incisions Incisions: abdomen (6/9)  Last BM:  6/16  Height:   Ht Readings from Last 1 Encounters:  01/12/19 5' 6.5" (1.689 m)    Weight:   Wt Readings from Last 1 Encounters:  01/21/19 71.9 kg    Ideal Body Weight:  60.2 kg  BMI:  Body mass index is 25.21 kg/m.  Estimated Nutritional Needs:   Kcal:  1750-2025 kcal  Protein:  80-95 grams  Fluid:  >/= 1.8 L/day  Clayton Bibles, MS, RD, LDN [place] Dietitian Pager: [phone] After Hours Pager: [phone]

## 2019-01-23 NOTE — Care Management Important Message (Signed)
Important Message  Patient Details  Name: Christie Hinton MRN: [id number] Date of Birth: 10/21/1927   Medicare Important Message Given:  Yes. CMA printed out IM for Case Manager Nurse, Velva Harman to give to the patient.     [name] 01/23/2019, 9:35 AM

## 2019-01-24 ENCOUNTER — Inpatient Hospital Stay (HOSPITAL_COMMUNITY): Payer: Medicare Other

## 2019-01-24 LAB — GLUCOSE, CAPILLARY
Glucose-Capillary: 105 mg/dL — ABNORMAL HIGH (ref 70–99)
Glucose-Capillary: 76 mg/dL (ref 70–99)
Glucose-Capillary: 96 mg/dL (ref 70–99)
Glucose-Capillary: 98 mg/dL (ref 70–99)

## 2019-01-24 LAB — BASIC METABOLIC PANEL
Anion gap: 7 (ref 5–15)
BUN: 25 mg/dL — ABNORMAL HIGH (ref 8–23)
CO2: 26 mmol/L (ref 22–32)
Calcium: 7.7 mg/dL — ABNORMAL LOW (ref 8.9–10.3)
Chloride: 101 mmol/L (ref 98–111)
Creatinine, Ser: 0.64 mg/dL (ref 0.44–1.00)
GFR calc Af Amer: >60 mL/min (ref >60)
GFR calc non Af Amer: >60 mL/min (ref >60)
Glucose, Bld: 98 mg/dL (ref 70–99)
Potassium: 4 mmol/L (ref 3.5–5.1)
Sodium: 134 mmol/L — ABNORMAL LOW (ref 135–145)

## 2019-01-24 LAB — MAGNESIUM: Magnesium: 2.1 mg/dL (ref 1.7–2.4)

## 2019-01-24 MED ORDER — PANTOPRAZOLE SODIUM 40 MG IV SOLR
40.0000 mg | INTRAVENOUS | Status: DC
  Administered 2019-01-24 – 2019-01-29 (×6): 40 mg via INTRAVENOUS
  Filled 2019-01-24 (×5): qty 40

## 2019-01-24 MED ORDER — TRAVASOL 10 % IV SOLN
INTRAVENOUS | Status: AC
  Administered 2019-01-24: 18:00:00 via INTRAVENOUS
  Filled 2019-01-24: qty 528

## 2019-01-24 MED ORDER — LEVOTHYROXINE SODIUM 100 MCG/5ML IV SOLN
25.0000 ug | Freq: Every day | INTRAVENOUS | Status: DC
  Administered 2019-01-25 – 2019-01-29 (×5): 25 ug via INTRAVENOUS
  Filled 2019-01-24 (×6): qty 5

## 2019-01-24 NOTE — Progress Notes (Signed)
Physical Therapy Treatment Patient Details Name: Christie Hinton MRN: [id number] DOB: 09-02-1927 Today's Date: 01/24/2019    History of Present Illness 85 yo female admitted with SBO. S/P lysis of adhesions 6/9/. Hx of DM    PT Comments    Pt showing some improvement with safety with transfers, but needs cues with rollator and turning in tight spaces.   Follow Up Recommendations  Home health PT;Supervision - Intermittent     Equipment Recommendations  Rolling walker with 5" wheels    Recommendations for Other Services       Precautions / Restrictions Precautions Precautions: Fall    Mobility  Bed Mobility Overal bed mobility: Needs Assistance Bed Mobility: Sit to Supine     Supine to sit: Min guard     General bed mobility comments: min/guard for safety and lines  Transfers Overall transfer level: Needs assistance Equipment used: 4-wheeled walker Transfers: Sit to/from Stand Sit to Stand: Min guard;Supervision         General transfer comment: improved hand placement today  Ambulation/Gait Ambulation/Gait assistance: Min guard Gait Distance (Feet): 500 Feet Assistive device: 4-wheeled walker Gait Pattern/deviations: Step-through pattern     General Gait Details: cues with turning in tight spaces in room for increased safety   Stairs             Wheelchair Mobility    Modified Rankin (Stroke Patients Only)       Balance                                            Cognition Arousal/Alertness: Awake/alert Behavior During Therapy: WFL for tasks assessed/performed Overall Cognitive Status: Within Functional Limits for tasks assessed                                        Exercises      General Comments        Pertinent Vitals/Pain Pain Assessment: No/denies pain    Home Living                      Prior Function            PT Goals (current goals can now be found in the care plan  section) Acute Rehab PT Goals PT Goal Formulation: With patient Time For Goal Achievement: 02/07/19 Potential to Achieve Goals: Good Progress towards PT goals: Progressing toward goals    Frequency    Min 3X/week      PT Plan Current plan remains appropriate    Co-evaluation              AM-PAC PT "6 Clicks" Mobility   Outcome Measure  Help needed turning from your back to your side while in a flat bed without using bedrails?: A Little Help needed moving from lying on your back to sitting on the side of a flat bed without using bedrails?: A Little Help needed moving to and from a bed to a chair (including a wheelchair)?: A Little Help needed standing up from a chair using your arms (e.g., wheelchair or bedside chair)?: A Little Help needed to walk in hospital room?: A Little Help needed climbing 3-5 steps with a railing? : A Little 6 Click Score: 18    End of Session  Activity Tolerance: Patient tolerated treatment well Patient left: in bed;with call bell/phone within reach Nurse Communication: Mobility status PT Visit Diagnosis: Unsteadiness on feet (R26.81);Muscle weakness (generalized) (M62.81)     Time: 1125-1140 PT Time Calculation (min) (ACUTE ONLY): 15 min  Charges:  $Gait Training: 8-22 mins                     Christie Hinton, [place] Pager [phone] 01/24/2019    [name] 01/24/2019, 12:21 PM

## 2019-01-24 NOTE — Progress Notes (Signed)
10 Days Post-Op  Subjective: CC:  Unable to pass NG yesterday afternoon.  Pt refused last night.  She is passing some flatus  Objective: Vital signs in last 24 hours: Temp:  [97.6 F (36.4 C)-98.9 F (37.2 C)] 98.3 F (36.8 C) (06/19 0913) Pulse Rate:  [78-87] 78 (06/19 0913) Resp:  [17-18] 17 (06/19 0913) BP: (114-137)/(67-76) 137/71 (06/19 0913) SpO2:  [94 %-96 %] 96 % (06/19 0913) Last BM Date: (6/18  a little bit output after suppository this am)  Intake/Output from previous day: 06/18 0701 - 06/19 0700 In: 6073 [P.O.:270; I.V.:1227] Out: 950 [Urine:850; Emesis/NG output:100] Intake/Output this shift: No intake/output data recorded.  PE: Gen: Awake and alert, NAD Abd: Soft, moderate distension, tenderness appropriately around her midline incision. Midline incision with staples intact. No wound separation. Some mild erythema surrounding staples that favors irritation/reactive inflammation rather and is stable Msk: no edema  Lab Results:  Recent Labs    01/22/19 0327  WBC 7.3  HGB 9.0*  HCT 29.9*  PLT 280   BMET Recent Labs    01/23/19 0419 01/24/19 0421  NA 135 134*  K 4.1 4.0  CL 103 101  CO2 25 26  GLUCOSE 107* 98  BUN 26* 25*  CREATININE 0.64 0.64  CALCIUM 7.9* 7.7*   PT/INR No results for input(s): LABPROT, INR in the last 72 hours. CMP     Component Value Date/Time   NA 134 (L) 01/24/2019 0421   K 4.0 01/24/2019 0421   CL 101 01/24/2019 0421   CO2 26 01/24/2019 0421   GLUCOSE 98 01/24/2019 0421   BUN 25 (H) 01/24/2019 0421   CREATININE 0.64 01/24/2019 0421   CALCIUM 7.7 (L) 01/24/2019 0421   PROT 5.1 (L) 01/23/2019 0419   ALBUMIN 2.3 (L) 01/23/2019 0419   AST 24 01/23/2019 0419   ALT 28 01/23/2019 0419   ALKPHOS 51 01/23/2019 0419   BILITOT 0.5 01/23/2019 0419   GFRNONAA >60 01/24/2019 0421   GFRAA >60 01/24/2019 0421   Lipase     Component Value Date/Time   LIPASE 29 01/12/2019 0928       Studies/Results: Dg Abd  Portable 1v  Result Date: 01/24/2019 CLINICAL DATA:  Vomiting.  Abdominal pain. EXAM: PORTABLE ABDOMEN - 1 VIEW COMPARISON:  01/23/2019.  01/19/2019. FINDINGS: Surgical staples noted over the abdomen. Persistent prominently dilated small bowel loops are again noted. Small-bowel distention may have increased from prior exam. Paucity of intra colonic air. No free air. Degenerative changes lumbar spine right hip. Total left hip replacement. IMPRESSION: Persistent prominent dilated small bowel loops are again noted. Small-bowel distention may have progressed from prior exam. Findings consistent small bowel obstruction. Electronically Signed   By: Marcello Moores  Register   On: 01/24/2019 07:16   Dg Abd Portable 1v  Result Date: 01/23/2019 CLINICAL DATA:  Vomiting, ileus postop EXAM: PORTABLE ABDOMEN - 1 VIEW COMPARISON:  01/19/2019 FINDINGS: Marked gaseous distention of predominantly small bowel loops. Gas noted within nondistended stomach and large bowel. Appearance is concerning for small bowel obstruction. No visible free air organomegaly. IMPRESSION: Continued marked dilatation of small bowel loops concerning for persistent small bowel obstruction. Electronically Signed   By: Rolm Baptise M.D.   On: 01/23/2019 19:50   Dg Abd Portable 1v  Result Date: 01/23/2019 CLINICAL DATA:  NG placement EXAM: PORTABLE ABDOMEN - 1 VIEW COMPARISON:  01/23/2019 FINDINGS: NG tip within hiatal hernia.  NG above the diaphragm. Dilated small bowel loops with mild improvement since earlier  today. Skin staples in the midline of the abdomen. IMPRESSION: NG tip coiled in hiatal hernia.  Decrease small bowel dilatation. Electronically Signed   By: Marlan Palauharles  Clark M.D.   On: 01/23/2019 18:36    Anti-infectives: Anti-infectives (From admission, onward)   Start     Dose/Rate Route Frequency Ordered Stop   01/14/19 1103  sodium chloride 0.9 % with cefoTEtan (CEFOTAN) ADS Med    Note to Pharmacy: Vevelyn RoyalsHarvell, Gwendolyn  : cabinet override       01/14/19 1103 01/14/19 1124   01/14/19 1045  cefoTEtan (CEFOTAN) 2 g in sodium chloride 0.9 % 100 mL IVPB     2 g 200 mL/hr over 30 Minutes Intravenous  Once 01/14/19 1044 01/14/19 1124       Assessment/Plan Thyroid disease HTN HLD DM  - above per medicine team Protein Calorie Malnutrition - Pre-albumin 8.4 (6/10)>>14.1(6/15)  SBO S/p Ex Lap with LOA for SBO due to Omental Adhesion, internal hernia - Dr. Derrell Lollingamirez - 01/14/2019 - POD #10 - suppository for constipation - Mobilize for bowel function - Pulm toliet - Keep K>4 and Mg >2 for bowel function  FEN - TPN, reg diet as tolerated VTE - Heparin, SCDs, Mobilize  ID - Cefotetan periop. None currently. Afebrile. WBC 6,200 Foley - None  Follow up - Dr. Derrell Lollingamirez  POC - Son - Rockie Neighboursd Bly [phone])  - Per note from Dr. Ezzard StandingNewman on 6/14, patient's son and daughter plan to be with her when she returns home   Plan: Will try again to place NG on the floor.  May need fluoro assistance  D/c skin staples on day of discharge   LOS: 12 days     Vanita PandaAlicia C Zanayah Shadowens, MD  Colorectal and General Surgery [facility]

## 2019-01-24 NOTE — Progress Notes (Signed)
PHARMACY - ADULT TOTAL PARENTERAL NUTRITION CONSULT NOTE   Pharmacy Consult for TPN Indication: Post-operative ileus following surgery for SBO  Patient Measurements: Height: 5' 6.5" (168.9 cm) Weight: 158 lb 9 oz (71.9 kg) IBW/kg (Calculated) : 60.45 TPN AdjBW (KG): 67.6 Body mass index is 25.21 kg/m.   Vitals:   01/24/19 0553 01/24/19 0913  BP: 129/76 137/71  Pulse: 79 78  Resp: 17 17  Temp: 97.6 F (36.4 C) 98.3 F (36.8 C)  SpO2: 96% 96%    Intake/Output Summary (Last 24 hours) at 01/24/2019 1033 Last data filed at 01/24/2019 1013 Gross per 24 hour  Intake 1097 ml  Output 650 ml  Net 447 ml    Recent Labs    01/23/19 0419 01/24/19 0421  NA 135 134*  K 4.1 4.0  CL 103 101  CO2 25 26  GLUCOSE 107* 98  BUN 26* 25*  CREATININE 0.64 0.64  CALCIUM 7.9* 7.7*  PHOS 3.3  --   MG 2.0 2.1  ALBUMIN 2.3*  --   ALKPHOS 51  --   AST 24  --   ALT 28  --   BILITOT 0.5  --   Corr Ca 6/17 = 9.2  Insulin Requirements: 2 units SSI last 24hr with 20 units insulin added to TPN bag - Hx of DM (diet controlled) noted.  Current Nutrition: NPO, TPN  IVF: none  Central access: PICC  TPN start date: 6/10  ASSESSMENT                                                                                        HPI: 85 y/o F with SBO underwent laparotomy and lysis of adhesions on 6.9.20, now on TPN.  Hx of DM (diet-controlled) noted.  Significant events:  6/14: continue TPN, start CL soon and per CCS note may start to wean TPN soon if tolerated 6/16: started clear liquids 6/17: TPN reduced to 1/2 rate per surgery 6/18: +Ileus, attempting to insert NGT but pt refusing.    Today:   Glucose (goal CBG<150) -  Insulin in TPN 20 units - CBGs at goal but trending to lower end (76-137)  Electrolytes - at goal except Na slightly low at 134 ( Goal Mag >2 , K>4)  Renal - stable  LFTs - all < ULN  TGs - 109 (6/15)  Prealbumin - increased from 8.4 (6/10) to 14.1 (6/15)  24h calorie  count in progress  NUTRITIONAL GOALS                                                                           KCal:    [id number] / day Protein: 80-95 g /day  Goal TPN rate is 75 ml/hr (provides 1854 KCal/day, 99 g protein/day, 1.8 L/day)  PLAN  Continue custom TPN at reduced rate of 40 ml/hr.  Decrease 10 units insulin in TPN  Standard electrolytes in TPN except increase Na to 90 and K to 55. TPN to contain standard multivitamins daily and trace elements MWF. Continue SSI Novolog moderate scale q6h IVF per MD- currently none TPN lab panels on Mondays & Thursdays.  Bmet in am.  Follow tolerance of PO diet and potential future plans for transitioning off TPN  Thank you for allowing pharmacy to be a part of this patient's care.  Netta Cedars, PharmD, BCPS 01/24/2019  10:33 AM

## 2019-01-24 NOTE — Progress Notes (Signed)
PROGRESS NOTE    Christie ChampionMarian Hinton  RUE:[id number] DOB: 1928-05-08 DOA: 01/12/2019 PCP: Sharren BridgeSellers, Billie H, NP    Brief Narrative:  85 year old female who presented abdominal pain, nausea or vomiting. She does have significant past medical history for hypertension, dyslipidemia and GERD. She reported generalized abdominal pain after a meal, associated with vomiting. She presented to the hospital due to progressive abdominal pain. On her initial physical examination her temperature was 97.7, heart rate 76, blood pressure 166/82, oxygen saturation 99%. Her lungs are clear to auscultation bilaterally, heart S1-S2 present rhythmic, the abdomen was soft, generalized abdominal pain to palpation, hypoactive bowel sounds, no lower extremity edema. CT of the abdomen with a small bowel obstruction, dilated loops of small bowel in the left abdomen with mesenteric edema and a well-defined transition point.Findings concerning for closed-loop obstruction, possibly related to an internal hernia.  Patient was admitted to the hospital working diagnosis of small bowel obstruction.    Assessment & Plan:   Principal Problem:   SBO (small bowel obstruction) s/p lysis of adhesions 01/14/2019 Active Problems:   Essential hypertension   Mixed hyperlipidemia   1. Small bowel obstruction sp exploratory laparotomy 01/14/19, with post operative ileous.Patient with worsening abdominal distention yesterday, developed an ileum, today had NG tube placed since she declined this intervention yesterday, will continue supportive medical therapy with IV fluids and as needed antiemetics and analgesics (IV fentanyl-morphine). Continue antiacid therapy IV. NG tube to low intermittent suction. Started on TPN per surgical team.   2. Hypokalemia, hypophosphatemia. K at 4,0 with serum bicarbonate at 26, will continue to follow on renal panel in am, avoid hypotension and nephrotoxic medications.  3. HTN.Blood pressure  monitoring, now off medications.    4. Hypothyroid.Will change levothyroxine back to IV for now.   5. Dyslipidemia.Holding statin while NPO.   6. T2DM.Today;s fasting glucose was 98, will continue close monitoring, will dc insulin for now to prevent hypoglycemia.   DVT prophylaxis:enoxaparin Code Status:full Family Communication:no family at the bedside Disposition Plan/ discharge barriers:pending clinical improvement    Body mass index is 25.21 kg/m. Malnutrition Type:  Nutrition Problem: Inadequate oral intake Etiology: inability to eat   Malnutrition Characteristics:  Signs/Symptoms: NPO status   Nutrition Interventions:  Interventions: TPN, Ensure Surgery  RN Pressure Injury Documentation:     Consultants:   Surgery   Procedures:   Exploratory laparotomy with colostomy   Antimicrobials:       Subjective: Patient with persistent abdominal pain, no further nausea or vomiting, has NG tube in place, connected to suction. No chest pain or dyspnea.   Objective: Vitals:   01/23/19 1354 01/23/19 2141 01/24/19 0553 01/24/19 0913  BP: 114/67 132/71 129/76 137/71  Pulse: 84 87 79 78  Resp: 18 17 17 17   Temp: 98.9 F (37.2 C) 98.2 F (36.8 C) 97.6 F (36.4 C) 98.3 F (36.8 C)  TempSrc: Oral Oral Oral Oral  SpO2: 94% 96% 96% 96%  Weight:      Height:        Intake/Output Summary (Last 24 hours) at 01/24/2019 1240 Last data filed at 01/24/2019 1013 Gross per 24 hour  Intake 1097 ml  Output 400 ml  Net 697 ml   Filed Weights   01/12/19 0929 01/21/19 1215  Weight: 67.6 kg 71.9 kg    Examination:   General: deconditioned and ill looking appearing.  Neurology: Awake and alert, non focal  E ENT: positive pallor, no icterus, oral mucosa moist Cardiovascular: No JVD. S1-S2 present, rhythmic,  no gallops, rubs, or positive right sternal border systolic murmur. No lower extremity edema. Pulmonary: positive breath sounds bilaterally,  adequate air movement, no wheezing, rhonchi or rales. Gastrointestinal. Abdomen mild distended, tender to palpation, no organomegaly, non tender, no rebound or guarding Skin. No rashes Musculoskeletal: no joint deformities     Data Reviewed: I have personally reviewed following labs and imaging studies  CBC: Recent Labs  Lab 01/20/19 0417 01/21/19 0316 01/22/19 0327  WBC 6.2 7.0 7.3  NEUTROABS 4.0 4.6 5.3  HGB 9.8* 9.1* 9.0*  HCT 31.9* 30.5* 29.9*  MCV 84.4 85.0 85.2  PLT 292 307 563   Basic Metabolic Panel: Recent Labs  Lab 01/18/19 0354 01/19/19 0530 01/20/19 0417 01/21/19 0316 01/22/19 0327 01/23/19 0419 01/24/19 0421  NA 138 138 138 138 138 135 134*  K 4.0 3.9 3.9 3.9 4.1 4.1 4.0  CL 106 108 106 107 105 103 101  CO2 27 27 25 24 25 25 26   GLUCOSE 173* 147* 127* 120* 131* 107* 98  BUN 26* 30* 38* 34* 34* 26* 25*  CREATININE 0.60 0.55 0.64 0.56 0.61 0.64 0.64  CALCIUM 8.1* 8.4* 8.2* 8.1* 8.1* 7.9* 7.7*  MG 2.0 2.0 1.9  --  2.2 2.0 2.1  PHOS 2.8 2.9 3.4  --   --  3.3  --    GFR: Estimated Creatinine Clearance: 45.5 mL/min (by C-G formula based on SCr of 0.64 mg/dL). Liver Function Tests: Recent Labs  Lab 01/20/19 0417 01/23/19 0419  AST 27 24  ALT 28 28  ALKPHOS 47 51  BILITOT 0.3 0.5  PROT 5.4* 5.1*  ALBUMIN 2.6* 2.3*   No results for input(s): LIPASE, AMYLASE in the last 168 hours. No results for input(s): AMMONIA in the last 168 hours. Coagulation Profile: No results for input(s): INR, PROTIME in the last 168 hours. Cardiac Enzymes: No results for input(s): CKTOTAL, CKMB, CKMBINDEX, TROPONINI in the last 168 hours. BNP (last 3 results) No results for input(s): PROBNP in the last 8760 hours. HbA1C: No results for input(s): HGBA1C in the last 72 hours. CBG: Recent Labs  Lab 01/23/19 1142 01/23/19 1738 01/24/19 0011 01/24/19 0557 01/24/19 1127  GLUCAP 118* 137* 105* 76 96   Lipid Profile: No results for input(s): CHOL, HDL, LDLCALC, TRIG,  CHOLHDL, LDLDIRECT in the last 72 hours. Thyroid Function Tests: No results for input(s): TSH, T4TOTAL, FREET4, T3FREE, THYROIDAB in the last 72 hours. Anemia Panel: No results for input(s): VITAMINB12, FOLATE, FERRITIN, TIBC, IRON, RETICCTPCT in the last 72 hours.    Radiology Studies: I have reviewed all of the imaging during this hospital visit personally     Scheduled Meds: . heparin  5,000 Units Subcutaneous Q8H  . insulin aspart  0-15 Units Subcutaneous Q6H  . latanoprost  1 drop Both Eyes QHS  . levothyroxine  50 mcg Oral Q0600  . loratadine  10 mg Oral Daily  . pantoprazole (PROTONIX) IV  40 mg Intravenous Q24H  . sodium chloride flush  10-40 mL Intracatheter Q12H  . sucralfate  1 g Oral 4 times per day   Continuous Infusions: . lactated ringers 10 mL/hr at 01/23/19 2203  . TPN ADULT (ION) 40 mL/hr at 01/23/19 2203  . TPN ADULT (ION)       LOS: 12 days        Lizmary Nader Gerome Apley, MD

## 2019-01-25 ENCOUNTER — Inpatient Hospital Stay (HOSPITAL_COMMUNITY): Payer: Medicare Other

## 2019-01-25 LAB — CBC WITH DIFFERENTIAL/PLATELET
Abs Immature Granulocytes: 0.15 10*3/uL — ABNORMAL HIGH (ref 0.00–0.07)
Basophils Absolute: 0 10*3/uL (ref 0.0–0.1)
Basophils Relative: 0 %
Eosinophils Absolute: 0.1 10*3/uL (ref 0.0–0.5)
Eosinophils Relative: 1 %
HCT: 31.2 % — ABNORMAL LOW (ref 36.0–46.0)
Hemoglobin: 9.5 g/dL — ABNORMAL LOW (ref 12.0–15.0)
Immature Granulocytes: 2 %
Lymphocytes Relative: 12 %
Lymphs Abs: 0.9 10*3/uL (ref 0.7–4.0)
MCH: 25.7 pg — ABNORMAL LOW (ref 26.0–34.0)
MCHC: 30.4 g/dL (ref 30.0–36.0)
MCV: 84.3 fL (ref 80.0–100.0)
Monocytes Absolute: 0.8 10*3/uL (ref 0.1–1.0)
Monocytes Relative: 10 %
Neutro Abs: 5.6 10*3/uL (ref 1.7–7.7)
Neutrophils Relative %: 75 %
Platelets: 316 10*3/uL (ref 150–400)
RBC: 3.7 MIL/uL — ABNORMAL LOW (ref 3.87–5.11)
RDW: 16.3 % — ABNORMAL HIGH (ref 11.5–15.5)
WBC: 7.6 10*3/uL (ref 4.0–10.5)
nRBC: 0 % (ref 0.0–0.2)

## 2019-01-25 LAB — BASIC METABOLIC PANEL
Anion gap: 9 (ref 5–15)
BUN: 19 mg/dL (ref 8–23)
CO2: 25 mmol/L (ref 22–32)
Calcium: 8.1 mg/dL — ABNORMAL LOW (ref 8.9–10.3)
Chloride: 101 mmol/L (ref 98–111)
Creatinine, Ser: 0.64 mg/dL (ref 0.44–1.00)
GFR calc Af Amer: >60 mL/min (ref >60)
GFR calc non Af Amer: >60 mL/min (ref >60)
Glucose, Bld: 115 mg/dL — ABNORMAL HIGH (ref 70–99)
Potassium: 3.8 mmol/L (ref 3.5–5.1)
Sodium: 135 mmol/L (ref 135–145)

## 2019-01-25 LAB — GLUCOSE, CAPILLARY
Glucose-Capillary: 110 mg/dL — ABNORMAL HIGH (ref 70–99)
Glucose-Capillary: 115 mg/dL — ABNORMAL HIGH (ref 70–99)
Glucose-Capillary: 120 mg/dL — ABNORMAL HIGH (ref 70–99)
Glucose-Capillary: 122 mg/dL — ABNORMAL HIGH (ref 70–99)
Glucose-Capillary: 140 mg/dL — ABNORMAL HIGH (ref 70–99)

## 2019-01-25 MED ORDER — INSULIN ASPART 100 UNIT/ML ~~LOC~~ SOLN
0.0000 [IU] | Freq: Four times a day (QID) | SUBCUTANEOUS | Status: DC
  Administered 2019-01-25 – 2019-01-27 (×5): 1 [IU] via SUBCUTANEOUS
  Administered 2019-01-27 – 2019-01-28 (×3): 2 [IU] via SUBCUTANEOUS
  Administered 2019-01-28: 1 [IU] via SUBCUTANEOUS

## 2019-01-25 MED ORDER — INSULIN ASPART 100 UNIT/ML ~~LOC~~ SOLN: 0.0000 [IU] | Freq: Four times a day (QID) | SUBCUTANEOUS | Status: DC

## 2019-01-25 MED ORDER — TRAVASOL 10 % IV SOLN
INTRAVENOUS | Status: AC
  Administered 2019-01-25: 19:00:00 via INTRAVENOUS
  Filled 2019-01-25: qty 990

## 2019-01-25 MED ORDER — POTASSIUM CHLORIDE 10 MEQ/50ML IV SOLN
10.0000 meq | INTRAVENOUS | Status: AC
  Administered 2019-01-25 (×2): 10 meq via INTRAVENOUS
  Filled 2019-01-25 (×2): qty 50

## 2019-01-25 NOTE — Progress Notes (Signed)
11 Days Post-Op  Subjective: CC:  Placed NG in fluoro yesterday  Objective: Vital signs in last 24 hours: Temp:  [97.3 F (36.3 C)-98.7 F (37.1 C)] 97.3 F (36.3 C) (06/20 0858) Pulse Rate:  [73-82] 81 (06/20 0858) Resp:  [17-20] 18 (06/20 0858) BP: (105-132)/(65-72) 132/71 (06/20 0858) SpO2:  [94 %-97 %] 97 % (06/20 0858) Last BM Date: 01/23/19  Intake/Output from previous day: 06/19 0701 - 06/20 0700 In: 1158.8 [I.V.:1158.8] Out: 330 [Emesis/NG output:330] Intake/Output this shift: No intake/output data recorded.  PE: Gen: Awake and alert, NAD Abd: Soft, less distension, tenderness appropriately around her midline incision. Midline incision with staples intact. No wound separation. Some mild erythema surrounding staples that favors irritation/reactive inflammation rather and is stable Msk: no edema  Lab Results:  Recent Labs    01/25/19 0424  WBC 7.6  HGB 9.5*  HCT 31.2*  PLT 316   BMET Recent Labs    01/24/19 0421 01/25/19 0424  NA 134* 135  K 4.0 3.8  CL 101 101  CO2 26 25  GLUCOSE 98 115*  BUN 25* 19  CREATININE 0.64 0.64  CALCIUM 7.7* 8.1*   PT/INR No results for input(s): LABPROT, INR in the last 72 hours. CMP     Component Value Date/Time   NA 135 01/25/2019 0424   K 3.8 01/25/2019 0424   CL 101 01/25/2019 0424   CO2 25 01/25/2019 0424   GLUCOSE 115 (H) 01/25/2019 0424   BUN 19 01/25/2019 0424   CREATININE 0.64 01/25/2019 0424   CALCIUM 8.1 (L) 01/25/2019 0424   PROT 5.1 (L) 01/23/2019 0419   ALBUMIN 2.3 (L) 01/23/2019 0419   AST 24 01/23/2019 0419   ALT 28 01/23/2019 0419   ALKPHOS 51 01/23/2019 0419   BILITOT 0.5 01/23/2019 0419   GFRNONAA >60 01/25/2019 0424   GFRAA >60 01/25/2019 0424   Lipase     Component Value Date/Time   LIPASE 29 01/12/2019 0928       Studies/Results: Dg Abd Portable 1v  Result Date: 01/25/2019 CLINICAL DATA:  Ileus EXAM: PORTABLE ABDOMEN - 1 VIEW COMPARISON:  01/24/2019 abdominal radiograph  FINDINGS: Enteric tube terminates at the esophagogastric junction. Vertical skin staples overlie the medial left abdomen. Partially visualized left total hip arthroplasty. Prominent diffuse small bowel dilatation up to the 6.1 cm diameter, minimally improved. No evidence of pneumatosis or pneumoperitoneum. Minimal colonic stool. No radiopaque nephrolithiasis. IMPRESSION: 1. Enteric tube terminates at the esophagogastric junction. 2. Prominent diffuse small bowel dilatation, minimally improved, compatible with severe postoperative adynamic ileus. Electronically Signed   By: Delbert PhenixJason A Poff M.D.   On: 01/25/2019 07:09   Dg Abd Portable 1v  Result Date: 01/24/2019 CLINICAL DATA:  Vomiting.  Abdominal pain. EXAM: PORTABLE ABDOMEN - 1 VIEW COMPARISON:  01/23/2019.  01/19/2019. FINDINGS: Surgical staples noted over the abdomen. Persistent prominently dilated small bowel loops are again noted. Small-bowel distention may have increased from prior exam. Paucity of intra colonic air. No free air. Degenerative changes lumbar spine right hip. Total left hip replacement. IMPRESSION: Persistent prominent dilated small bowel loops are again noted. Small-bowel distention may have progressed from prior exam. Findings consistent small bowel obstruction. Electronically Signed   By: Maisie Fushomas  Register   On: 01/24/2019 07:16   Dg Abd Portable 1v  Result Date: 01/23/2019 CLINICAL DATA:  Vomiting, ileus postop EXAM: PORTABLE ABDOMEN - 1 VIEW COMPARISON:  01/19/2019 FINDINGS: Marked gaseous distention of predominantly small bowel loops. Gas noted within nondistended stomach and large  bowel. Appearance is concerning for small bowel obstruction. No visible free air organomegaly. IMPRESSION: Continued marked dilatation of small bowel loops concerning for persistent small bowel obstruction. Electronically Signed   By: Rolm Baptise M.D.   On: 01/23/2019 19:50   Dg Abd Portable 1v  Result Date: 01/23/2019 CLINICAL DATA:  NG placement  EXAM: PORTABLE ABDOMEN - 1 VIEW COMPARISON:  01/23/2019 FINDINGS: NG tip within hiatal hernia.  NG above the diaphragm. Dilated small bowel loops with mild improvement since earlier today. Skin staples in the midline of the abdomen. IMPRESSION: NG tip coiled in hiatal hernia.  Decrease small bowel dilatation. Electronically Signed   By: Franchot Gallo M.D.   On: 01/23/2019 18:36   Dg Addison Bailey G Tube Plc W/fl W/rad  Result Date: 01/24/2019 CLINICAL DATA:  Small-bowel distention.  NG tube for decompression. EXAM: NASO G TUBE PLACEMENT WITH FL AND WITH RAD CONTRAST:  None. FLUOROSCOPY TIME:  Fluoroscopy Time:  0 minutes and 48 seconds. Radiation Exposure Index (if provided by the fluoroscopic device): 16.7 mGy Number of Acquired Spot Images: 0 COMPARISON:  Abdomen film from 01/24/2020. Prior NG tube placement 01/15/2019. FINDINGS: NG tube was placed via the patient's right nostril and easily directed into the patient's known moderate to large hiatal hernia. Multiple attempts were made to direct the NG tube through the hernia into the subdiaphragmatic portion of the stomach, but these were unsuccessful. IMPRESSION: Successful NG tube placement into the supradiaphragmatic portion of the stomach in this patient with moderate to large hiatal hernia. As before, the tube could not be directed into the infra diaphragmatic portion of the stomach due to orientation of the hiatal hernia. I discussed this study with the patient's nurse Beth, at 1451 hours on 01/24/2019. Electronically Signed   By: Misty Stanley M.D.   On: 01/24/2019 14:46    Anti-infectives: Anti-infectives (From admission, onward)   Start     Dose/Rate Route Frequency Ordered Stop   01/14/19 1103  sodium chloride 0.9 % with cefoTEtan (CEFOTAN) ADS Med    Note to Pharmacy: [name]  : cabinet override      01/14/19 1103 01/14/19 1124   01/14/19 1045  cefoTEtan (CEFOTAN) 2 g in sodium chloride 0.9 % 100 mL IVPB     2 g 200 mL/hr over 30  Minutes Intravenous  Once 01/14/19 1044 01/14/19 1124       Assessment/Plan Thyroid disease HTN HLD DM  - above per medicine team Protein Calorie Malnutrition - Pre-albumin 8.4 (6/10)>>14.1(6/15)  SBO S/p Ex Lap with LOA for SBO due to Omental Adhesion, internal hernia - Dr. Rosendo Gros - 01/14/2019 - POD #11 - suppository for constipation - Mobilize for bowel function - Pulm toliet - Keep K>4 and Mg >2 for bowel function  FEN - TPN, NPO VTE - Heparin, SCDs, Mobilize  ID - Cefotetan periop. None currently. Afebrile. WBC 6,200 Foley - None  Follow up - Dr. Rosendo Gros  POC - Son - Tyrone Schimke [phone])  - Per note from Dr. Lucia Gaskins on 6/14, patient's son and daughter plan to be with her when she returns home   Plan: NG in place in hiatal hernia.  AXR shows mild improvement and SB dilation c/w ileus.  Cont NG drainage for now      LOS: 13 days     Rosario Adie, MD  [facility]

## 2019-01-25 NOTE — Progress Notes (Signed)
PHARMACY - ADULT TOTAL PARENTERAL NUTRITION CONSULT NOTE   Pharmacy Consult for TPN Indication: Post-operative ileus following surgery for SBO  Patient Measurements: Height: 5' 6.5" (168.9 cm) Weight: 158 lb 9 oz (71.9 kg) IBW/kg (Calculated) : 60.45 TPN AdjBW (KG): 67.6 Body mass index is 25.21 kg/m.   Vitals:   01/25/19 0419 01/25/19 0858  BP: 124/72 132/71  Pulse: 82 81  Resp: 20 18  Temp: 98.1 F (36.7 C) (!) 97.3 F (36.3 C)  SpO2: 96% 97%    Intake/Output Summary (Last 24 hours) at 01/25/2019 0916 Last data filed at 01/25/2019 0600 Gross per 24 hour  Intake 1158.76 ml  Output 330 ml  Net 828.76 ml    Recent Labs    01/23/19 0419 01/24/19 0421 01/25/19 0424  NA 135 134* 135  K 4.1 4.0 3.8  CL 103 101 101  CO2 25 26 25   GLUCOSE 107* 98 115*  BUN 26* 25* 19  CREATININE 0.64 0.64 0.64  CALCIUM 7.9* 7.7* 8.1*  PHOS 3.3  --   --   MG 2.0 2.1  --   ALBUMIN 2.3*  --   --   ALKPHOS 51  --   --   AST 24  --   --   ALT 28  --   --   BILITOT 0.5  --   --   Corr Ca 6/17 = 9.2  Insulin Requirements: 0 units SSI last 24hr with 10 units insulin added to TPN bag - Hx of DM (diet controlled) noted.  Current Nutrition: NPO, TPN  IVF: LR @ 35ml/hr  Central access: PICC  TPN start date: 6/10  ASSESSMENT                                                                                        HPI: 85 y/o F with SBO underwent laparotomy and lysis of adhesions on 6.9.20, now on TPN.  Hx of DM (diet-controlled) noted.  Significant events:  6/14: continue TPN, start CL soon and per CCS note may start to wean TPN soon if tolerated 6/16: started clear liquids 6/17: TPN reduced to 1/2 rate per surgery 6/18: +Ileus, attempting to insert NGT but pt refusing.   6/20: Increase TPN back to full dose per surgery  Today:   Glucose (goal CBG<150) -  Insulin in TPN 10 units - CBGs at goal but trending to lower end (96-115)  Electrolytes - at goal except K slightly low at 3.8 (  Goal Mag >2 , K>4)  Renal - stable  LFTs - all < ULN  TGs - 109 (6/15)  Prealbumin - increased from 8.4 (6/10) to 14.1 (6/15)  NUTRITIONAL GOALS                                                                           KCal:    7169-6789 / day Protein:  80-95 g /day  Goal TPN rate is 75 ml/hr (provides 1854 KCal/day, 99 g protein/day, 1.8 L/day)  PLAN                                                                                                        Now:  IV Krun 10mEq x2 today  Increase custom TPN to goal rate of 75 ml/hr.  10 units insulin in TPN  Standard electrolytes in TPN except increase Na to 90 and K to 55. TPN to contain standard multivitamins daily and trace elements MWF. Resume SSI Novolog sensitive scale q6h at 6pm with new TPN IVF per MD TPN lab panels on Mondays & Thursdays.  Bmet in am.   Thank you for allowing pharmacy to be a part of this patient's care.  Junita PushMichelle Yazmina Pareja, PharmD, BCPS 01/25/2019  9:16 AM

## 2019-01-25 NOTE — Progress Notes (Signed)
PROGRESS NOTE    Christie Hinton  YIF:[id number] DOB: 1927/08/30 DOA: 01/12/2019 PCP: Belia Heman, NP    Brief Narrative:  85 year old female who presented abdominal pain, nausea or vomiting. She does have significant past medical history for hypertension, dyslipidemia and GERD. She reported generalized abdominal pain after a meal, associated with vomiting. She presented to the hospital due to progressive abdominal pain. On her initial physical examination her temperature was 97.7, heart rate 76, blood pressure 166/82, oxygen saturation 99%. Her lungs are clear to auscultation bilaterally, heart S1-S2 present rhythmic, the abdomen was soft, generalized abdominal pain to palpation, hypoactive bowel sounds, no lower extremity edema. CT of the abdomen with a small bowel obstruction, dilated loops of small bowel in the left abdomen with mesenteric edema and a well-defined transition point.Findings concerning for closed-loop obstruction, possibly related to an internal hernia.  Patient was admitted to the hospital working diagnosis of small bowel obstruction.  Underwent exploratory laparotomy on 01/14/19, lysis of adhesions.  Complicated with post operative ileus.   Assessment & Plan:   Principal Problem:   SBO (small bowel obstruction) s/p lysis of adhesions 01/14/2019 Active Problems:   Essential hypertension   Mixed hyperlipidemia   1. Small bowel obstruction sp exploratory laparotomy 01/14/19, with post operative ileous.NG tube in place with improvement of her symptoms and abdominal distention, abd films with persistent small bowel dilatation (personally reviewed). NG tube output  330 ml, continue low intermittent suction. TPN for nutrition. Continue supportive medical therapy with as needed antiemetics and analgesics.   2. Hypokalemia, hypophosphatemia. Stable electrolytes, with K at 3,8, Na 135 and serum bicarbonate at 25. Continue TPN per pharmacy protocol.   3. HTN.Continue  blood pressure monitoring off medications.   4. Hypothyroid.Continue with levothyroxine back to IV  5. Dyslipidemia.On hold due to patient npo.  6. T2DM.Fasting glucose is 115,continue insulin therapy per pharmacy protocol.   DVT prophylaxis:enoxaparin Code Status:full Family Communication:no family at the bedside Disposition Plan/ discharge barriers:pending clinical improvement  Body mass index is 25.21 kg/m. Malnutrition Type:  Nutrition Problem: Inadequate oral intake Etiology: inability to eat   Malnutrition Characteristics:  Signs/Symptoms: NPO status   Nutrition Interventions:  Interventions: TPN, Ensure Surgery  RN Pressure Injury Documentation:     Consultants:   Surgery   Procedures:   Exploratory laparotomy   Antimicrobials:       Subjective: Patient feeling better with NG tube in place, but continue to have abdominal pain and distention, not yet back to her baseline, no nausea or further vomiting, no chest pain or dyspnea.   Objective: Vitals:   01/24/19 1337 01/24/19 2156 01/25/19 0419 01/25/19 0858  BP: 105/65 114/66 124/72 132/71  Pulse: 74 73 82 81  Resp: 17 20 20 18   Temp: 98 F (36.7 C) 98.7 F (37.1 C) 98.1 F (36.7 C) (!) 97.3 F (36.3 C)  TempSrc: Oral Oral Oral Oral  SpO2: 94% 96% 96% 97%  Weight:      Height:        Intake/Output Summary (Last 24 hours) at 01/25/2019 0934 Last data filed at 01/25/2019 0600 Gross per 24 hour  Intake 1158.76 ml  Output 330 ml  Net 828.76 ml   Filed Weights   01/12/19 0929 01/21/19 1215  Weight: 67.6 kg 71.9 kg    Examination:   General: deconditioned  Neurology: Awake and alert, non focal  E ENT: mild pallor, no icterus, oral mucosa moist. NG tube in place.  Cardiovascular: No JVD. S1-S2 present, rhythmic, no  gallops, rubs, or murmurs. Trace lower extremity edema. Pulmonary: positive breath sounds bilaterally, adequate air movement, no wheezing, rhonchi or rales.  Gastrointestinal. Abdomen with mild distention with no organomegaly, non tender to superficial palpation, no rebound or guarding Skin. No rashes Musculoskeletal: no joint deformities     Data Reviewed: I have personally reviewed following labs and imaging studies  CBC: Recent Labs  Lab 01/20/19 0417 01/21/19 0316 01/22/19 0327 01/25/19 0424  WBC 6.2 7.0 7.3 7.6  NEUTROABS 4.0 4.6 5.3 5.6  HGB 9.8* 9.1* 9.0* 9.5*  HCT 31.9* 30.5* 29.9* 31.2*  MCV 84.4 85.0 85.2 84.3  PLT 292 307 280 316   Basic Metabolic Panel: Recent Labs  Lab 01/19/19 0530 01/20/19 0417 01/21/19 0316 01/22/19 0327 01/23/19 0419 01/24/19 0421 01/25/19 0424  NA 138 138 138 138 135 134* 135  K 3.9 3.9 3.9 4.1 4.1 4.0 3.8  CL 108 106 107 105 103 101 101  CO2 27 25 24 25 25 26 25   GLUCOSE 147* 127* 120* 131* 107* 98 115*  BUN 30* 38* 34* 34* 26* 25* 19  CREATININE 0.55 0.64 0.56 0.61 0.64 0.64 0.64  CALCIUM 8.4* 8.2* 8.1* 8.1* 7.9* 7.7* 8.1*  MG 2.0 1.9  --  2.2 2.0 2.1  --   PHOS 2.9 3.4  --   --  3.3  --   --    GFR: Estimated Creatinine Clearance: 45.5 mL/min (by C-G formula based on SCr of 0.64 mg/dL). Liver Function Tests: Recent Labs  Lab 01/20/19 0417 01/23/19 0419  AST 27 24  ALT 28 28  ALKPHOS 47 51  BILITOT 0.3 0.5  PROT 5.4* 5.1*  ALBUMIN 2.6* 2.3*   No results for input(s): LIPASE, AMYLASE in the last 168 hours. No results for input(s): AMMONIA in the last 168 hours. Coagulation Profile: No results for input(s): INR, PROTIME in the last 168 hours. Cardiac Enzymes: No results for input(s): CKTOTAL, CKMB, CKMBINDEX, TROPONINI in the last 168 hours. BNP (last 3 results) No results for input(s): PROBNP in the last 8760 hours. HbA1C: No results for input(s): HGBA1C in the last 72 hours. CBG: Recent Labs  Lab 01/24/19 0557 01/24/19 1127 01/24/19 1810 01/25/19 0016 01/25/19 0622  GLUCAP 76 96 98 110* 115*   Lipid Profile: No results for input(s): CHOL, HDL, LDLCALC,  TRIG, CHOLHDL, LDLDIRECT in the last 72 hours. Thyroid Function Tests: No results for input(s): TSH, T4TOTAL, FREET4, T3FREE, THYROIDAB in the last 72 hours. Anemia Panel: No results for input(s): VITAMINB12, FOLATE, FERRITIN, TIBC, IRON, RETICCTPCT in the last 72 hours.    Radiology Studies: I have reviewed all of the imaging during this hospital visit personally     Scheduled Meds: . heparin  5,000 Units Subcutaneous Q8H  . [START ON 01/26/2019] insulin aspart  0-9 Units Subcutaneous Q6H  . latanoprost  1 drop Both Eyes QHS  . levothyroxine  25 mcg Intravenous Daily  . loratadine  10 mg Oral Daily  . pantoprazole (PROTONIX) IV  40 mg Intravenous Q24H  . sodium chloride flush  10-40 mL Intracatheter Q12H  . sucralfate  1 g Oral 4 times per day   Continuous Infusions: . lactated ringers 10 mL/hr at 01/25/19 0600  . potassium chloride    . TPN ADULT (ION) 40 mL/hr at 01/25/19 0600  . TPN ADULT (ION)       LOS: 13 days        Beverley Allender Annett Gulaaniel Branden Shallenberger, MD

## 2019-01-26 LAB — BASIC METABOLIC PANEL
Anion gap: 8 (ref 5–15)
BUN: 20 mg/dL (ref 8–23)
CO2: 26 mmol/L (ref 22–32)
Calcium: 8.1 mg/dL — ABNORMAL LOW (ref 8.9–10.3)
Chloride: 102 mmol/L (ref 98–111)
Creatinine, Ser: 0.6 mg/dL (ref 0.44–1.00)
GFR calc Af Amer: >60 mL/min (ref >60)
GFR calc non Af Amer: >60 mL/min (ref >60)
Glucose, Bld: 143 mg/dL — ABNORMAL HIGH (ref 70–99)
Potassium: 3.9 mmol/L (ref 3.5–5.1)
Sodium: 136 mmol/L (ref 135–145)

## 2019-01-26 LAB — GLUCOSE, CAPILLARY
Glucose-Capillary: 137 mg/dL — ABNORMAL HIGH (ref 70–99)
Glucose-Capillary: 155 mg/dL — ABNORMAL HIGH (ref 70–99)
Glucose-Capillary: 143 mg/dL — ABNORMAL HIGH (ref 70–99)

## 2019-01-26 MED ORDER — TRAVASOL 10 % IV SOLN
INTRAVENOUS | Status: AC
  Administered 2019-01-26: 19:00:00 via INTRAVENOUS
  Filled 2019-01-26: qty 990

## 2019-01-26 MED ORDER — POTASSIUM CHLORIDE 10 MEQ/50ML IV SOLN
10.0000 meq | INTRAVENOUS | Status: AC
  Administered 2019-01-26 (×2): 10 meq via INTRAVENOUS
  Filled 2019-01-26 (×2): qty 50

## 2019-01-26 NOTE — Progress Notes (Signed)
NG tube disconnected from suction. Pt verbalizes understanding to report nausea or vomiting.  Staples removed from midline Abd incision and steri strips placed. Pt tolerated well without any difficulty. Will continue to monitor

## 2019-01-26 NOTE — Progress Notes (Signed)
PROGRESS NOTE    Christie Hinton  QAS:[id number] DOB: 02-14-1928 DOA: 01/12/2019 PCP: Belia Heman, NP    Brief Narrative:  85 year old female who presented abdominal pain, nausea or vomiting. She does have significant past medical history for hypertension, dyslipidemia and GERD. She reported generalized abdominal pain after a meal, associated with vomiting. She presented to the hospital due to progressive abdominal pain. On her initial physical examination her temperature was 97.7, heart rate 76, blood pressure 166/82, oxygen saturation 99%. Her lungs are clear to auscultation bilaterally, heart S1-S2 present rhythmic, the abdomen was soft, generalized abdominal pain to palpation, hypoactive bowel sounds, no lower extremity edema. CT of the abdomen with a small bowel obstruction, dilated loops of small bowel in the left abdomen with mesenteric edema and a well-defined transition point.Findings concerning for closed-loop obstruction, possibly related to an internal hernia.  Patient was admitted to the hospital working diagnosis of small bowel obstruction.  Underwent exploratory laparotomy on 01/14/19, lysis of adhesions.  Complicated with post operative ileus.     Assessment & Plan:   Principal Problem:   SBO (small bowel obstruction) s/p lysis of adhesions 01/14/2019 Active Problems:   Essential hypertension   Mixed hyperlipidemia   1. Small bowel obstructionsp exploratory laparotomy 01/14/19, with post operative ileous.Improved abdominal pain, distention, nausea and vomiting. Positive bowel movement, NG tube has been clamped. Coninue with TPN for nutrition. Supportive IV fluids (tpn), as needed antiemetics (zofran/ compazine) and analgesics (IV fentanyl). IV pantoprazole. Continue npo for now.   2. Hypokalemia, hypophosphatemia. Corrected electrolytes with K at 3,6 and Na at 136, serum bicarbonate at 20,  3. HTN.Blood pressure has remained well controlled.   4.  Hypothyroid.On IVlevothyroxine.   5. Dyslipidemia.On statin.  6. T2DM.Fasting glucose is 143,continue insulin therapy per pharmacy TPN protocol  DVT prophylaxis:enoxaparin Code Status:full Family Communication:no family at the bedside Disposition Plan/ discharge barriers:pending clinical improvement Body mass index is 25.21 kg/m. Malnutrition Type:  Nutrition Problem: Inadequate oral intake Etiology: inability to eat   Malnutrition Characteristics:  Signs/Symptoms: NPO status   Nutrition Interventions:  Interventions: TPN, Ensure Surgery  RN Pressure Injury Documentation:     Consultants:   Surgery   Procedures:   Exploratory laparotomy   Antimicrobials:       Subjective: Patient is feeling better, positive bowel movement, NG tube has been clamped. No nausea or vomiting, no dyspnea or chest pain.   Objective: Vitals:   01/26/19 0349 01/26/19 0609 01/26/19 0747 01/26/19 1125  BP: 123/74 133/68 116/64 124/74  Pulse: 77 81 78 80  Resp: 15 16 18 18   Temp: (!) 97.5 F (36.4 C) 98.1 F (36.7 C) 98.5 F (36.9 C) 97.9 F (36.6 C)  TempSrc: Oral Oral Oral Oral  SpO2: 97% 96% 99% 99%  Weight:      Height:        Intake/Output Summary (Last 24 hours) at 01/26/2019 1136 Last data filed at 01/26/2019 0901 Gross per 24 hour  Intake 1739.66 ml  Output 800 ml  Net 939.66 ml   Filed Weights   01/12/19 0929 01/21/19 1215  Weight: 67.6 kg 71.9 kg    Examination:   General: Not in pain or dyspnea, deconditioned  Neurology: Awake and alert, non focal  E ENT: mild pallor, no icterus, oral mucosa moist Cardiovascular: No JVD. S1-S2 present, rhythmic, no gallops, rubs, or murmurs. Trace lower extremity edema. Pulmonary: positive breath sounds bilaterally, adequate air movement, no wheezing, rhonchi or rales. Gastrointestinal. Abdomen with no organomegaly, non tender,  no rebound or guarding Skin. No rashes Musculoskeletal: no joint deformities      Data Reviewed: I have personally reviewed following labs and imaging studies  CBC: Recent Labs  Lab 01/20/19 0417 01/21/19 0316 01/22/19 0327 01/25/19 0424  WBC 6.2 7.0 7.3 7.6  NEUTROABS 4.0 4.6 5.3 5.6  HGB 9.8* 9.1* 9.0* 9.5*  HCT 31.9* 30.5* 29.9* 31.2*  MCV 84.4 85.0 85.2 84.3  PLT 292 307 280 316   Basic Metabolic Panel: Recent Labs  Lab 01/20/19 0417  01/22/19 0327 01/23/19 0419 01/24/19 0421 01/25/19 0424 01/26/19 0324  NA 138   < > 138 135 134* 135 136  K 3.9   < > 4.1 4.1 4.0 3.8 3.9  CL 106   < > 105 103 101 101 102  CO2 25   < > 25 25 26 25 26   GLUCOSE 127*   < > 131* 107* 98 115* 143*  BUN 38*   < > 34* 26* 25* 19 20  CREATININE 0.64   < > 0.61 0.64 0.64 0.64 0.60  CALCIUM 8.2*   < > 8.1* 7.9* 7.7* 8.1* 8.1*  MG 1.9  --  2.2 2.0 2.1  --   --   PHOS 3.4  --   --  3.3  --   --   --    < > = values in this interval not displayed.   GFR: Estimated Creatinine Clearance: 45.5 mL/min (by C-G formula based on SCr of 0.6 mg/dL). Liver Function Tests: Recent Labs  Lab 01/20/19 0417 01/23/19 0419  AST 27 24  ALT 28 28  ALKPHOS 47 51  BILITOT 0.3 0.5  PROT 5.4* 5.1*  ALBUMIN 2.6* 2.3*   No results for input(s): LIPASE, AMYLASE in the last 168 hours. No results for input(s): AMMONIA in the last 168 hours. Coagulation Profile: No results for input(s): INR, PROTIME in the last 168 hours. Cardiac Enzymes: No results for input(s): CKTOTAL, CKMB, CKMBINDEX, TROPONINI in the last 168 hours. BNP (last 3 results) No results for input(s): PROBNP in the last 8760 hours. HbA1C: No results for input(s): HGBA1C in the last 72 hours. CBG: Recent Labs  Lab 01/25/19 1117 01/25/19 1702 01/25/19 2323 01/26/19 0601 01/26/19 1124  GLUCAP 122* 120* 140* 137* 155*   Lipid Profile: No results for input(s): CHOL, HDL, LDLCALC, TRIG, CHOLHDL, LDLDIRECT in the last 72 hours. Thyroid Function Tests: No results for input(s): TSH, T4TOTAL, FREET4, T3FREE, THYROIDAB  in the last 72 hours. Anemia Panel: No results for input(s): VITAMINB12, FOLATE, FERRITIN, TIBC, IRON, RETICCTPCT in the last 72 hours.    Radiology Studies: I have reviewed all of the imaging during this hospital visit personally     Scheduled Meds: . heparin  5,000 Units Subcutaneous Q8H  . insulin aspart  0-9 Units Subcutaneous Q6H  . latanoprost  1 drop Both Eyes QHS  . levothyroxine  25 mcg Intravenous Daily  . loratadine  10 mg Oral Daily  . pantoprazole (PROTONIX) IV  40 mg Intravenous Q24H  . sodium chloride flush  10-40 mL Intracatheter Q12H  . sucralfate  1 g Oral 4 times per day   Continuous Infusions: . lactated ringers 10 mL/hr at 01/26/19 0600  . potassium chloride    . TPN ADULT (ION) 75 mL/hr at 01/26/19 0600  . TPN ADULT (ION)       LOS: 14 days         Annett Gulaaniel , MD

## 2019-01-26 NOTE — Progress Notes (Signed)
PHARMACY - ADULT TOTAL PARENTERAL NUTRITION CONSULT NOTE   Pharmacy Consult for TPN Indication: Post-operative ileus following surgery for SBO  Patient Measurements: Height: 5' 6.5" (168.9 cm) Weight: 158 lb 9 oz (71.9 kg) IBW/kg (Calculated) : 60.45 TPN AdjBW (KG): 67.6 Body mass index is 25.21 kg/m.   Vitals:   01/26/19 0609 01/26/19 0747  BP: 133/68 116/64  Pulse: 81 78  Resp: 16 18  Temp: 98.1 F (36.7 C) 98.5 F (36.9 C)  SpO2: 96% 99%    Intake/Output Summary (Last 24 hours) at 01/26/2019 1103 Last data filed at 01/26/2019 0901 Gross per 24 hour  Intake 1739.66 ml  Output 800 ml  Net 939.66 ml    Recent Labs    01/24/19 0421 01/25/19 0424 01/26/19 0324  NA 134* 135 136  K 4.0 3.8 3.9  CL 101 101 102  CO2 26 25 26   GLUCOSE 98 115* 143*  BUN 25* 19 20  CREATININE 0.64 0.64 0.60  CALCIUM 7.7* 8.1* 8.1*  MG 2.1  --   --   Corr Ca 6/17 = 9.2  Insulin Requirements: 2 units SSI last 24hr with 10 units insulin added to TPN bag - Hx of DM (diet controlled) noted.  Current Nutrition: NPO, TPN  IVF: LR @ 1010ml/hr  Central access: PICC  TPN start date: 6/10  ASSESSMENT                                                                                        HPI: 85 y/o F with SBO underwent laparotomy and lysis of adhesions on 6.9.20, now on TPN.  Hx of DM (diet-controlled) noted.  Significant events:  6/14: continue TPN, start CL soon and per CCS note may start to wean TPN soon if tolerated 6/16: started clear liquids 6/17: TPN reduced to 1/2 rate per surgery 6/18: +Ileus, attempting to insert NGT but pt refusing.   6/20: Increase TPN back to full dose per surgery  Today:   Glucose (goal CBG<150) -  Insulin in TPN 10 units - CBGs at goal (122-143)  Electrolytes - at goal except K slightly low at 3.9 after IV Kruns x2 & increase in TPN ( Goal Mag >2 , K>4)  Renal - stable  LFTs - all < ULN  TGs - 109 (6/15)  Prealbumin - increased from 8.4 (6/10) to  14.1 (6/15)  NUTRITIONAL GOALS                                                                           KCal:    [id number] / day Protein: 80-95 g /day  Goal TPN rate is 75 ml/hr (provides 1854 KCal/day, 99 g protein/day, 1.8 L/day)  PLAN  Now:  IV Krun 50mEq x2 today At 6pm: Continue custom TPN at goal rate of 75 ml/hr.  10 units insulin in TPN  Standard electrolytes in TPN except Na at 90 and increase K to 65. TPN to contain standard multivitamins daily and trace elements MWF. Continue SSI Novolog sensitive scale q6h  IVF per MD TPN lab panels on Mondays & Thursdays.    Thank you for allowing pharmacy to be a part of this patient's care.  Netta Cedars, PharmD, BCPS 01/26/2019  11:03 AM

## 2019-01-26 NOTE — Progress Notes (Signed)
12 Days Post-Op  Subjective: CC:  Pt sitting in chair, had several BM's yesterday  Objective: Vital signs in last 24 hours: Temp:  [97.5 F (36.4 C)-99.1 F (37.3 C)] 98.5 F (36.9 C) (06/21 0747) Pulse Rate:  [76-81] 78 (06/21 0747) Resp:  [15-18] 18 (06/21 0747) BP: (116-133)/(64-98) 116/64 (06/21 0747) SpO2:  [96 %-99 %] 99 % (06/21 0747) Last BM Date: 01/25/19  Intake/Output from previous day: 06/20 0701 - 06/21 0700 In: 1739.7 [P.O.:120; I.V.:1519.7; IV Piggyback:100] Out: 650 [Emesis/NG output:650] Intake/Output this shift: Total I/O In: 0  Out: 200 [Urine:200]  PE: Gen: Awake and alert, NAD Abd: Soft, less distension, tenderness appropriately around her midline incision. Midline incision with staples intact. No wound separation. Some mild erythema surrounding staples that favors irritation/reactive inflammation rather and is stable Msk: no edema  Lab Results:  Recent Labs    01/25/19 0424  WBC 7.6  HGB 9.5*  HCT 31.2*  PLT 316   BMET Recent Labs    01/25/19 0424 01/26/19 0324  NA 135 136  K 3.8 3.9  CL 101 102  CO2 25 26  GLUCOSE 115* 143*  BUN 19 20  CREATININE 0.64 0.60  CALCIUM 8.1* 8.1*   PT/INR No results for input(s): LABPROT, INR in the last 72 hours. CMP     Component Value Date/Time   NA 136 01/26/2019 0324   K 3.9 01/26/2019 0324   CL 102 01/26/2019 0324   CO2 26 01/26/2019 0324   GLUCOSE 143 (H) 01/26/2019 0324   BUN 20 01/26/2019 0324   CREATININE 0.60 01/26/2019 0324   CALCIUM 8.1 (L) 01/26/2019 0324   PROT 5.1 (L) 01/23/2019 0419   ALBUMIN 2.3 (L) 01/23/2019 0419   AST 24 01/23/2019 0419   ALT 28 01/23/2019 0419   ALKPHOS 51 01/23/2019 0419   BILITOT 0.5 01/23/2019 0419   GFRNONAA >60 01/26/2019 0324   GFRAA >60 01/26/2019 0324   Lipase     Component Value Date/Time   LIPASE 29 01/12/2019 0928       Studies/Results: Dg Abd Portable 1v  Result Date: 01/25/2019 CLINICAL DATA:  Ileus EXAM: PORTABLE ABDOMEN  - 1 VIEW COMPARISON:  01/24/2019 abdominal radiograph FINDINGS: Enteric tube terminates at the esophagogastric junction. Vertical skin staples overlie the medial left abdomen. Partially visualized left total hip arthroplasty. Prominent diffuse small bowel dilatation up to the 6.1 cm diameter, minimally improved. No evidence of pneumatosis or pneumoperitoneum. Minimal colonic stool. No radiopaque nephrolithiasis. IMPRESSION: 1. Enteric tube terminates at the esophagogastric junction. 2. Prominent diffuse small bowel dilatation, minimally improved, compatible with severe postoperative adynamic ileus. Electronically Signed   By: Delbert PhenixJason A Poff M.D.   On: 01/25/2019 07:09   Dg Vangie BickerNaso G Tube Plc W/fl W/rad  Result Date: 01/24/2019 CLINICAL DATA:  Small-bowel distention.  NG tube for decompression. EXAM: NASO G TUBE PLACEMENT WITH FL AND WITH RAD CONTRAST:  None. FLUOROSCOPY TIME:  Fluoroscopy Time:  0 minutes and 48 seconds. Radiation Exposure Index (if provided by the fluoroscopic device): 16.7 mGy Number of Acquired Spot Images: 0 COMPARISON:  Abdomen film from 01/24/2020. Prior NG tube placement 01/15/2019. FINDINGS: NG tube was placed via the patient's right nostril and easily directed into the patient's known moderate to large hiatal hernia. Multiple attempts were made to direct the NG tube through the hernia into the subdiaphragmatic portion of the stomach, but these were unsuccessful. IMPRESSION: Successful NG tube placement into the supradiaphragmatic portion of the stomach in this patient with moderate  to large hiatal hernia. As before, the tube could not be directed into the infra diaphragmatic portion of the stomach due to orientation of the hiatal hernia. I discussed this study with the patient's nurse Beth, at 1451 hours on 01/24/2019. Electronically Signed   By: Misty Stanley M.D.   On: 01/24/2019 14:46    Anti-infectives: Anti-infectives (From admission, onward)   Start     Dose/Rate Route Frequency  Ordered Stop   01/14/19 1103  sodium chloride 0.9 % with cefoTEtan (CEFOTAN) ADS Med    Note to Pharmacy: [name]  : cabinet override      01/14/19 1103 01/14/19 1124   01/14/19 1045  cefoTEtan (CEFOTAN) 2 g in sodium chloride 0.9 % 100 mL IVPB     2 g 200 mL/hr over 30 Minutes Intravenous  Once 01/14/19 1044 01/14/19 1124       Assessment/Plan Thyroid disease HTN HLD DM  - above per medicine team Protein Calorie Malnutrition - Pre-albumin 8.4 (6/10)>>14.1(6/15)  SBO S/p Ex Lap with LOA for SBO due to Omental Adhesion, internal hernia - Dr. Rosendo Gros - 01/14/2019 - POD #12 - suppository for constipation - Mobilize for bowel function - Pulm toliet - Keep K>4 and Mg >2 for bowel function  FEN - TPN, NPO VTE - Heparin, SCDs, Mobilize  ID - Cefotetan periop. None currently. Afebrile. WBC 6,200 Foley - None  Follow up - Dr. Rosendo Gros  POC - Son - Tyrone Schimke [id number])  - Per note from Dr. Lucia Gaskins on 6/14, patient's son and daughter plan to be with her when she returns home   Plan: NG in place in hiatal hernia.  AXR shows mild improvement and SB dilation c/w ileus.  Will clamp NG today but keep in place.  Return to [facility] overnight.  Repeat AXR in AM     LOS: 14 days     Rosario Adie, MD  [facility]

## 2019-01-27 LAB — DIFFERENTIAL
Abs Immature Granulocytes: 0.09 10*3/uL — ABNORMAL HIGH (ref 0.00–0.07)
Basophils Absolute: 0 10*3/uL (ref 0.0–0.1)
Basophils Relative: 1 %
Eosinophils Absolute: 0.1 10*3/uL (ref 0.0–0.5)
Eosinophils Relative: 1 %
Immature Granulocytes: 1 %
Lymphocytes Relative: 17 %
Lymphs Abs: 1.1 10*3/uL (ref 0.7–4.0)
Monocytes Absolute: 0.7 10*3/uL (ref 0.1–1.0)
Monocytes Relative: 11 %
Neutro Abs: 4.6 10*3/uL (ref 1.7–7.7)
Neutrophils Relative %: 69 %

## 2019-01-27 LAB — GLUCOSE, CAPILLARY
Glucose-Capillary: 158 mg/dL — ABNORMAL HIGH (ref 70–99)
Glucose-Capillary: 147 mg/dL — ABNORMAL HIGH (ref 70–99)
Glucose-Capillary: 163 mg/dL — ABNORMAL HIGH (ref 70–99)
Glucose-Capillary: 141 mg/dL — ABNORMAL HIGH (ref 70–99)

## 2019-01-27 LAB — PREALBUMIN: Prealbumin: 12.3 mg/dL — ABNORMAL LOW (ref 18–38)

## 2019-01-27 LAB — CBC
HCT: 31.9 % — ABNORMAL LOW (ref 36.0–46.0)
Hemoglobin: 9.4 g/dL — ABNORMAL LOW (ref 12.0–15.0)
MCH: 24.9 pg — ABNORMAL LOW (ref 26.0–34.0)
MCHC: 29.5 g/dL — ABNORMAL LOW (ref 30.0–36.0)
MCV: 84.4 fL (ref 80.0–100.0)
Platelets: 336 10*3/uL (ref 150–400)
RBC: 3.78 MIL/uL — ABNORMAL LOW (ref 3.87–5.11)
RDW: 16.2 % — ABNORMAL HIGH (ref 11.5–15.5)
WBC: 6.6 10*3/uL (ref 4.0–10.5)
nRBC: 0 % (ref 0.0–0.2)

## 2019-01-27 LAB — COMPREHENSIVE METABOLIC PANEL
ALT: 26 U/L (ref 0–44)
AST: 22 U/L (ref 15–41)
Albumin: 2.6 g/dL — ABNORMAL LOW (ref 3.5–5.0)
Alkaline Phosphatase: 53 U/L (ref 38–126)
Anion gap: 9 (ref 5–15)
BUN: 25 mg/dL — ABNORMAL HIGH (ref 8–23)
CO2: 23 mmol/L (ref 22–32)
Calcium: 8.1 mg/dL — ABNORMAL LOW (ref 8.9–10.3)
Chloride: 103 mmol/L (ref 98–111)
Creatinine, Ser: 0.53 mg/dL (ref 0.44–1.00)
GFR calc Af Amer: >60 mL/min (ref >60)
GFR calc non Af Amer: >60 mL/min (ref >60)
Glucose, Bld: 113 mg/dL — ABNORMAL HIGH (ref 70–99)
Potassium: 4.5 mmol/L (ref 3.5–5.1)
Sodium: 135 mmol/L (ref 135–145)
Total Bilirubin: 0.3 mg/dL (ref 0.3–1.2)
Total Protein: 5.6 g/dL — ABNORMAL LOW (ref 6.5–8.1)

## 2019-01-27 LAB — TRIGLYCERIDES: Triglycerides: 95 mg/dL (ref <150)

## 2019-01-27 LAB — PHOSPHORUS: Phosphorus: 3.4 mg/dL (ref 2.5–4.6)

## 2019-01-27 LAB — MAGNESIUM: Magnesium: 2.2 mg/dL (ref 1.7–2.4)

## 2019-01-27 MED ORDER — MORPHINE SULFATE (PF) 2 MG/ML IV SOLN
0.5000 mg | INTRAVENOUS | Status: DC | PRN
  Administered 2019-01-28 – 2019-01-29 (×3): 1 mg via INTRAVENOUS
  Filled 2019-01-27 (×3): qty 1

## 2019-01-27 MED ORDER — TRAVASOL 10 % IV SOLN
INTRAVENOUS | Status: AC
  Administered 2019-01-27: 19:00:00 via INTRAVENOUS
  Filled 2019-01-27: qty 990

## 2019-01-27 NOTE — Progress Notes (Signed)
Physical Therapy Treatment Patient Details Name: Christie Hinton MRN: [id number] DOB: Mar 02, 1928 Today's Date: 01/27/2019    History of Present Illness 85 yo female admitted with SBO. S/P lysis of adhesions 01/14/19. Hx of DM    PT Comments    Pt with nausea earlier however improved and pt agreeable to ambulate.  Pt has been using rollator and would benefit from rollator upon d/c.  Pt with NG tube which was reconnected to suction after ambulating.  Follow Up Recommendations  Home health PT;Supervision - Intermittent     Equipment Recommendations  Other (comment)(has been using rollator in acute setting, if unable to obtain rollator, then RW)    Recommendations for Other Services       Precautions / Restrictions Precautions Precautions: Fall Precaution Comments: NG tube    Mobility  Bed Mobility Overal bed mobility: Needs Assistance Bed Mobility: Sit to Supine;Supine to Sit     Supine to sit: Min assist Sit to supine: Min guard   General bed mobility comments: min/guard for safety and lines, assist for trunk upright  Transfers Overall transfer level: Needs assistance Equipment used: 4-wheeled walker Transfers: Sit to/from Stand Sit to Stand: Min guard         General transfer comment: min/guard for safety  Ambulation/Gait Ambulation/Gait assistance: Min guard Gait Distance (Feet): 500 Feet Assistive device: 4-wheeled walker Gait Pattern/deviations: Step-through pattern     General Gait Details: cues with turning in tight spaces in room for increased safety, ambulated around unit however requested return to bed due to fatigue upon returning to room   Stairs             Wheelchair Mobility    Modified Rankin (Stroke Patients Only)       Balance                                            Cognition Arousal/Alertness: Awake/alert Behavior During Therapy: WFL for tasks assessed/performed Overall Cognitive Status: Within Functional  Limits for tasks assessed                                        Exercises      General Comments        Pertinent Vitals/Pain Pain Assessment: No/denies pain Pain Intervention(s): Repositioned;Monitored during session    Home Living                      Prior Function            PT Goals (current goals can now be found in the care plan section) Progress towards PT goals: Progressing toward goals    Frequency           PT Plan Current plan remains appropriate;Equipment recommendations need to be updated    Co-evaluation              AM-PAC PT "6 Clicks" Mobility   Outcome Measure  Help needed turning from your back to your side while in a flat bed without using bedrails?: A Little Help needed moving from lying on your back to sitting on the side of a flat bed without using bedrails?: A Little Help needed moving to and from a bed to a chair (including a wheelchair)?: A Little Help needed standing  up from a chair using your arms (e.g., wheelchair or bedside chair)?: A Little Help needed to walk in hospital room?: A Little Help needed climbing 3-5 steps with a railing? : A Little 6 Click Score: 18    End of Session   Activity Tolerance: Patient tolerated treatment well Patient left: in bed;with bed alarm set;with call bell/phone within reach Nurse Communication: Mobility status PT Visit Diagnosis: Unsteadiness on feet (R26.81);Muscle weakness (generalized) (M62.81)     Time: 3151-7616 PT Time Calculation (min) (ACUTE ONLY): 14 min  Charges:  $Gait Training: 8-22 mins          Carmelia Bake, PT, DPT Acute Rehabilitation Services Office: [phone] Pager: [phone]    Trena Platt 01/27/2019, 4:14 PM

## 2019-01-27 NOTE — Progress Notes (Signed)
PROGRESS NOTE    Christie Hinton  ZOX:[id number] DOB: 04-Apr-1928 DOA: 01/12/2019 PCP: Sharren BridgeSellers, Billie H, NP    Brief Narrative:  85 year old female who presented abdominal pain, nausea or vomiting. She does have significant past medical history for hypertension, dyslipidemia and GERD. She reported generalized abdominal pain after a meal, associated with vomiting. She presented to the hospital due to progressive abdominal pain. On her initial physical examination her temperature was 97.7, heart rate 76, blood pressure 166/82, oxygen saturation 99%. Her lungs are clear to auscultation bilaterally, heart S1-S2 present rhythmic, the abdomen was soft, generalized abdominal pain to palpation, hypoactive bowel sounds, no lower extremity edema. CT of the abdomen with a small bowel obstruction, dilated loops of small bowel in the left abdomen with mesenteric edema and a well-defined transition point.Findings concerning for closed-loop obstruction, possibly related to an internal hernia.  Patient was admitted to the hospital working diagnosis of small bowel obstruction.  Underwent exploratory laparotomy on 01/14/19, lysis of adhesions. Complicated with post operative ileus.   Very slow to recover ileuos, required NG tube to low intermittent suction.    Assessment & Plan:   Principal Problem:   SBO (small bowel obstruction) s/p lysis of adhesions 01/14/2019 Active Problems:   Essential hypertension   Mixed hyperlipidemia   1. Small bowel obstructionsp exploratory laparotomy 01/14/19, with post operative ileous.This am patient with recurrent abdominal discomfort, her NG tube continue to be clamped. No flatus or bowel movement today, continue with TPN for nutrition, along with supportive medical therapy with analgesics, antiemetics and antiacids.  2. Hypokalemia, hypophosphatemia.Stable electrolytes with K at 4,5, P as 3,4 with a preserved renal function with serum cr at 0.53.   3.  HTN.Continue blood pressure monitoring.   4. Hypothyroid.Continue with IVlevothyroxine.   5. Dyslipidemia.Continue statin therapy.   6. T2DM.Fasting glucose is 113,will continue insulin therapy per pharmacy TPN protocol. Patient continue to be npo.   DVT prophylaxis:enoxaparin Code Status:full Family Communication:no family at the bedside Disposition Plan/ discharge barriers:pending clinical improvement Body mass index is 25.21 kg/m.  Body mass index is 25.21 kg/m. Malnutrition Type:  Nutrition Problem: Inadequate oral intake Etiology: inability to eat   Malnutrition Characteristics:  Signs/Symptoms: NPO status   Nutrition Interventions:  Interventions: TPN, Ensure Surgery  RN Pressure Injury Documentation:     Consultants:   Surgery   Procedures:   Exploratory laparotomy 01/14/19.   Antimicrobials:       Subjective: This am patient with recurrent abdominal pain, dull in nature, moderate in intensity, no flatus or bowel movement, no chest pain or dyspnea.   Objective: Vitals:   01/26/19 1125 01/26/19 1539 01/26/19 2030 01/27/19 0511  BP: 124/74 137/72 130/77 127/76  Pulse: 80 78 80 80  Resp: 18 18 18 16   Temp: 97.9 F (36.6 C) 98.4 F (36.9 C) 98 F (36.7 C) 98.3 F (36.8 C)  TempSrc: Oral Oral Oral Oral  SpO2: 99% 97% 97% 96%  Weight:      Height:        Intake/Output Summary (Last 24 hours) at 01/27/2019 1305 Last data filed at 01/27/2019 1128 Gross per 24 hour  Intake 1951.75 ml  Output 602 ml  Net 1349.75 ml   Filed Weights   01/12/19 0929 01/21/19 1215  Weight: 67.6 kg 71.9 kg    Examination:   General: deconditioned  Neurology: Awake and alert, non focal  E ENT: no pallor, no icterus, oral mucosa moist Cardiovascular: No JVD. S1-S2 present, rhythmic, no gallops, rubs, or murmurs.  No lower extremity edema. Pulmonary: positive breath sounds bilaterally, adequate air movement, no wheezing, rhonchi or rales.  Gastrointestinal. Abdomen mild to moderate distention with no organomegaly, mild tender to deep palpation, no rebound or guarding Skin. No rashes Musculoskeletal: no joint deformities     Data Reviewed: I have personally reviewed following labs and imaging studies  CBC: Recent Labs  Lab 01/21/19 0316 01/22/19 0327 01/25/19 0424 01/27/19 0349  WBC 7.0 7.3 7.6 6.6  NEUTROABS 4.6 5.3 5.6 4.6  HGB 9.1* 9.0* 9.5* 9.4*  HCT 30.5* 29.9* 31.2* 31.9*  MCV 85.0 85.2 84.3 84.4  PLT 307 280 316 850   Basic Metabolic Panel: Recent Labs  Lab 01/22/19 0327 01/23/19 0419 01/24/19 0421 01/25/19 0424 01/26/19 0324 01/27/19 0349  NA 138 135 134* 135 136 135  K 4.1 4.1 4.0 3.8 3.9 4.5  CL 105 103 101 101 102 103  CO2 25 25 26 25 26 23   GLUCOSE 131* 107* 98 115* 143* 113*  BUN 34* 26* 25* 19 20 25*  CREATININE 0.61 0.64 0.64 0.64 0.60 0.53  CALCIUM 8.1* 7.9* 7.7* 8.1* 8.1* 8.1*  MG 2.2 2.0 2.1  --   --  2.2  PHOS  --  3.3  --   --   --  3.4   GFR: Estimated Creatinine Clearance: 45.5 mL/min (by C-G formula based on SCr of 0.53 mg/dL). Liver Function Tests: Recent Labs  Lab 01/23/19 0419 01/27/19 0349  AST 24 22  ALT 28 26  ALKPHOS 51 53  BILITOT 0.5 0.3  PROT 5.1* 5.6*  ALBUMIN 2.3* 2.6*   No results for input(s): LIPASE, AMYLASE in the last 168 hours. No results for input(s): AMMONIA in the last 168 hours. Coagulation Profile: No results for input(s): INR, PROTIME in the last 168 hours. Cardiac Enzymes: No results for input(s): CKTOTAL, CKMB, CKMBINDEX, TROPONINI in the last 168 hours. BNP (last 3 results) No results for input(s): PROBNP in the last 8760 hours. HbA1C: No results for input(s): HGBA1C in the last 72 hours. CBG: Recent Labs  Lab 01/26/19 1124 01/26/19 1734 01/27/19 0116 01/27/19 0637 01/27/19 1141  GLUCAP 155* 143* 158* 147* 163*   Lipid Profile: Recent Labs    01/27/19 0349  TRIG 95   Thyroid Function Tests: No results for input(s): TSH,  T4TOTAL, FREET4, T3FREE, THYROIDAB in the last 72 hours. Anemia Panel: No results for input(s): VITAMINB12, FOLATE, FERRITIN, TIBC, IRON, RETICCTPCT in the last 72 hours.    Radiology Studies: I have reviewed all of the imaging during this hospital visit personally     Scheduled Meds: . heparin  5,000 Units Subcutaneous Q8H  . insulin aspart  0-9 Units Subcutaneous Q6H  . latanoprost  1 drop Both Eyes QHS  . levothyroxine  25 mcg Intravenous Daily  . loratadine  10 mg Oral Daily  . pantoprazole (PROTONIX) IV  40 mg Intravenous Q24H  . sodium chloride flush  10-40 mL Intracatheter Q12H  . sucralfate  1 g Oral 4 times per day   Continuous Infusions: . lactated ringers 10 mL/hr at 01/27/19 0600  . TPN ADULT (ION) 75 mL/hr at 01/27/19 0600  . TPN ADULT (ION)       LOS: 15 days        Alvis Pulcini Gerome Apley, MD

## 2019-01-27 NOTE — Progress Notes (Addendum)
13 Days Post-Op  Subjective: CC:  Patient reports doing well with clamping trial yesterday. Only had a few sips of water. Currently on LIWS. Reports no abdominal pain, N/V. Passing flatus. 2 soft BM's yesterday. Did not walk in halls yesterday, just in room. Did not require pain or nausea medication yesterday.   Objective: Vital signs in last 24 hours: Temp:  [97.9 F (36.6 C)-98.4 F (36.9 C)] 98.3 F (36.8 C) (06/22 0511) Pulse Rate:  [78-80] 80 (06/22 0511) Resp:  [16-18] 16 (06/22 0511) BP: (124-137)/(72-77) 127/76 (06/22 0511) SpO2:  [96 %-99 %] 96 % (06/22 0511) Last BM Date: 01/26/19  Intake/Output from previous day: 06/21 0701 - 06/22 0700 In: 1951.8 [P.O.:70; I.V.:1831.8; IV Piggyback:50] Out: 1300 [Urine:1100; Emesis/NG output:200] Intake/Output this shift: No intake/output data recorded.  PE: Gen: Awake and alert, NAD Lungs: Normal effort and rate Abd: Soft, ND, NT, +BS. Midline wound c/d/i with steri-strips over Msk; No edema   Lab Results:  Recent Labs    01/25/19 0424 01/27/19 0349  WBC 7.6 6.6  HGB 9.5* 9.4*  HCT 31.2* 31.9*  PLT 316 336   BMET Recent Labs    01/26/19 0324 01/27/19 0349  NA 136 135  K 3.9 4.5  CL 102 103  CO2 26 23  GLUCOSE 143* 113*  BUN 20 25*  CREATININE 0.60 0.53  CALCIUM 8.1* 8.1*   PT/INR No results for input(s): LABPROT, INR in the last 72 hours. CMP     Component Value Date/Time   NA 135 01/27/2019 0349   K 4.5 01/27/2019 0349   CL 103 01/27/2019 0349   CO2 23 01/27/2019 0349   GLUCOSE 113 (H) 01/27/2019 0349   BUN 25 (H) 01/27/2019 0349   CREATININE 0.53 01/27/2019 0349   CALCIUM 8.1 (L) 01/27/2019 0349   PROT 5.6 (L) 01/27/2019 0349   ALBUMIN 2.6 (L) 01/27/2019 0349   AST 22 01/27/2019 0349   ALT 26 01/27/2019 0349   ALKPHOS 53 01/27/2019 0349   BILITOT 0.3 01/27/2019 0349   GFRNONAA >60 01/27/2019 0349   GFRAA >60 01/27/2019 0349   Lipase     Component Value Date/Time   LIPASE 29 01/12/2019  0928       Studies/Results: No results found.  Anti-infectives: Anti-infectives (From admission, onward)   Start     Dose/Rate Route Frequency Ordered Stop   01/14/19 1103  sodium chloride 0.9 % with cefoTEtan (CEFOTAN) ADS Med    Note to Pharmacy: [name]  : cabinet override      01/14/19 1103 01/14/19 1124   01/14/19 1045  cefoTEtan (CEFOTAN) 2 g in sodium chloride 0.9 % 100 mL IVPB     2 g 200 mL/hr over 30 Minutes Intravenous  Once 01/14/19 1044 01/14/19 1124       Assessment/Plan  Thyroid disease HTN HLD DM  - above per medicine team Protein Calorie Malnutrition - Pre-albumin 8.4 (6/10)>>14.1(6/15)>>12.3(6/22)  SBO S/p Ex Lap with LOA for SBO due to Omental Adhesion,internal hernia - Dr. Rosendo Gros - 01/14/2019 - POD #13 - Mobilize for bowel function - Pulm toliet - Keep K>4 and Mg >2 for bowel function - Clamp NG, see if tolerates sips of clears from the floor. Recheck later today. Hopefully will be able to d/c at that time. If d/c'd will need to be started on bowel regimen. Takes miralax and milk of mag at home daily.   FEN - TPN, NPO (sips of clears from the floor) VTE - Heparin, SCDs,  Mobilize  ID - Cefotetan periop. None currently. Afebrile. WBC 6,600 Foley - None  Follow up - Dr. Derrell Lollingamirez  POC - Son - Rockie Neighboursd Bly [phone]             - Per note from Dr. Ezzard StandingNewman on 6/14, patient's son and daughter plan to be with her when she returns home    LOS: 15 days    Jacinto HalimMichael M Naome Brigandi , [facility] 01/27/2019, 8:39 AM Pager: [phone]

## 2019-01-27 NOTE — Progress Notes (Signed)
Patient attempted to ambulate in hall but became extremely nauseated and stated "I think I am going to throw up". Patient returned to room/bed. NG reconnected to LIWS and did not return any further drainage. Aditya Nastasi, RN 

## 2019-01-27 NOTE — Progress Notes (Signed)
PHARMACY - ADULT TOTAL PARENTERAL NUTRITION CONSULT NOTE   Pharmacy Consult for TPN Indication: Post-operative ileus following surgery for SBO  Patient Measurements: Height: 5' 6.5" (168.9 cm) Weight: 158 lb 9 oz (71.9 kg) IBW/kg (Calculated) : 60.45 TPN AdjBW (KG): 67.6 Body mass index is 25.21 kg/m.   Vitals:   01/26/19 2030 01/27/19 0511  BP: 130/77 127/76  Pulse: 80 80  Resp: 18 16  Temp: 98 F (36.7 C) 98.3 F (36.8 C)  SpO2: 97% 96%    Intake/Output Summary (Last 24 hours) at 01/27/2019 1054 Last data filed at 01/27/2019 1000 Gross per 24 hour  Intake 1951.75 ml  Output 1101 ml  Net 850.75 ml    Recent Labs    01/26/19 0324 01/27/19 0349  NA 136 135  K 3.9 4.5  CL 102 103  CO2 26 23  GLUCOSE 143* 113*  BUN 20 25*  CREATININE 0.60 0.53  CALCIUM 8.1* 8.1*  PHOS  --  3.4  MG  --  2.2  ALBUMIN  --  2.6*  ALKPHOS  --  53  AST  --  22  ALT  --  26  BILITOT  --  0.3  TRIG  --  95  PREALBUMIN  --  12.3*  Corr Ca 6/17 = 9.2  Current Nutrition: NPO, TPN (sips of clears) IVF: LR @ 8710ml/hr Central access: PICC  TPN start date: 6/10  ASSESSMENT                                                                                        HPI: 85 y/o F with SBO underwent laparotomy and lysis of adhesions on 01/14/19, now on TPN. Hx of DM (diet-controlled) noted.  Significant events:  6/14: continue TPN, start CL soon and per CCS note may start to wean TPN soon if tolerated 6/16: started clear liquids 6/17: TPN reduced to 1/2 rate per surgery 6/18: +ileus, attempting to insert NGT but pt refusing.   6/20: Increase TPN back to full dose per surgery 6/22: repeat clamping trial  Today:   Glucose (goal CBG<150)  Hx DM - diet controlled  CBGs mostly at goal (max 158)   Received 2 units sensitive SSI yesterday; 10 units regular insulin in TPN  Electrolytes - all WNL (K repleted yesterday)  Renal - SCr, bicarb, UOP WNL, BUN:SCr elevated   LFTs - stable  WNL  TGs - stable WNL  Prealbumin - slightly worse 14.1 >> 12.3  NUTRITIONAL GOALS                                                                           KCal:    1750-2025 / day Protein: 80-95 g /day  Goal TPN rate is 75 ml/hr (provides 1854 KCal/day, 99 g protein/day, 1.8 L/day)  Keep Mg 2.0+, K 4.0+  PLAN  At 6pm: Continue custom TPN at goal rate of 75 ml/hr.  10 units insulin in TPN  Standard electrolytes in TPN except increased Na and K; Cl:Ac = 1:1  TPN to contain standard multivitamins daily and trace elements MWF.  Continue SSI Novolog sensitive scale q6h   IVF per MD  TPN lab panels on Mondays & Thursdays  Silas, PharmD, BCPS [phone] 01/27/2019, 10:54 AM

## 2019-01-27 NOTE — Progress Notes (Signed)
NG connected back to suction. Residual was 44mls. Donne Hazel, RN

## 2019-01-28 ENCOUNTER — Inpatient Hospital Stay (HOSPITAL_COMMUNITY): Payer: Medicare Other

## 2019-01-28 LAB — BASIC METABOLIC PANEL
Anion gap: 8 (ref 5–15)
BUN: 29 mg/dL — ABNORMAL HIGH (ref 8–23)
CO2: 24 mmol/L (ref 22–32)
Calcium: 8.2 mg/dL — ABNORMAL LOW (ref 8.9–10.3)
Chloride: 103 mmol/L (ref 98–111)
Creatinine, Ser: 0.62 mg/dL (ref 0.44–1.00)
GFR calc Af Amer: >60 mL/min (ref >60)
GFR calc non Af Amer: >60 mL/min (ref >60)
Glucose, Bld: 143 mg/dL — ABNORMAL HIGH (ref 70–99)
Potassium: 4.5 mmol/L (ref 3.5–5.1)
Sodium: 135 mmol/L (ref 135–145)

## 2019-01-28 LAB — GLUCOSE, CAPILLARY
Glucose-Capillary: 125 mg/dL — ABNORMAL HIGH (ref 70–99)
Glucose-Capillary: 145 mg/dL — ABNORMAL HIGH (ref 70–99)
Glucose-Capillary: 147 mg/dL — ABNORMAL HIGH (ref 70–99)
Glucose-Capillary: 149 mg/dL — ABNORMAL HIGH (ref 70–99)
Glucose-Capillary: 154 mg/dL — ABNORMAL HIGH (ref 70–99)

## 2019-01-28 MED ORDER — TRAVASOL 10 % IV SOLN
INTRAVENOUS | Status: AC
  Administered 2019-01-28: 18:00:00 via INTRAVENOUS
  Filled 2019-01-28: qty 990

## 2019-01-28 MED ORDER — INSULIN ASPART 100 UNIT/ML ~~LOC~~ SOLN
0.0000 [IU] | Freq: Four times a day (QID) | SUBCUTANEOUS | Status: DC
  Administered 2019-01-28 – 2019-01-29 (×6): 2 [IU] via SUBCUTANEOUS
  Administered 2019-01-29: 3 [IU] via SUBCUTANEOUS
  Administered 2019-01-30: 2 [IU] via SUBCUTANEOUS
  Administered 2019-01-30: 06:00:00 3 [IU] via SUBCUTANEOUS
  Administered 2019-01-30 – 2019-01-31 (×2): 2 [IU] via SUBCUTANEOUS

## 2019-01-28 NOTE — Progress Notes (Addendum)
14 Days Post-Op  Subjective: CC: Nausea Patient reports that she became nauseated yesterday with clamping trial and trying to drink sips of clears from the floor. Felt like she was going to throw up when she was walking in halls. This improved with zofran. She was rehooked to [employer] and have 2ml return, but nurse notes increase in output overnight. 850cc/24 hours. She reports her nausea and upper abdominal discomfort  improved when she began passing a large amount of gas last night. Just mild nausea this morning, no pain.   Objective: Vital signs in last 24 hours: Temp:  [98.5 F (36.9 C)-98.9 F (37.2 C)] 98.9 F (37.2 C) (06/23 0722) Pulse Rate:  [79-85] 85 (06/23 0601) Resp:  [18] 18 (06/23 0601) BP: (108-130)/(63-73) 108/63 (06/23 0601) SpO2:  [95 %-99 %] 95 % (06/23 0601) Last BM Date: 01/26/19  Intake/Output from previous day: 06/22 0701 - 06/23 0700 In: 2020.7 [I.V.:1970.7; NG/GT:50] Out: 0350 [Urine:602; Emesis/NG output:850; Stool:2] Intake/Output this shift: No intake/output data recorded.  PE: Gen: Awake and alert, NAD Lungs: Normal effort and rate Abd: Soft, ND, NT, +BS. Midline wound c/d/i with steri-strips over Msk; No edema   Lab Results:  Recent Labs    01/27/19 0349  WBC 6.6  HGB 9.4*  HCT 31.9*  PLT 336   BMET Recent Labs    01/27/19 0349 01/28/19 0427  NA 135 135  K 4.5 4.5  CL 103 103  CO2 23 24  GLUCOSE 113* 143*  BUN 25* 29*  CREATININE 0.53 0.62  CALCIUM 8.1* 8.2*   PT/INR No results for input(s): LABPROT, INR in the last 72 hours. CMP     Component Value Date/Time   NA 135 01/28/2019 0427   K 4.5 01/28/2019 0427   CL 103 01/28/2019 0427   CO2 24 01/28/2019 0427   GLUCOSE 143 (H) 01/28/2019 0427   BUN 29 (H) 01/28/2019 0427   CREATININE 0.62 01/28/2019 0427   CALCIUM 8.2 (L) 01/28/2019 0427   PROT 5.6 (L) 01/27/2019 0349   ALBUMIN 2.6 (L) 01/27/2019 0349   AST 22 01/27/2019 0349   ALT 26 01/27/2019 0349   ALKPHOS 53  01/27/2019 0349   BILITOT 0.3 01/27/2019 0349   GFRNONAA >60 01/28/2019 0427   GFRAA >60 01/28/2019 0427   Lipase     Component Value Date/Time   LIPASE 29 01/12/2019 0928       Studies/Results: No results found.  Anti-infectives: Anti-infectives (From admission, onward)   Start     Dose/Rate Route Frequency Ordered Stop   01/14/19 1103  sodium chloride 0.9 % with cefoTEtan (CEFOTAN) ADS Med    Note to Pharmacy: [name]  : cabinet override      01/14/19 1103 01/14/19 1124   01/14/19 1045  cefoTEtan (CEFOTAN) 2 g in sodium chloride 0.9 % 100 mL IVPB     2 g 200 mL/hr over 30 Minutes Intravenous  Once 01/14/19 1044 01/14/19 1124       Assessment/Plan Thyroid disease HTN HLD DM - above per medicine team Protein Calorie Malnutrition- Pre-albumin 8.4 (6/10)>>14.1(6/15)>>12.3(6/22)  SBO S/p Ex Lap with LOA for SBO due to Omental Adhesion,internal hernia - Dr. Rosendo Gros - 01/14/2019 - POD #14 - Mobilize for bowel function - Pulm toliet - Keep K>4 and Mg >2 for bowel function - Check abd xray this morning. Will recheck on nausea after finishing rounding to see if patient can have another clamping trial today.   FEN - TPN, NPO  VTE -  Heparin, SCDs, Mobilize  ID - Cefotetan periop. None currently. Afebrile. WBC 6,600 (6/22) Foley - None  Follow up- Dr. Derrell Lollingamirez  POC - Son - Rockie Neighboursd Bly [phone]) - Per note from Dr. Ezzard StandingNewman on 6/14, patient's son and daughter plan to be with her when she returns home    LOS: 16 days    Jacinto HalimMichael M Joscelin Fray , [facility] 01/28/2019, 8:49 AM Pager: [phone]

## 2019-01-28 NOTE — Progress Notes (Signed)
Nutrition Follow-up  INTERVENTION:   -TPN per Pharmacy -Will monitor for diet advancement -Recommend new weight measurement (last recorded 6/16)  NUTRITION DIAGNOSIS:   Inadequate oral intake related to inability to eat as evidenced by NPO status.  Ongoing.  GOAL:   Patient will meet greater than or equal to 90% of their needs  Meeting with TPN.  MONITOR:   Diet advancement, Labs, Weight trends, I & O's(TPN)  ASSESSMENT:   85 year old female with history of HTN, hyperlipidemia, and GERD who presented initially to [place] with complaints of generalized abdominal pain and vomiting that started after dinner. Patient reported lower abdominal pain which progressively got worse and developed N/V after that. Last bowel movement was 2 days before admission. CT abdomen showed SBO with dilated loops of small bowel. Findings concerning for closed-loop obstruction. General surgery consulted.  **RD working remotely**  Pt continues to be NPO since 6/18. Pt has continued to struggle with nausea when NGT is clamped. NGT placed back to suction, output x 24 hrs: 950 ml.   TPN was increased back to goal rate 6/20, 75 ml/hr providing 1854 kcal and 99g protein.   No new weights have been measured since 6/16.  Medications reviewed. Labs reviewed: CBGs: 125-154  Diet Order:   Diet Order            Diet NPO time specified Except for: Ice Chips, Sips with Meds  Diet effective now              EDUCATION NEEDS:   No education needs have been identified at this time  Skin:  Skin Assessment: Skin Integrity Issues: Skin Integrity Issues:: Incisions Incisions: abdomen (6/9)  Last BM:  6/21  Height:   Ht Readings from Last 1 Encounters:  01/12/19 5' 6.5" (1.689 m)    Weight:   Wt Readings from Last 1 Encounters:  01/21/19 71.9 kg    Ideal Body Weight:  60.2 kg  BMI:  Body mass index is 25.21 kg/m.  Estimated Nutritional Needs:   Kcal:  1750-2025  kcal  Protein:  80-95 grams  Fluid:  >/= 1.8 L/day  Clayton Bibles, MS, RD, LDN [place] [facility] Pager: [phone] After Hours Pager: [phone]

## 2019-01-28 NOTE — Progress Notes (Signed)
PROGRESS NOTE    Christie ChampionMarian Hinton  ZOX:[id number] DOB: July 18, 1928 DOA: 01/12/2019 PCP: Sharren BridgeSellers, Billie H, NP    Brief Narrative:  85 year old female who presented abdominal pain, nausea or vomiting. She does have significant past medical history for hypertension, dyslipidemia and GERD. She reported generalized abdominal pain after a meal, associated with vomiting. She presented to the hospital due to progressive abdominal pain. On her initial physical examination her temperature was 97.7, heart rate 76, blood pressure 166/82, oxygen saturation 99%. Her lungs are clear to auscultation bilaterally, heart S1-S2 present rhythmic, the abdomen was soft, generalized abdominal pain to palpation, hypoactive bowel sounds, no lower extremity edema. CT of the abdomen with a small bowel obstruction, dilated loops of small bowel in the left abdomen with mesenteric edema and a well-defined transition point.Findings concerning for closed-loop obstruction, possibly related to an internal hernia.  Patient was admitted to the hospital working diagnosis of small bowel obstruction.  Underwent exploratory laparotomy on 01/14/19, lysis of adhesions. Complicated with post operative ileus.   Very slow to recover ileuos, required NG tube to low intermittent suction.    Assessment & Plan:   Principal Problem:   SBO (small bowel obstruction) s/p lysis of adhesions 01/14/2019 Active Problems:   Essential hypertension   Mixed hyperlipidemia   1. Small bowel obstructionsp exploratory laparotomy 01/14/19, with post operative ileus.Continue tube to suction, with good toleration, patient with no further nausea, persistent right upper quadrant abdominal pain. She was able to ambulate and seat in the chair. Abdominal films with persistent ileus. Continue NPO for now, will follow with surgery recommendations. Continue pain control with fentanyl.   2. Hypokalemia, hypophosphatemia. Corrected electrolytes with K at  4,5 and preserved renal function, cr at 0,62.  3. HTN.Blood pressure is controlled, systolic 107 mmHg, will continue to hold on antihypertensive agents.   4. Hypothyroid.On IVlevothyroxine, change to po when patient tolerating po route.  5. Dyslipidemia.Statin therapy.    6. T2DM.Fasting glucose is 143 today. Patient getting TPN and insulin per pharmacy protocol.  DVT prophylaxis:enoxaparin Code Status:full Family Communication:no family at the bedside Disposition Plan/ discharge barriers:pending clinical improvement   Body mass index is 25.21 kg/m. Malnutrition Type:  Nutrition Problem: Inadequate oral intake Etiology: inability to eat   Malnutrition Characteristics:  Signs/Symptoms: NPO status   Nutrition Interventions:  Interventions: Refer to RD note for recommendations, TPN  RN Pressure Injury Documentation:     Consultants:   Surgery   Procedures:   Exploratory laparotomy 06/09   Antimicrobials:       Subjective: Patient is feeling better than yesterday, but continue to have NG tube to suction, no nausea or vomiting, no chest pain or dyspnea.   Objective: Vitals:   01/27/19 2054 01/28/19 0601 01/28/19 0722 01/28/19 1418  BP: 117/72 108/63  107/63  Pulse: 85 85  79  Resp: 18 18  16   Temp: 98.5 F (36.9 C) 98.7 F (37.1 C) 98.9 F (37.2 C) 98.5 F (36.9 C)  TempSrc: Oral Oral  Oral  SpO2: 96% 95%  94%  Weight:      Height:        Intake/Output Summary (Last 24 hours) at 01/28/2019 1540 Last data filed at 01/28/2019 1400 Gross per 24 hour  Intake 1681.87 ml  Output 1602 ml  Net 79.87 ml   Filed Weights   01/12/19 0929 01/21/19 1215  Weight: 67.6 kg 71.9 kg    Examination:   General: deconditioned  Neurology: Awake and alert, non focal  E ENT:  no pallor, no icterus, oral mucosa moist, NG tube in place. Cardiovascular: No JVD. S1-S2 present, rhythmic, no gallops, rubs, or murmurs. No lower extremity edema.  Pulmonary: positive breath sounds bilaterally, adequate air movement, no wheezing, rhonchi or rales. Gastrointestinal. Abdomen mild distended, no organomegaly, non tender to superficial palpation, no rebound or guarding Skin. No rashes Musculoskeletal: no joint deformities     Data Reviewed: I have personally reviewed following labs and imaging studies  CBC: Recent Labs  Lab 01/22/19 0327 01/25/19 0424 01/27/19 0349  WBC 7.3 7.6 6.6  NEUTROABS 5.3 5.6 4.6  HGB 9.0* 9.5* 9.4*  HCT 29.9* 31.2* 31.9*  MCV 85.2 84.3 84.4  PLT 280 316 272   Basic Metabolic Panel: Recent Labs  Lab 01/22/19 0327 01/23/19 0419 01/24/19 0421 01/25/19 0424 01/26/19 0324 01/27/19 0349 01/28/19 0427  NA 138 135 134* 135 136 135 135  K 4.1 4.1 4.0 3.8 3.9 4.5 4.5  CL 105 103 101 101 102 103 103  CO2 25 25 26 25 26 23 24   GLUCOSE 131* 107* 98 115* 143* 113* 143*  BUN 34* 26* 25* 19 20 25* 29*  CREATININE 0.61 0.64 0.64 0.64 0.60 0.53 0.62  CALCIUM 8.1* 7.9* 7.7* 8.1* 8.1* 8.1* 8.2*  MG 2.2 2.0 2.1  --   --  2.2  --   PHOS  --  3.3  --   --   --  3.4  --    GFR: Estimated Creatinine Clearance: 45.5 mL/min (by C-G formula based on SCr of 0.62 mg/dL). Liver Function Tests: Recent Labs  Lab 01/23/19 0419 01/27/19 0349  AST 24 22  ALT 28 26  ALKPHOS 51 53  BILITOT 0.5 0.3  PROT 5.1* 5.6*  ALBUMIN 2.3* 2.6*   No results for input(s): LIPASE, AMYLASE in the last 168 hours. No results for input(s): AMMONIA in the last 168 hours. Coagulation Profile: No results for input(s): INR, PROTIME in the last 168 hours. Cardiac Enzymes: No results for input(s): CKTOTAL, CKMB, CKMBINDEX, TROPONINI in the last 168 hours. BNP (last 3 results) No results for input(s): PROBNP in the last 8760 hours. HbA1C: No results for input(s): HGBA1C in the last 72 hours. CBG: Recent Labs  Lab 01/27/19 1141 01/27/19 1805 01/28/19 0008 01/28/19 0604 01/28/19 1155  GLUCAP 163* 141* 154* 125* 147*   Lipid  Profile: Recent Labs    01/27/19 0349  TRIG 95   Thyroid Function Tests: No results for input(s): TSH, T4TOTAL, FREET4, T3FREE, THYROIDAB in the last 72 hours. Anemia Panel: No results for input(s): VITAMINB12, FOLATE, FERRITIN, TIBC, IRON, RETICCTPCT in the last 72 hours.    Radiology Studies: I have reviewed all of the imaging during this hospital visit personally     Scheduled Meds: . heparin  5,000 Units Subcutaneous Q8H  . insulin aspart  0-15 Units Subcutaneous Q6H  . latanoprost  1 drop Both Eyes QHS  . levothyroxine  25 mcg Intravenous Daily  . loratadine  10 mg Oral Daily  . pantoprazole (PROTONIX) IV  40 mg Intravenous Q24H  . sodium chloride flush  10-40 mL Intracatheter Q12H   Continuous Infusions: . lactated ringers 10 mL/hr at 01/27/19 0600  . TPN ADULT (ION) 75 mL/hr at 01/27/19 1841  . TPN ADULT (ION)       LOS: 16 days        Christie Hinton Gerome Apley, MD

## 2019-01-28 NOTE — Progress Notes (Signed)
PHARMACY - ADULT TOTAL PARENTERAL NUTRITION CONSULT NOTE   Pharmacy Consult for TPN Indication: Post-operative ileus following surgery for SBO  Patient Measurements: Height: 5' 6.5" (168.9 cm) Weight: 158 lb 9 oz (71.9 kg) IBW/kg (Calculated) : 60.45 TPN AdjBW (KG): 67.6 Body mass index is 25.21 kg/m.   Vitals:   01/28/19 0601 01/28/19 0722  BP: 108/63   Pulse: 85   Resp: 18   Temp: 98.7 F (37.1 C) 98.9 F (37.2 C)  SpO2: 95%     Intake/Output Summary (Last 24 hours) at 01/28/2019 1024 Last data filed at 01/28/2019 1000 Gross per 24 hour  Intake 2021.91 ml  Output 1453 ml  Net 568.91 ml    Recent Labs    01/27/19 0349 01/28/19 0427  NA 135 135  K 4.5 4.5  CL 103 103  CO2 23 24  GLUCOSE 113* 143*  BUN 25* 29*  CREATININE 0.53 0.62  CALCIUM 8.1* 8.2*  PHOS 3.4  --   MG 2.2  --   ALBUMIN 2.6*  --   ALKPHOS 53  --   AST 22  --   ALT 26  --   BILITOT 0.3  --   TRIG 95  --   PREALBUMIN 12.3*  --   Corr Ca 6/17 = 9.2  Current Nutrition: NPO, TPN (sips of clears) IVF: LR @ 15ml/hr Central access: PICC  TPN start date: 6/10  ASSESSMENT                                                                                        HPI: 85 y/o F with SBO underwent laparotomy and lysis of adhesions on 01/14/19, now on TPN. Hx of DM (diet-controlled) noted.  Significant events:  6/14: continue TPN, start CL soon and per CCS note may start to wean TPN soon if tolerated 6/16: started clear liquids 6/17: TPN reduced to 1/2 rate per surgery 6/18: +ileus, attempting to insert NGT but pt refusing.   6/20: Increase TPN back to full dose per surgery 6/22: repeat clamping trial but later developed nausea   Today:   Glucose (goal CBG<150)  Hx DM - diet controlled  CBGs mostly at goal (125 - 163)   Received 6 units sensitive SSI yesterday; 10 units regular insulin in TPN  Electrolytes - all WNL (K repleted yesterday)  Renal - SCr, bicarb, UOP WNL, BUN rising without  obvious source  I/O - >800 ml yesterday from NG  LFTs - stable WNL  TGs - stable WNL  Prealbumin - slightly worse 14.1 >> 12.3  NUTRITIONAL GOALS                                                                           KCal:    1750-2025 / day Protein: 80-95 g /day  Goal TPN rate is 75 ml/hr (provides 1854 KCal/day, 99 g protein/day, 1.8  L/day)  Keep Mg 2.0+, K 4.0+ with possible ileus  PLAN                                                                                                         At 6pm: Continue custom TPN at goal rate of 75 ml/hr.  10 units insulin in TPN  Standard electrolytes in TPN except increased Na and K; Cl:Ac = 1:1  TPN to contain standard multivitamins daily and trace elements MWF  Increase SSI to moderate scale with q6h CBG checks   IVF per MD  TPN lab panels on Mondays & Thursdays   Bernadene Personrew Annelyse Rey, PharmD, BCPS [phone] 01/28/2019, 10:24 AM

## 2019-01-29 LAB — BASIC METABOLIC PANEL
Anion gap: 8 (ref 5–15)
BUN: 31 mg/dL — ABNORMAL HIGH (ref 8–23)
CO2: 26 mmol/L (ref 22–32)
Calcium: 8.4 mg/dL — ABNORMAL LOW (ref 8.9–10.3)
Chloride: 103 mmol/L (ref 98–111)
Creatinine, Ser: 0.66 mg/dL (ref 0.44–1.00)
GFR calc Af Amer: >60 mL/min (ref >60)
GFR calc non Af Amer: >60 mL/min (ref >60)
Glucose, Bld: 129 mg/dL — ABNORMAL HIGH (ref 70–99)
Potassium: 4.6 mmol/L (ref 3.5–5.1)
Sodium: 137 mmol/L (ref 135–145)

## 2019-01-29 LAB — GLUCOSE, CAPILLARY
Glucose-Capillary: 138 mg/dL — ABNORMAL HIGH (ref 70–99)
Glucose-Capillary: 143 mg/dL — ABNORMAL HIGH (ref 70–99)
Glucose-Capillary: 147 mg/dL — ABNORMAL HIGH (ref 70–99)
Glucose-Capillary: 151 mg/dL — ABNORMAL HIGH (ref 70–99)

## 2019-01-29 LAB — CBC WITH DIFFERENTIAL/PLATELET
Abs Immature Granulocytes: 0.06 10*3/uL (ref 0.00–0.07)
Basophils Absolute: 0 10*3/uL (ref 0.0–0.1)
Basophils Relative: 1 %
Eosinophils Absolute: 0.1 10*3/uL (ref 0.0–0.5)
Eosinophils Relative: 1 %
HCT: 31.8 % — ABNORMAL LOW (ref 36.0–46.0)
Hemoglobin: 9.3 g/dL — ABNORMAL LOW (ref 12.0–15.0)
Immature Granulocytes: 1 %
Lymphocytes Relative: 20 %
Lymphs Abs: 1.3 10*3/uL (ref 0.7–4.0)
MCH: 25.1 pg — ABNORMAL LOW (ref 26.0–34.0)
MCHC: 29.2 g/dL — ABNORMAL LOW (ref 30.0–36.0)
MCV: 85.7 fL (ref 80.0–100.0)
Monocytes Absolute: 0.5 10*3/uL (ref 0.1–1.0)
Monocytes Relative: 8 %
Neutro Abs: 4.3 10*3/uL (ref 1.7–7.7)
Neutrophils Relative %: 69 %
Platelets: 318 10*3/uL (ref 150–400)
RBC: 3.71 MIL/uL — ABNORMAL LOW (ref 3.87–5.11)
RDW: 16.4 % — ABNORMAL HIGH (ref 11.5–15.5)
WBC: 6.3 10*3/uL (ref 4.0–10.5)
nRBC: 0 % (ref 0.0–0.2)

## 2019-01-29 MED ORDER — TRAVASOL 10 % IV SOLN
INTRAVENOUS | Status: AC
  Administered 2019-01-29: 19:00:00 via INTRAVENOUS
  Filled 2019-01-29: qty 990

## 2019-01-29 MED ORDER — POLYETHYLENE GLYCOL 3350 17 G PO PACK
17.0000 g | PACK | Freq: Every day | ORAL | Status: DC
  Administered 2019-01-30 – 2019-02-11 (×11): 17 g via ORAL
  Filled 2019-01-29 (×12): qty 1

## 2019-01-29 MED ORDER — DOCUSATE SODIUM 100 MG PO CAPS
100.0000 mg | ORAL_CAPSULE | Freq: Two times a day (BID) | ORAL | Status: DC
  Administered 2019-01-29 – 2019-02-11 (×22): 100 mg via ORAL
  Filled 2019-01-29 (×24): qty 1

## 2019-01-29 NOTE — Care Management Important Message (Signed)
Important Message  Patient Details IM Letter given to Sharren Bridge SW to present to the Patient Name: Christie Hinton MRN: [id number] Date of Birth: 1927/09/20   Medicare Important Message Given:  Yes     Kerin Salen 01/29/2019, 10:24 AM

## 2019-01-29 NOTE — Progress Notes (Signed)
NG tube clamped this morning. Patient tolerated this well. No abdominal pain or nausea. She has not required any medications for pain or nausea since start of clamping trial. Abdomen is soft, NT, ND, +BS. She reports she is still passing flatus. NG tube rehooked to wall suction with <100 cc residual. NG tube was pulled. Patient was started on CLD. Patient's son was called and updated.

## 2019-01-29 NOTE — Progress Notes (Signed)
PHARMACY - ADULT TOTAL PARENTERAL NUTRITION CONSULT NOTE   Pharmacy Consult for TPN Indication: Post-operative ileus following surgery for SBO  Patient Measurements: Height: 5' 6.5" (168.9 cm) Weight: 158 lb 9 oz (71.9 kg) IBW/kg (Calculated) : 60.45 TPN AdjBW (KG): 67.6 Body mass index is 25.21 kg/m.   Vitals:   01/28/19 2303 01/29/19 0430  BP: 112/69 123/70  Pulse: 80 82  Resp: 17 18  Temp: 98.5 F (36.9 C) 98.1 F (36.7 C)  SpO2: 95% 95%    Intake/Output Summary (Last 24 hours) at 01/29/2019 0867 Last data filed at 01/29/2019 0600 Gross per 24 hour  Intake 2029.61 ml  Output 610 ml  Net 1419.61 ml    Recent Labs    01/27/19 0349 01/28/19 0427 01/29/19 0410  NA 135 135 137  K 4.5 4.5 4.6  CL 103 103 103  CO2 23 24 26   GLUCOSE 113* 143* 129*  BUN 25* 29* 31*  CREATININE 0.53 0.62 0.66  CALCIUM 8.1* 8.2* 8.4*  PHOS 3.4  --   --   MG 2.2  --   --   ALBUMIN 2.6*  --   --   ALKPHOS 53  --   --   AST 22  --   --   ALT 26  --   --   BILITOT 0.3  --   --   TRIG 95  --   --   PREALBUMIN 12.3*  --   --   Corr Ca 6/17 = 9.2  Current Nutrition: NPO, TPN (sips of clears) IVF: LR @ 24ml/hr Central access: PICC  TPN start date: 6/10  ASSESSMENT                                                                                        HPI: 85 y/o F with SBO underwent laparotomy and lysis of adhesions on 01/14/19, now on TPN. Hx of DM (diet-controlled) noted.  Significant events:  6/14: continue TPN, start CL soon and per CCS note may start to wean TPN soon if tolerated 6/16: started clear liquids 6/17: TPN reduced to 1/2 rate per surgery 6/18: +ileus, attempting to insert NGT but pt refusing.   6/20: Increase TPN back to full dose per surgery 6/22: repeat clamping trial but later developed nausea   Today:   Glucose (goal CBG<150)  Hx DM - diet controlled  CBGs at goal (143-149)   Received 7 units sensitive SSI yesterday; 10 units regular insulin in  TPN  Electrolytes - all stable WNL  Renal - SCr, bicarb, UOP WNL, BUN rising without obvious source  I/O - decreased from yesterday  LFTs - stable WNL  TGs - stable WNL  Prealbumin - slightly worse 14.1 >> 12.3 (6/22)  NUTRITIONAL GOALS  KCal:    1610-9604:    1750-2025 / day Protein: 80-95 g /day  Goal TPN rate is 75 ml/hr (provides 1854 KCal/day, 99 g protein/day, 1.8 L/day)  Keep Mg 2.0+, K 4.0+ with possible ileus  PLAN                                                                                                         At 6pm: Continue custom TPN at goal rate of 75 ml/hr.  10 units insulin in TPN  Standard electrolytes in TPN except increased Na and K (no changes from yesterday; Cl:Ac = 1:1  TPN to contain standard multivitamins daily and trace elements MWF  Continue moderate SSI with q6h CBG checks   IVF per MD  TPN lab panels on Mondays & Thursdays   Bernadene Personrew Ollen Rao, PharmD, BCPS [phone] 01/29/2019, 6:38 AM

## 2019-01-29 NOTE — Progress Notes (Signed)
15 Days Post-Op  Subjective: CC:  Doing well this morning. Some LUQ abdominal pain yesterday but reports this resolved last night. No abdominal pain this morning. Denies N/V. Passing some flatus. Last BM was 01/26/2019.    Objective: Vital signs in last 24 hours: Temp:  [98.1 F (36.7 C)-98.5 F (36.9 C)] 98.1 F (36.7 C) (06/24 0430) Pulse Rate:  [79-82] 82 (06/24 0430) Resp:  [16-18] 18 (06/24 0430) BP: (107-123)/(63-70) 123/70 (06/24 0430) SpO2:  [94 %-95 %] 95 % (06/24 0430) Last BM Date: 01/26/19  Intake/Output from previous day: 06/23 0701 - 06/24 0700 In: 2029.6 [I.V.:2029.6] Out: 310 [Emesis/NG output:310] Intake/Output this shift: No intake/output data recorded.  PE: Gen: Awake and alert, NAD Lungs: Normal effort and rate Abd: Soft, ND, NT, +BS. Midline wound c/d/i with steri-strips over. NG tube in place on LIWS. NG tube 310cc/24 hours.  Msk; No edema   Lab Results:  Recent Labs    01/27/19 0349 01/29/19 0410  WBC 6.6 6.3  HGB 9.4* 9.3*  HCT 31.9* 31.8*  PLT 336 318   BMET Recent Labs    01/28/19 0427 01/29/19 0410  NA 135 137  K 4.5 4.6  CL 103 103  CO2 24 26  GLUCOSE 143* 129*  BUN 29* 31*  CREATININE 0.62 0.66  CALCIUM 8.2* 8.4*   PT/INR No results for input(s): LABPROT, INR in the last 72 hours. CMP     Component Value Date/Time   NA 137 01/29/2019 0410   K 4.6 01/29/2019 0410   CL 103 01/29/2019 0410   CO2 26 01/29/2019 0410   GLUCOSE 129 (H) 01/29/2019 0410   BUN 31 (H) 01/29/2019 0410   CREATININE 0.66 01/29/2019 0410   CALCIUM 8.4 (L) 01/29/2019 0410   PROT 5.6 (L) 01/27/2019 0349   ALBUMIN 2.6 (L) 01/27/2019 0349   AST 22 01/27/2019 0349   ALT 26 01/27/2019 0349   ALKPHOS 53 01/27/2019 0349   BILITOT 0.3 01/27/2019 0349   GFRNONAA >60 01/29/2019 0410   GFRAA >60 01/29/2019 0410   Lipase     Component Value Date/Time   LIPASE 29 01/12/2019 0928       Studies/Results: Dg Abd Portable 1v  Result Date:  01/28/2019 CLINICAL DATA:  Ileus follow-up. EXAM: PORTABLE ABDOMEN - 1 VIEW COMPARISON:  Abdominal x-ray dated January 25, 2019. FINDINGS: Unchanged enteric tube terminating in the distal esophagus. Persistent diffuse small bowel dilatation up to 5.7 cm, minimally improved. No radio-opaque calculi or other significant radiographic abnormality are seen. IMPRESSION: 1. Persistent ileus, minimally improved. 2. Unchanged enteric tube with the tip in the distal esophagus. Recommend advancement. Electronically Signed   By: Obie DredgeWilliam T Derry M.D.   On: 01/28/2019 15:35    Anti-infectives: Anti-infectives (From admission, onward)   Start     Dose/Rate Route Frequency Ordered Stop   01/14/19 1103  sodium chloride 0.9 % with cefoTEtan (CEFOTAN) ADS Med    Note to Pharmacy: Vevelyn RoyalsHarvell, Gwendolyn  : cabinet override      01/14/19 1103 01/14/19 1124   01/14/19 1045  cefoTEtan (CEFOTAN) 2 g in sodium chloride 0.9 % 100 mL IVPB     2 g 200 mL/hr over 30 Minutes Intravenous  Once 01/14/19 1044 01/14/19 1124       Assessment/Plan Thyroid disease HTN HLD DM - above per medicine team Protein Calorie Malnutrition- Pre-albumin 8.4 (6/10)>>14.1(6/15)>>12.3(6/22)  SBO S/p Ex Lap with LOA for SBO due to Omental Adhesion,internal hernia - Dr. Derrell Lollingamirez - 01/14/2019 - POD #  63 - Mobilize for bowel function - Pulm toliet - Keep K>4 and Mg >2 for bowel function - Clamping trial. Recheck later today. Sips of clears from the floor. Hopefully will be able to d/c NG later today. If d/c'd will need to be started on bowel regimen. Takes miralax and milk of mag at home daily.   FEN - TPN, sips of clears from the floor  VTE - Heparin, SCDs, Mobilize  ID - Cefotetan periop. None currently. Afebrile.WBC 6,300 (6/24) Foley - None  Follow up- Dr. Rosendo Gros  POC - Son - Ed Bly [phone]) - I spoke with him this morning.  - Patient's son and daughter plan to be with her when she returns home. Daughter during the  day and son at night.      LOS: 17 days    Jillyn Ledger , [facility] 01/29/2019, 9:01 AM Pager: [phone]

## 2019-01-29 NOTE — Progress Notes (Signed)
Physical Therapy Treatment Patient Details Name: Christie ChampionMarian Hinton MRN: [id number] DOB: 09-28-1927 Today's Date: 01/29/2019    History of Present Illness 85 yo female admitted with SBO. S/P lysis of adhesions 01/14/19. Hx of DM    PT Comments    Pt reports feeling better today and ambulated around unit with rollator.  Pt also performed standing exercises at sink counter for support.     Follow Up Recommendations  Home health PT;Supervision - Intermittent     Equipment Recommendations  Other (comment)(rollator is possible, pt reports she has RW at home)    Recommendations for Other Services       Precautions / Restrictions Precautions Precautions: Fall    Mobility  Bed Mobility Overal bed mobility: Needs Assistance Bed Mobility: Supine to Sit;Sit to Supine     Supine to sit: Supervision;HOB elevated Sit to supine: Supervision   General bed mobility comments: supervision for safety and lines  Transfers Overall transfer level: Needs assistance Equipment used: 4-wheeled walker Transfers: Sit to/from Stand Sit to Stand: Min guard         General transfer comment: min/guard for safety  Ambulation/Gait Ambulation/Gait assistance: Min guard Gait Distance (Feet): 500 Feet Assistive device: 4-wheeled walker Gait Pattern/deviations: Step-through pattern     General Gait Details: cues for safe use of rollator, improved pace today compared to last visit   Stairs             Wheelchair Mobility    Modified Rankin (Stroke Patients Only)       Balance                                            Cognition Arousal/Alertness: Awake/alert Behavior During Therapy: WFL for tasks assessed/performed Overall Cognitive Status: Within Functional Limits for tasks assessed                                        Exercises General Exercises - Lower Extremity Hip ABduction/ADduction: AROM;10 reps;Both;Standing Hip Flexion/Marching:  AROM;10 reps;Both;Standing Heel Raises: AROM;Both;Standing;10 reps Mini-Sqauts: AROM;Both;10 reps;Other (comment)(sit to stands from bed with no UE assist)    General Comments        Pertinent Vitals/Pain Pain Assessment: No/denies pain    Home Living                      Prior Function            PT Goals (current goals can now be found in the care plan section) Progress towards PT goals: Progressing toward goals    Frequency    Min 3X/week      PT Plan Current plan remains appropriate    Co-evaluation              AM-PAC PT "6 Clicks" Mobility   Outcome Measure  Help needed turning from your back to your side while in a flat bed without using bedrails?: A Little Help needed moving from lying on your back to sitting on the side of a flat bed without using bedrails?: A Little Help needed moving to and from a bed to a chair (including a wheelchair)?: A Little Help needed standing up from a chair using your arms (e.g., wheelchair or bedside chair)?: A Little Help needed to walk in hospital  room?: A Little Help needed climbing 3-5 steps with a railing? : A Little 6 Click Score: 18    End of Session   Activity Tolerance: Patient tolerated treatment well Patient left: in bed;with call bell/phone within reach   PT Visit Diagnosis: Unsteadiness on feet (R26.81);Muscle weakness (generalized) (M62.81)     Time: 4098-1191 PT Time Calculation (min) (ACUTE ONLY): 16 min  Charges:  $Gait Training: 8-22 mins                     Carmelia Bake, PT, [place] Office: [phone] Pager: [phone]  Trena Platt 01/29/2019, 2:56 PM

## 2019-01-29 NOTE — Progress Notes (Signed)
PROGRESS NOTE    Christie Hinton  ZOX:[id number] DOB: [birth year] DOA: 01/12/2019 PCP: Sharren Bridge, NP   Brief Narrative:  85 year old female who presented abdominal pain, nausea or vomiting. She does have significant past medical history for hypertension, dyslipidemia and GERD. She reported generalized abdominal pain after a meal, associated with vomiting. She presented to the hospital due to progressive abdominal pain. On her initial physical examination her temperature was 97.7, heart rate 76, blood pressure 166/82, oxygen saturation 99%. Her lungs are clear to auscultation bilaterally, heart S1-S2 present rhythmic, the abdomen was soft, generalized abdominal pain to palpation, hypoactive bowel sounds, no lower extremity edema. CT of the abdomen with a small bowel obstruction, dilated loops of small bowel in the left abdomen with mesenteric edema and a well-defined transition point.Findings concerning for closed-loop obstruction, possibly related to an internal hernia.  Patient was admitted to the hospital working diagnosis of small bowel obstruction.  Underwent exploratory laparotomy on 01/14/19, lysis of adhesions. Complicated with post operative ileus.   Very slow to recover ileuos, required NG tube to low intermittent suction.   Assessment & Plan:   Principal Problem:   SBO (small bowel obstruction) s/p lysis of adhesions 01/14/2019 Active Problems:   Essential hypertension   Mixed hyperlipidemia   1. Small bowel obstructionsp exploratory laparotomy 01/14/19, with post operative ileus. Has been difficult to wean patient from NG tube.  Had nausea yesterday with significant output after resumption of wall suction.  We will try again today.,  Surgery following, agree with plan, continue analgesics   2. Hypokalemia, hypophosphatemia. Corrected electrolytes with K at 4,6 and preserved renal function, cr at 0,66.  3. HTN.Blood pressure is controlled, systolic 113 mmHg,  will continue to hold on antihypertensive agents.   4. Hypothyroid.On IVlevothyroxine, change to po when patient tolerating po route.  No evidence of obvious hormonal dysregulation  5. Dyslipidemia.Statin therapy.   6. T2DM. Continue with sliding scale insulin check and blood glucose AC at bedtime,  DVT prophylaxis: Lovenox SQ  Code Status: Full    Code Status Orders  (From admission, onward)         Start     Ordered   01/12/19 1639  Full code  Continuous     01/12/19 1639        Code Status History    This patient has a current code status but no historical code status.   Advance Care Planning Activity     Family Communication: .[phone], son Eddie, N/A, LM  Disposition Plan:   Patient remained inpatient postop day #15 status post ex lap with lysis of adhesions for small bowel obstruction.  Patient very slow to recover and mobilized bowel function.  Will require continued NG tube with trials of clamping.  Advance diet as tolerated.  Patient only be discharged home until she is able to have an NG tube removed and tolerated diet.  Without these treatments patient did have a clinical deterioration that could be life-threatening. Consults called: GEN SURG Admission status: Inpatient   Consultants:   GEN SURG  Procedures:  Dg Abd 1 View  Result Date: 01/16/2019 CLINICAL DATA:  Abdominal distension EXAM: ABDOMEN - 1 VIEW COMPARISON:  01/15/2019 FINDINGS: Persistent small bowel dilatation is noted consistent with at least partial small bowel obstruction. Contrast is noted within the right colon administered on prior CT examination. Postsurgical changes are seen. IMPRESSION: Stable small bowel dilatation. This may be related to postoperative ileus although persistent small-bowel obstruction deserves consideration  as well. Correlation with the physical exam is recommended. Electronically Signed   By: Alcide CleverMark  Lukens M.D.   On: 01/16/2019 10:21   Dg Abd 1 View  Result  Date: 01/15/2019 CLINICAL DATA:  Small-bowel obstruction EXAM: ABDOMEN - 1 VIEW COMPARISON:  01/14/2019 FINDINGS: Persistent markedly dilated small intestine consistent with ongoing small bowel obstruction. Collapsed colon containing some contrast. IMPRESSION: Persistent small bowel obstruction pattern. No visible nasogastric tube. Electronically Signed   By: Paulina FusiMark  Shogry M.D.   On: 01/15/2019 11:20   X-ray Abdomen Ap  Result Date: 01/14/2019 CLINICAL DATA:  Nasogastric tube placement. EXAM: ABDOMEN - 1 VIEW COMPARISON:  Radiograph of same day. FINDINGS: Slightly decreased small bowel dilatation is noted. These findings remain suspicious for distal small bowel obstruction. Midline surgical staples are noted. Nasogastric tube is seen looped within probable hiatal hernia above the diaphragm. IMPRESSION: Nasogastric tube tip appears to be looped within the hiatal hernia above the diaphragm. Slightly decreased small bowel dilatation is noted as described above. Electronically Signed   By: Lupita RaiderJames  Green Jr M.D.   On: 01/14/2019 14:40   X-ray Abdomen Ap  Result Date: 01/14/2019 CLINICAL DATA:  Nasogastric tube placement. EXAM: ABDOMEN - 1 VIEW COMPARISON:  Radiograph of January 14, 2019. FINDINGS: Stable severe small bowel dilatation is noted consistent with distal small bowel obstruction. Midline surgical staples are noted. No abnormal calcifications are noted. Nasogastric tube appears to be going into the right mainstem and lower lobe bronchus. IMPRESSION: Nasogastric tube is seen going into right mainstem and lower lobe bronchus. Immediate withdrawal is recommended. Critical Value/emergent results were called by telephone at the time of interpretation on 01/14/2019 at 1:42 pm toAshura, who verbally acknowledged these results and stated that they have already removed the nasogastric tube and replaced it. Stable small bowel dilatation is noted consistent with distal small bowel obstruction. Electronically Signed   By:  Lupita RaiderJames  Green Jr M.D.   On: 01/14/2019 13:43   Ct Abdomen Pelvis W Contrast  Result Date: 01/12/2019 CLINICAL DATA:  85 year old with bowel obstruction. Acute lower abdominal pain with nausea. EXAM: CT ABDOMEN AND PELVIS WITH CONTRAST TECHNIQUE: Multidetector CT imaging of the abdomen and pelvis was performed using the standard protocol following bolus administration of intravenous contrast. CONTRAST:  100mL OMNIPAQUE IOHEXOL 300 MG/ML  SOLN COMPARISON:  None. FINDINGS: Lower chest: Lung bases are clear.  No pleural effusions. Hepatobiliary: Normal appearance of the liver. Small hypodensity in the right hepatic lobe on image 19 is probably an incidental finding. Portal venous system is patent. Normal appearance of the gallbladder without inflammatory changes. No biliary dilatation. Pancreas: Unremarkable. No pancreatic ductal dilatation or surrounding inflammatory changes. Spleen: Normal in size without focal abnormality. Adrenals/Urinary Tract: Normal adrenal glands. Urinary bladder is unremarkable but slightly limited evaluation due to artifact from left hip replacement. Normal appearance of both kidneys without hydronephrosis. Stomach/Bowel: Extensive diverticulosis in the sigmoid colon and left colon without acute colonic inflammation. Normal appendix. Moderate sized hiatal hernia. Normal appearance of the duodenum. Dilated small bowel loops in the left abdomen compatible with jejunum. There is mesenteric edema associated with these dilated small bowel loops. Small bowel loops measure up to 3.3 cm. Distal small bowel is decompressed. Small bowel transition point in the left abdomen near the mesenteric edema. Transition point best seen on the coronal reformats, sequence 5 image 42 and sagittal reformats, sequence 6, image 84. Irregularity of the mesentery and fat near the bowel obstruction and raises concern for a closed loop obstruction and  possibly an internal hernia. No evidence for pneumatosis or portal  air. Vascular/Lymphatic: Atherosclerotic disease in the abdominal aorta without aneurysm. No abdominopelvic lymphadenopathy. Reproductive: Uterus and bilateral adnexa are unremarkable. Other: Small amount of free fluid in the pelvis. Minimal fluid in the left paracolic gutter. Mesenteric edema in the left abdomen associated with the dilated small bowel loops. Negative for free air. Musculoskeletal: No acute bone abnormality in the spine. Extensive facet arthropathy. IMPRESSION: 1. Small bowel obstruction. Dilated loops of small bowel in the left abdomen with mesenteric edema and well-defined transition point. Findings raise concern for a closed loop obstruction, possibly related to an internal hernia. Small amount of fluid in the abdomen and pelvis. 2. Colonic diverticulosis without acute colonic inflammation. 3. Moderate sized hiatal hernia. These results were called by telephone at the time of interpretation on 01/12/2019 at 12:08 pm to Dr. Alvira Monday , who verbally acknowledged these results. Electronically Signed   By: Richarda Overlie M.D.   On: 01/12/2019 12:16   Dg Abd 2 Views  Result Date: 01/19/2019 CLINICAL DATA:  Recent exploratory laparotomy with lysis of adhesions for small-bowel obstruction. Abdominal distension. EXAM: ABDOMEN - 2 VIEW COMPARISON:  01/16/2019 FINDINGS: The enteric tube has been removed. Postsurgical changes are again noted with skin staples remaining in place. Moderate to severe diffuse small bowel dilatation is similar to the prior study. Gas is present in nondilated colon. Residual oral contrast material has migrated distally and is now located in the descending and sigmoid colon. No intraperitoneal free air is identified. Prior left hip arthroplasty is noted. IMPRESSION: Unchanged small bowel dilatation concerning for obstruction. Electronically Signed   By: Sebastian Ache M.D.   On: 01/19/2019 14:25   Dg Abd Portable 1v  Result Date: 01/28/2019 CLINICAL DATA:  Ileus follow-up.  EXAM: PORTABLE ABDOMEN - 1 VIEW COMPARISON:  Abdominal x-ray dated January 25, 2019. FINDINGS: Unchanged enteric tube terminating in the distal esophagus. Persistent diffuse small bowel dilatation up to 5.7 cm, minimally improved. No radio-opaque calculi or other significant radiographic abnormality are seen. IMPRESSION: 1. Persistent ileus, minimally improved. 2. Unchanged enteric tube with the tip in the distal esophagus. Recommend advancement. Electronically Signed   By: Obie Dredge M.D.   On: 01/28/2019 15:35   Dg Abd Portable 1v  Result Date: 01/25/2019 CLINICAL DATA:  Ileus EXAM: PORTABLE ABDOMEN - 1 VIEW COMPARISON:  01/24/2019 abdominal radiograph FINDINGS: Enteric tube terminates at the esophagogastric junction. Vertical skin staples overlie the medial left abdomen. Partially visualized left total hip arthroplasty. Prominent diffuse small bowel dilatation up to the 6.1 cm diameter, minimally improved. No evidence of pneumatosis or pneumoperitoneum. Minimal colonic stool. No radiopaque nephrolithiasis. IMPRESSION: 1. Enteric tube terminates at the esophagogastric junction. 2. Prominent diffuse small bowel dilatation, minimally improved, compatible with severe postoperative adynamic ileus. Electronically Signed   By: Delbert Phenix M.D.   On: 01/25/2019 07:09   Dg Abd Portable 1v  Result Date: 01/24/2019 CLINICAL DATA:  Vomiting.  Abdominal pain. EXAM: PORTABLE ABDOMEN - 1 VIEW COMPARISON:  01/23/2019.  01/19/2019. FINDINGS: Surgical staples noted over the abdomen. Persistent prominently dilated small bowel loops are again noted. Small-bowel distention may have increased from prior exam. Paucity of intra colonic air. No free air. Degenerative changes lumbar spine right hip. Total left hip replacement. IMPRESSION: Persistent prominent dilated small bowel loops are again noted. Small-bowel distention may have progressed from prior exam. Findings consistent small bowel obstruction. Electronically Signed    By: Maisie Fus  Register   On: 01/24/2019  07:16   Dg Abd Portable 1v  Result Date: 01/23/2019 CLINICAL DATA:  Vomiting, ileus postop EXAM: PORTABLE ABDOMEN - 1 VIEW COMPARISON:  01/19/2019 FINDINGS: Marked gaseous distention of predominantly small bowel loops. Gas noted within nondistended stomach and large bowel. Appearance is concerning for small bowel obstruction. No visible free air organomegaly. IMPRESSION: Continued marked dilatation of small bowel loops concerning for persistent small bowel obstruction. Electronically Signed   By: Charlett NoseKevin  Dover M.D.   On: 01/23/2019 19:50   Dg Abd Portable 1v  Result Date: 01/23/2019 CLINICAL DATA:  NG placement EXAM: PORTABLE ABDOMEN - 1 VIEW COMPARISON:  01/23/2019 FINDINGS: NG tip within hiatal hernia.  NG above the diaphragm. Dilated small bowel loops with mild improvement since earlier today. Skin staples in the midline of the abdomen. IMPRESSION: NG tip coiled in hiatal hernia.  Decrease small bowel dilatation. Electronically Signed   By: Marlan Palauharles  Clark M.D.   On: 01/23/2019 18:36   Dg Abd Portable 1v  Result Date: 01/14/2019 CLINICAL DATA:  Small-bowel obstruction EXAM: PORTABLE ABDOMEN - 1 VIEW COMPARISON:  01/13/2019 FINDINGS: Gastric catheter is noted within the stomach. Persistent and increased small-bowel dilatation is noted when compare with the previous day. Some colonic gas is noted and stable. No free air is seen. IMPRESSION: Increasing small bowel dilatation when compared with the previous day Electronically Signed   By: Alcide CleverMark  Lukens M.D.   On: 01/14/2019 08:46   Dg Abd Portable 1v-small Bowel Obstruction Protocol-initial, 8 Hr Delay  Result Date: 01/13/2019 CLINICAL DATA:  Small-bowel obstruction. EXAM: PORTABLE ABDOMEN - 1 VIEW COMPARISON:  01/13/2019 FINDINGS: The enteric tube projects over the gastric body. The patient's oral contrast is not well appreciated on this exam and is favored to remain within the small bowel. There are multiple dilated  loops of small bowel measuring up to approximately 4.5 cm. There is no definite pneumatosis or free air. There is a moderate amount of stool in the ascending colon. The bladder appears to be partially distended with contrast. IMPRESSION: 1. Oral contrast is not well appreciated on this exam but is favored to reside within the small bowel. No definite oral contrast visualized in the colon. 2. Persistently dilated loops of small bowel measuring up to approximately 4.5 cm in diameter. 3. Enteric tube projects over the left upper quadrant. Electronically Signed   By: Katherine Mantlehristopher  Green M.D.   On: 01/13/2019 20:00   Dg Abd Portable 1v-small Bowel Obstruction Protocol-initial, 8 Hr Delay  Result Date: 01/13/2019 CLINICAL DATA:  Small bowel obstruction. EXAM: PORTABLE ABDOMEN - 1 VIEW COMPARISON:  One-view abdomen 01/12/2019 FINDINGS: NG tube remains in place. Dilated loops of small bowel are slightly less prominent than on the prior study. Gas is present throughout the colon. Contrast is noted within the urinary bladder. Left hip replacement is evident. IMPRESSION: 1. Improving small bowel obstruction with NG tube in place. Electronically Signed   By: Marin Robertshristopher  Mattern M.D.   On: 01/13/2019 07:42   Dg Abd Portable 1v-small Bowel Protocol-position Verification  Result Date: 01/12/2019 CLINICAL DATA:  85 year old female with history of nasogastric tube placement. Evaluate for bowel obstruction. EXAM: PORTABLE ABDOMEN - 1 VIEW COMPARISON:  CT the abdomen and pelvis 01/12/2019. FINDINGS: Multiple prominent mildly dilated loops of gas-filled small bowel in the central abdomen measuring up to 3.4 cm in diameter. A small amount of gas and stool is noted in the colon. No pneumoperitoneum on this single supine view. Nasogastric tube extends into the proximal body of the stomach.  Either native contrast filling the urinary bladder, related to recent contrast enhanced CT examination. Status post left hip arthroplasty.  IMPRESSION: 1. Tip of nasogastric tube is in the proximal body of the stomach. 2. Bowel gas pattern again suggests early or partial small bowel obstruction. Electronically Signed   By: Trudie Reed M.D.   On: 01/12/2019 19:09   Dg Vangie Bicker G Tube Plc W/fl W/rad  Result Date: 01/24/2019 CLINICAL DATA:  Small-bowel distention.  NG tube for decompression. EXAM: NASO G TUBE PLACEMENT WITH FL AND WITH RAD CONTRAST:  None. FLUOROSCOPY TIME:  Fluoroscopy Time:  0 minutes and 48 seconds. Radiation Exposure Index (if provided by the fluoroscopic device): 16.7 mGy Number of Acquired Spot Images: 0 COMPARISON:  Abdomen film from 01/24/2020. Prior NG tube placement 01/15/2019. FINDINGS: NG tube was placed via the patient's right nostril and easily directed into the patient's known moderate to large hiatal hernia. Multiple attempts were made to direct the NG tube through the hernia into the subdiaphragmatic portion of the stomach, but these were unsuccessful. IMPRESSION: Successful NG tube placement into the supradiaphragmatic portion of the stomach in this patient with moderate to large hiatal hernia. As before, the tube could not be directed into the infra diaphragmatic portion of the stomach due to orientation of the hiatal hernia. I discussed this study with the patient's nurse Beth, at 1451 hours on 01/24/2019. Electronically Signed   By: Kennith Center M.D.   On: 01/24/2019 14:46   Dg Intro Long Gi Tube  Result Date: 01/15/2019 CLINICAL DATA:  Small bowel obstruction.  Recent lysis of adhesions. EXAM: INTRO LONG GI TUBE CONTRAST:  50mL OMNIPAQUE IOHEXOL 300 MG/ML  SOLN FLUOROSCOPY TIME:  Fluoroscopy Time:  12 Radiation Exposure Index (if provided by the fluoroscopic device): 203.4 mGy Number of Acquired Spot Images: 1 COMPARISON:  CT 01/12/2019 FINDINGS: The patient arrived with new G-tube in the distal esophagus. Patient has a large hiatal hernia. Multiple attempts were made to pass the NG tube through the hiatal  hernia. Contrast and air were administered into the hernia/stomach. A guidewire was employed. Patient was repositioned in multiple position. Ultimately, the tip of the NG tube could not be passed into the stomach due to the orientation of the orifice of the hiatal hernia. The gastric contents and administer contrast were aspirated easily back out of the hernia sac and stomach. IMPRESSION: NG tube position within the large hiatal hernia. Tube could not be passed into the proximal stomach due to the anatomy of the hernia. The tube will likely decompress the stomach in the current position. Findings conveyed topatient's floor nurse Tracyon 01/15/2019 at15:29. Electronically Signed   By: Genevive Bi M.D.   On: 01/15/2019 16:30   [place] Ekg Site Rite  Result Date: 01/14/2019 If Site Rite image not attached, placement could not be confirmed due to current cardiac rhythm.    Antimicrobials:   NONE   Subjective: Patient did report some nausea yesterday with clamping of NG tube Currently stable anticipate clamping NG tube today, no current complaints of nausea or vomiting  Objective: Vitals:   01/28/19 1418 01/28/19 2303 01/29/19 0430 01/29/19 1406  BP: 107/63 112/69 123/70 113/69  Pulse: 79 80 82 79  Resp: Temp: 98.5 F (36.9 C) 98.5 F (36.9 C) 98.1 F (36.7 C) 98.4 F (36.9 C)  TempSrc: Oral Oral Oral Oral  SpO2: 94% 95% 95% 96%  Weight:      Height:  Intake/Output Summary (Last 24 hours) at 01/29/2019 1422 Last data filed at 01/29/2019 1400 Gross per 24 hour  Intake 1812.43 ml  Output 660 ml  Net 1152.43 ml   Filed Weights   01/12/19 0929 01/21/19 1215  Weight: 67.6 kg 71.9 kg    Examination:  General exam: Appears calm and comfortable NG tube in place Respiratory system: Clear to auscultation. Respiratory effort normal. Cardiovascular system: S1 & S2 heard, RRR. No JVD, murmurs, rubs, gallops or clicks. No pedal edema. Gastrointestinal system: Quiet  bowel sounds, soft mildly tender to palpation diffusely no focal findings, no rebound no guarding not rigid Central nervous system: Alert and oriented. No focal neurological deficits. Extremities: Moves all 4 extremities freely no focal neurological deficits Skin: No rashes, lesions or ulcers Psychiatry: Judgement and insight appear normal. Mood & affect appropriate.     Data Reviewed: I have personally reviewed following labs and imaging studies  CBC: Recent Labs  Lab 01/25/19 0424 01/27/19 0349 01/29/19 0410  WBC 7.6 6.6 6.3  NEUTROABS 5.6 4.6 4.3  HGB 9.5* 9.4* 9.3*  HCT 31.2* 31.9* 31.8*  MCV 84.3 84.4 85.7  PLT 316 336 030   Basic Metabolic Panel: Recent Labs  Lab 01/23/19 0419 01/24/19 0421 01/25/19 0424 01/26/19 0324 01/27/19 0349 01/28/19 0427 01/29/19 0410  NA 135 134* 135 136 135 135 137  K 4.1 4.0 3.8 3.9 4.5 4.5 4.6  CL 103 101 101 102 103 103 103  CO2 25 26 25 26 23 24 26   GLUCOSE 107* 98 115* 143* 113* 143* 129*  BUN 26* 25* 19 20 25* 29* 31*  CREATININE 0.64 0.64 0.64 0.60 0.53 0.62 0.66  CALCIUM 7.9* 7.7* 8.1* 8.1* 8.1* 8.2* 8.4*  MG 2.0 2.1  --   --  2.2  --   --   PHOS 3.3  --   --   --  3.4  --   --    GFR: Estimated Creatinine Clearance: 45.5 mL/min (by C-G formula based on SCr of 0.66 mg/dL). Liver Function Tests: Recent Labs  Lab 01/23/19 0419 01/27/19 0349  AST 24 22  ALT 28 26  ALKPHOS 51 53  BILITOT 0.5 0.3  PROT 5.1* 5.6*  ALBUMIN 2.3* 2.6*   No results for input(s): LIPASE, AMYLASE in the last 168 hours. No results for input(s): AMMONIA in the last 168 hours. Coagulation Profile: No results for input(s): INR, PROTIME in the last 168 hours. Cardiac Enzymes: No results for input(s): CKTOTAL, CKMB, CKMBINDEX, TROPONINI in the last 168 hours. BNP (last 3 results) No results for input(s): PROBNP in the last 8760 hours. HbA1C: No results for input(s): HGBA1C in the last 72 hours. CBG: Recent Labs  Lab 01/28/19 1155  01/28/19 1805 01/28/19 2349 01/29/19 0604 01/29/19 1132  GLUCAP 147* 145* 149* 143* 151*   Lipid Profile: Recent Labs    01/27/19 0349  TRIG 95   Thyroid Function Tests: No results for input(s): TSH, T4TOTAL, FREET4, T3FREE, THYROIDAB in the last 72 hours. Anemia Panel: No results for input(s): VITAMINB12, FOLATE, FERRITIN, TIBC, IRON, RETICCTPCT in the last 72 hours. Sepsis Labs: No results for input(s): PROCALCITON, LATICACIDVEN in the last 168 hours.  No results found for this or any previous visit (from the past 240 hour(s)).       Radiology Studies: Dg Abd Portable 1v  Result Date: 01/28/2019 CLINICAL DATA:  Ileus follow-up. EXAM: PORTABLE ABDOMEN - 1 VIEW COMPARISON:  Abdominal x-ray dated January 25, 2019. FINDINGS: Unchanged enteric tube terminating  in the distal esophagus. Persistent diffuse small bowel dilatation up to 5.7 cm, minimally improved. No radio-opaque calculi or other significant radiographic abnormality are seen. IMPRESSION: 1. Persistent ileus, minimally improved. 2. Unchanged enteric tube with the tip in the distal esophagus. Recommend advancement. Electronically Signed   By: Obie DredgeWilliam T Derry M.D.   On: 01/28/2019 15:35        Scheduled Meds:  heparin  5,000 Units Subcutaneous Q8H   insulin aspart  0-15 Units Subcutaneous Q6H   latanoprost  1 drop Both Eyes QHS   levothyroxine  25 mcg Intravenous Daily   loratadine  10 mg Oral Daily   pantoprazole (PROTONIX) IV  40 mg Intravenous Q24H   sodium chloride flush  10-40 mL Intracatheter Q12H   Continuous Infusions:  lactated ringers 10 mL/hr at 01/29/19 1135   TPN ADULT (ION) 75 mL/hr at 01/28/19 1742   TPN ADULT (ION)       LOS: 17 days    Time spent: 1635 MIN     Burke Keelshristopher Alnisa Hasley, MD [facility]  If 7PM-7AM, please contact night-coverage  01/29/2019, 2:22 PM

## 2019-01-30 LAB — COMPREHENSIVE METABOLIC PANEL
ALT: 29 U/L (ref 0–44)
AST: 24 U/L (ref 15–41)
Albumin: 2.6 g/dL — ABNORMAL LOW (ref 3.5–5.0)
Alkaline Phosphatase: 61 U/L (ref 38–126)
Anion gap: 9 (ref 5–15)
BUN: 30 mg/dL — ABNORMAL HIGH (ref 8–23)
CO2: 25 mmol/L (ref 22–32)
Calcium: 8.3 mg/dL — ABNORMAL LOW (ref 8.9–10.3)
Chloride: 101 mmol/L (ref 98–111)
Creatinine, Ser: 0.6 mg/dL (ref 0.44–1.00)
GFR calc Af Amer: >60 mL/min (ref >60)
GFR calc non Af Amer: >60 mL/min (ref >60)
Glucose, Bld: 162 mg/dL — ABNORMAL HIGH (ref 70–99)
Potassium: 4.6 mmol/L (ref 3.5–5.1)
Sodium: 135 mmol/L (ref 135–145)
Total Bilirubin: <0.1 mg/dL — ABNORMAL LOW (ref 0.3–1.2)
Total Protein: 5.5 g/dL — ABNORMAL LOW (ref 6.5–8.1)

## 2019-01-30 LAB — GLUCOSE, CAPILLARY
Glucose-Capillary: 143 mg/dL — ABNORMAL HIGH (ref 70–99)
Glucose-Capillary: 147 mg/dL — ABNORMAL HIGH (ref 70–99)
Glucose-Capillary: 162 mg/dL — ABNORMAL HIGH (ref 70–99)
Glucose-Capillary: 169 mg/dL — ABNORMAL HIGH (ref 70–99)

## 2019-01-30 LAB — MAGNESIUM: Magnesium: 2.1 mg/dL (ref 1.7–2.4)

## 2019-01-30 LAB — PHOSPHORUS: Phosphorus: 3.3 mg/dL (ref 2.5–4.6)

## 2019-01-30 MED ORDER — TRAVASOL 10 % IV SOLN
INTRAVENOUS | Status: AC
  Administered 2019-01-30: 17:00:00 via INTRAVENOUS
  Filled 2019-01-30: qty 990

## 2019-01-30 MED ORDER — ONDANSETRON 4 MG PO TBDP
4.0000 mg | ORAL_TABLET | Freq: Four times a day (QID) | ORAL | Status: DC | PRN
  Administered 2019-02-02 – 2019-02-09 (×2): 4 mg via ORAL
  Filled 2019-01-30 (×3): qty 1

## 2019-01-30 MED ORDER — LEVOTHYROXINE SODIUM 50 MCG PO TABS
50.0000 ug | ORAL_TABLET | Freq: Every day | ORAL | Status: DC
  Administered 2019-01-30 – 2019-02-11 (×13): 50 ug via ORAL
  Filled 2019-01-30 (×12): qty 1

## 2019-01-30 MED ORDER — PANTOPRAZOLE SODIUM 40 MG PO TBEC
40.0000 mg | DELAYED_RELEASE_TABLET | Freq: Two times a day (BID) | ORAL | Status: DC
  Administered 2019-01-30 – 2019-02-11 (×23): 40 mg via ORAL
  Filled 2019-01-30 (×24): qty 1

## 2019-01-30 NOTE — Progress Notes (Addendum)
PHARMACY - ADULT TOTAL PARENTERAL NUTRITION CONSULT NOTE   Pharmacy Consult for TPN Indication: Post-operative ileus following surgery for SBO  Patient Measurements: Height: 5' 6.5" (168.9 cm) Weight: 158 lb 9 oz (71.9 kg) IBW/kg (Calculated) : 60.45 TPN AdjBW (KG): 67.6 Body mass index is 25.21 kg/m.   Vitals:   01/29/19 2059 01/30/19 0438  BP: 114/80 107/64  Pulse: 76 86  Resp: 17 16  Temp: 99.2 F (37.3 C) 97.9 F (36.6 C)  SpO2: 97% 98%    Intake/Output Summary (Last 24 hours) at 01/30/2019 0718 Last data filed at 01/30/2019 0600 Gross per 24 hour  Intake 2565.86 ml  Output 1550 ml  Net 1015.86 ml    Recent Labs    01/29/19 0410 01/30/19 0345  NA 137 135  K 4.6 4.6  CL 103 101  CO2 26 25  GLUCOSE 129* 162*  BUN 31* 30*  CREATININE 0.66 0.60  CALCIUM 8.4* 8.3*  PHOS  --  3.3  MG  --  2.1  ALBUMIN  --  2.6*  ALKPHOS  --  61  AST  --  24  ALT  --  29  BILITOT  --  <0.1*  Corr Ca 6/17 = 9.2  Current Nutrition: NPO, TPN (sips of clears) IVF: LR @ 7410ml/hr Central access: PICC  TPN start date: 6/10  ASSESSMENT                                                                                        HPI: 85 y/o F with SBO underwent laparotomy and lysis of adhesions on 01/14/19, now on TPN. Hx of DM (diet-controlled) noted.  Significant events:  6/14: continue TPN, start CL soon and per CCS note may start to wean TPN soon if tolerated 6/16: started clear liquids 6/17: TPN reduced to 1/2 rate per surgery 6/18: +ileus, attempting to insert NGT but pt refusing.   6/20: Increase TPN back to full dose per surgery 6/22: repeat clamping trial but later developed nausea 6/25: NG removed, starting CLD, bowel regimen   Today:   Glucose (goal CBG<150)  Hx DM - diet controlled  CBGs mostly at goal (138-169)   Received 11 units sensitive SSI yesterday (steadily trending up since resuming full-rate TPN; 10 units regular insulin in TPN  Electrolytes - all stable  WNL  Renal - SCr, bicarb, UOP WNL, BUN elevated without obvious source  LFTs - stable WNL  TGs - stable WNL  Prealbumin - slightly worse 14.1 >> 12.3 (6/22)  NUTRITIONAL GOALS                                                                           KCal:    [id number] / day Protein: 80-95 g /day  Goal TPN rate is 75 ml/hr (provides 1854 KCal/day, 99 g protein/day, 1.8 L/day)  Keep Mg 2.0+, K 4.0+ with possible  ileus  PLAN                                                                                                         At 6pm: Continue custom TPN at goal rate of 75 ml/hr; will wean TPN once tolerating > 50-60% of kcal orally, or instructed by Surgery  Increase to 15 units insulin in TPN  Decreased K slightly, no other changes from yesterday; Cl:Ac = 1:1  TPN to contain standard multivitamins daily and trace elements MWF  Continue moderate SSI with q6 hr CBG checks   IVF per MD  TPN lab panels on Mondays & Thursdays  Bmet tomorrow   Reuel Boom, PharmD, BCPS [phone] 01/30/2019, 7:18 AM

## 2019-01-30 NOTE — Progress Notes (Addendum)
16 Days Post-Op  Subjective: CC:  Patient reports she "doesn't feel well" but is not able to tell me what this means. Notes 4 episodes of loose/waterty stools since NG tube out yesterday. She reports that she had some heart burn overnight that was improved with maalox. She notes some nausea but nothing this morning. Has not required any medication for her nausea. No emesis. Has tolerated CLD but reports everything is to sweet or salty and is asking for oatmeal. Mobilized in the halls x 2 yesterday.   Objective: Vital signs in last 24 hours: Temp:  [97.9 F (36.6 C)-99.2 F (37.3 C)] 97.9 F (36.6 C) (06/25 0438) Pulse Rate:  [76-86] 86 (06/25 0438) Resp:  [16-18] 16 (06/25 0438) BP: (107-114)/(64-80) 107/64 (06/25 0438) SpO2:  [96 %-98 %] 98 % (06/25 0438) Last BM Date: 01/26/19  Intake/Output from previous day: 06/24 0701 - 06/25 0700 In: 2565.9 [P.O.:540; I.V.:2025.9] Out: 1550 [Urine:1550] Intake/Output this shift: Total I/O In: -  Out: 200 [Urine:200]  PE: Gen: Awake and alert, NAD Lungs: Normal effort and rate Abd: Soft, ND, mild tenderness in the LUQ without r/r/g, +BS. Midline wound c/d/i with steri-strips over.  Msk: No edema  Lab Results:  Recent Labs    01/29/19 0410  WBC 6.3  HGB 9.3*  HCT 31.8*  PLT 318   BMET Recent Labs    01/29/19 0410 01/30/19 0345  NA 137 135  K 4.6 4.6  CL 103 101  CO2 26 25  GLUCOSE 129* 162*  BUN 31* 30*  CREATININE 0.66 0.60  CALCIUM 8.4* 8.3*   PT/INR No results for input(s): LABPROT, INR in the last 72 hours. CMP     Component Value Date/Time   NA 135 01/30/2019 0345   K 4.6 01/30/2019 0345   CL 101 01/30/2019 0345   CO2 25 01/30/2019 0345   GLUCOSE 162 (H) 01/30/2019 0345   BUN 30 (H) 01/30/2019 0345   CREATININE 0.60 01/30/2019 0345   CALCIUM 8.3 (L) 01/30/2019 0345   PROT 5.5 (L) 01/30/2019 0345   ALBUMIN 2.6 (L) 01/30/2019 0345   AST 24 01/30/2019 0345   ALT 29 01/30/2019 0345   ALKPHOS 61  01/30/2019 0345   BILITOT <0.1 (L) 01/30/2019 0345   GFRNONAA >60 01/30/2019 0345   GFRAA >60 01/30/2019 0345   Lipase     Component Value Date/Time   LIPASE 29 01/12/2019 0928       Studies/Results: Dg Abd Portable 1v  Result Date: 01/28/2019 CLINICAL DATA:  Ileus follow-up. EXAM: PORTABLE ABDOMEN - 1 VIEW COMPARISON:  Abdominal x-ray dated January 25, 2019. FINDINGS: Unchanged enteric tube terminating in the distal esophagus. Persistent diffuse small bowel dilatation up to 5.7 cm, minimally improved. No radio-opaque calculi or other significant radiographic abnormality are seen. IMPRESSION: 1. Persistent ileus, minimally improved. 2. Unchanged enteric tube with the tip in the distal esophagus. Recommend advancement. Electronically Signed   By: Obie DredgeWilliam T Derry M.D.   On: 01/28/2019 15:35    Anti-infectives: Anti-infectives (From admission, onward)   Start     Dose/Rate Route Frequency Ordered Stop   01/14/19 1103  sodium chloride 0.9 % with cefoTEtan (CEFOTAN) ADS Med    Note to Pharmacy: Vevelyn RoyalsHarvell, Gwendolyn  : cabinet override      01/14/19 1103 01/14/19 1124   01/14/19 1045  cefoTEtan (CEFOTAN) 2 g in sodium chloride 0.9 % 100 mL IVPB     2 g 200 mL/hr over 30 Minutes Intravenous  Once 01/14/19 1044  01/14/19 1124       Assessment/Plan Thyroid disease HTN HLD DM - above per medicine team Protein Calorie Malnutrition- Pre-albumin 8.4 (6/10)>>14.1(6/15)>>12.3(6/22)  SBO S/p Ex Lap with LOA for SBO due to Omental Adhesion,internal hernia - Dr. Rosendo Gros - 01/14/2019 - POD #16 - Mobilize for bowel function - Pulm toliet - Keep K>4 and Mg >2 for bowel function -Advance to FLD. Changed IV meds to oral - Added Protonix BID and Maalox PRN  - Continue TPN till more intake  FEN - TPN, FLD, Bowel Regimen (Miralax, Colace) VTE - Heparin, SCDs, Mobilize  ID - Cefotetan periop. None currently. Afebrile.WBC 6,300(6/24) Foley - None  Follow up- Dr. Rosendo Gros  POC - Son - Ed  Bly [phone]) - Patient's son and daughter plan to be with her when she returns home. Daughter during the day and son at night.    LOS: 18 days    Jillyn Ledger , [facility] 01/30/2019, 8:51 AM Pager: [phone]

## 2019-01-30 NOTE — Progress Notes (Signed)
PROGRESS NOTE    Christie Hinton  JXB:[id number] DOB: 07/30/1928 DOA: 01/12/2019 PCP: Sharren Bridge, NP   Brief Narrative:  85 year old female who presented abdominal pain, nausea or vomiting. She does have significant past medical history for hypertension, dyslipidemia and GERD. She reported generalized abdominal pain after a meal, associated with vomiting. She presented to the hospital due to progressive abdominal pain. On her initial physical examination her temperature was 97.7, heart rate 76, blood pressure 166/82, oxygen saturation 99%. Her lungs are clear to auscultation bilaterally, heart S1-S2 present rhythmic, the abdomen was soft, generalized abdominal pain to palpation, hypoactive bowel sounds, no lower extremity edema. CT of the abdomen with a small bowel obstruction, dilated loops of small bowel in the left abdomen with mesenteric edema and a well-defined transition point.Findings concerning for closed-loop obstruction, possibly related to an internal hernia.  Patient was admitted to the hospital working diagnosis of small bowel obstruction.  Underwent exploratory laparotomy on 01/14/19, lysis of adhesions. Complicated with post operative ileus.   Very slow to recover ileuos, required NG tube to low intermittent suction.   Assessment & Plan:   Principal Problem:   SBO (small bowel obstruction) s/p lysis of adhesions 01/14/2019 Active Problems:   Essential hypertension   Mixed hyperlipidemia   1. Small bowel obstructionsp exploratory laparotomy 01/14/19, with post operative ileus. Has been difficult to wean patient from NG tube but have successfully now.  slowly improving nausea.,  Surgery following, agree with plan, continue analgesics, FLD today, c/w tpn  2. Hypokalemia, hypophosphatemia.Corrected electrolytes with K at 4,6 and preserved renal function, cr at 0,60  3. HTN.Blood pressure is controlled, systolic 118 mmHg, will continue to hold on  antihypertensive agents.  4. Hypothyroid.On IVlevothyroxine, change to po when patient tolerating po route.  No evidence of obvious hormonal dysregulation  5. Dyslipidemia.Statin therapy.  6. T2DM. Continue with sliding scale insulin check and blood glucose AC at bedtime,  DVT prophylaxis: Lovenox SQ  Code Status: full    Code Status Orders  (From admission, onward)         Start     Ordered   01/12/19 1639  Full code  Continuous     01/12/19 1639        Code Status History    This patient has a current code status but no historical code status.   Advance Care Planning Activity     Family Communication: [phone] son Link Snuffer, discussed with him today Disposition Plan:    Patient remained inpatient postop day #15 status post ex lap with lysis of adhesions for small bowel obstruction.  Patient very slow to recover and mobilized bowel function.  Will require continued NG tube with trials of clamping.  Advance diet as tolerated.  Patient only be discharged home until she is able to have an NG tube removed and tolerated diet.  Without these treatments patient did have a clinical deterioration that could be life-threatening.  Consults called: None Admission status: Inpatient   Consultants:   gen surg  Procedures:  Dg Abd 1 View  Result Date: 01/16/2019 CLINICAL DATA:  Abdominal distension EXAM: ABDOMEN - 1 VIEW COMPARISON:  01/15/2019 FINDINGS: Persistent small bowel dilatation is noted consistent with at least partial small bowel obstruction. Contrast is noted within the right colon administered on prior CT examination. Postsurgical changes are seen. IMPRESSION: Stable small bowel dilatation. This may be related to postoperative ileus although persistent small-bowel obstruction deserves consideration as well. Correlation with the physical exam is  recommended. Electronically Signed   By: Alcide Clever M.D.   On: 01/16/2019 10:21   Dg Abd 1 View  Result Date:  01/15/2019 CLINICAL DATA:  Small-bowel obstruction EXAM: ABDOMEN - 1 VIEW COMPARISON:  01/14/2019 FINDINGS: Persistent markedly dilated small intestine consistent with ongoing small bowel obstruction. Collapsed colon containing some contrast. IMPRESSION: Persistent small bowel obstruction pattern. No visible nasogastric tube. Electronically Signed   By: Paulina Fusi M.D.   On: 01/15/2019 11:20   X-ray Abdomen Ap  Result Date: 01/14/2019 CLINICAL DATA:  Nasogastric tube placement. EXAM: ABDOMEN - 1 VIEW COMPARISON:  Radiograph of same day. FINDINGS: Slightly decreased small bowel dilatation is noted. These findings remain suspicious for distal small bowel obstruction. Midline surgical staples are noted. Nasogastric tube is seen looped within probable hiatal hernia above the diaphragm. IMPRESSION: Nasogastric tube tip appears to be looped within the hiatal hernia above the diaphragm. Slightly decreased small bowel dilatation is noted as described above. Electronically Signed   By: Lupita Raider M.D.   On: 01/14/2019 14:40   X-ray Abdomen Ap  Result Date: 01/14/2019 CLINICAL DATA:  Nasogastric tube placement. EXAM: ABDOMEN - 1 VIEW COMPARISON:  Radiograph of January 14, 2019. FINDINGS: Stable severe small bowel dilatation is noted consistent with distal small bowel obstruction. Midline surgical staples are noted. No abnormal calcifications are noted. Nasogastric tube appears to be going into the right mainstem and lower lobe bronchus. IMPRESSION: Nasogastric tube is seen going into right mainstem and lower lobe bronchus. Immediate withdrawal is recommended. Critical Value/emergent results were called by telephone at the time of interpretation on 01/14/2019 at 1:42 pm toAshura, who verbally acknowledged these results and stated that they have already removed the nasogastric tube and replaced it. Stable small bowel dilatation is noted consistent with distal small bowel obstruction. Electronically Signed   By: Lupita Raider M.D.   On: 01/14/2019 13:43   Ct Abdomen Pelvis W Contrast  Result Date: 01/12/2019 CLINICAL DATA:  85 year old with bowel obstruction. Acute lower abdominal pain with nausea. EXAM: CT ABDOMEN AND PELVIS WITH CONTRAST TECHNIQUE: Multidetector CT imaging of the abdomen and pelvis was performed using the standard protocol following bolus administration of intravenous contrast. CONTRAST:  OMNIPAQUE IOHEXOL 300 MG/ML  SOLN COMPARISON:  None. FINDINGS: Lower chest: Lung bases are clear.  No pleural effusions. Hepatobiliary: Normal appearance of the liver. Small hypodensity in the right hepatic lobe on image 19 is probably an incidental finding. Portal venous system is patent. Normal appearance of the gallbladder without inflammatory changes. No biliary dilatation. Pancreas: Unremarkable. No pancreatic ductal dilatation or surrounding inflammatory changes. Spleen: Normal in size without focal abnormality. Adrenals/Urinary Tract: Normal adrenal glands. Urinary bladder is unremarkable but slightly limited evaluation due to artifact from left hip replacement. Normal appearance of both kidneys without hydronephrosis. Stomach/Bowel: Extensive diverticulosis in the sigmoid colon and left colon without acute colonic inflammation. Normal appendix. Moderate sized hiatal hernia. Normal appearance of the duodenum. Dilated small bowel loops in the left abdomen compatible with jejunum. There is mesenteric edema associated with these dilated small bowel loops. Small bowel loops measure up to 3.3 cm. Distal small bowel is decompressed. Small bowel transition point in the left abdomen near the mesenteric edema. Transition point best seen on the coronal reformats, sequence 5 image 42 and sagittal reformats, sequence 6, image 84. Irregularity of the mesentery and fat near the bowel obstruction and raises concern for a closed loop obstruction and possibly an internal hernia. No evidence for pneumatosis  or portal air.  Vascular/Lymphatic: Atherosclerotic disease in the abdominal aorta without aneurysm. No abdominopelvic lymphadenopathy. Reproductive: Uterus and bilateral adnexa are unremarkable. Other: Small amount of free fluid in the pelvis. Minimal fluid in the left paracolic gutter. Mesenteric edema in the left abdomen associated with the dilated small bowel loops. Negative for free air. Musculoskeletal: No acute bone abnormality in the spine. Extensive facet arthropathy. IMPRESSION: 1. Small bowel obstruction. Dilated loops of small bowel in the left abdomen with mesenteric edema and well-defined transition point. Findings raise concern for a closed loop obstruction, possibly related to an internal hernia. Small amount of fluid in the abdomen and pelvis. 2. Colonic diverticulosis without acute colonic inflammation. 3. Moderate sized hiatal hernia. These results were called by telephone at the time of interpretation on 01/12/2019 at 12:08 pm to Dr. Alvira MondayERIN SCHLOSSMAN , who verbally acknowledged these results. Electronically Signed   By: Richarda OverlieAdam  Henn M.D.   On: 01/12/2019 12:16   Dg Abd 2 Views  Result Date: 01/19/2019 CLINICAL DATA:  Recent exploratory laparotomy with lysis of adhesions for small-bowel obstruction. Abdominal distension. EXAM: ABDOMEN - 2 VIEW COMPARISON:  01/16/2019 FINDINGS: The enteric tube has been removed. Postsurgical changes are again noted with skin staples remaining in place. Moderate to severe diffuse small bowel dilatation is similar to the prior study. Gas is present in nondilated colon. Residual oral contrast material has migrated distally and is now located in the descending and sigmoid colon. No intraperitoneal free air is identified. Prior left hip arthroplasty is noted. IMPRESSION: Unchanged small bowel dilatation concerning for obstruction. Electronically Signed   By: Sebastian AcheAllen  Grady M.D.   On: 01/19/2019 14:25   Dg Abd Portable 1v  Result Date: 01/28/2019 CLINICAL DATA:  Ileus follow-up. EXAM:  PORTABLE ABDOMEN - 1 VIEW COMPARISON:  Abdominal x-ray dated January 25, 2019. FINDINGS: Unchanged enteric tube terminating in the distal esophagus. Persistent diffuse small bowel dilatation up to 5.7 cm, minimally improved. No radio-opaque calculi or other significant radiographic abnormality are seen. IMPRESSION: 1. Persistent ileus, minimally improved. 2. Unchanged enteric tube with the tip in the distal esophagus. Recommend advancement. Electronically Signed   By: Obie DredgeWilliam T Derry M.D.   On: 01/28/2019 15:35   Dg Abd Portable 1v  Result Date: 01/25/2019 CLINICAL DATA:  Ileus EXAM: PORTABLE ABDOMEN - 1 VIEW COMPARISON:  01/24/2019 abdominal radiograph FINDINGS: Enteric tube terminates at the esophagogastric junction. Vertical skin staples overlie the medial left abdomen. Partially visualized left total hip arthroplasty. Prominent diffuse small bowel dilatation up to the 6.1 cm diameter, minimally improved. No evidence of pneumatosis or pneumoperitoneum. Minimal colonic stool. No radiopaque nephrolithiasis. IMPRESSION: 1. Enteric tube terminates at the esophagogastric junction. 2. Prominent diffuse small bowel dilatation, minimally improved, compatible with severe postoperative adynamic ileus. Electronically Signed   By: Delbert PhenixJason A Poff M.D.   On: 01/25/2019 07:09   Dg Abd Portable 1v  Result Date: 01/24/2019 CLINICAL DATA:  Vomiting.  Abdominal pain. EXAM: PORTABLE ABDOMEN - 1 VIEW COMPARISON:  01/23/2019.  01/19/2019. FINDINGS: Surgical staples noted over the abdomen. Persistent prominently dilated small bowel loops are again noted. Small-bowel distention may have increased from prior exam. Paucity of intra colonic air. No free air. Degenerative changes lumbar spine right hip. Total left hip replacement. IMPRESSION: Persistent prominent dilated small bowel loops are again noted. Small-bowel distention may have progressed from prior exam. Findings consistent small bowel obstruction. Electronically Signed   By:  Maisie Fushomas  Register   On: 01/24/2019 07:16   Dg Abd Portable 1v  Result Date: 01/23/2019 CLINICAL DATA:  Vomiting, ileus postop EXAM: PORTABLE ABDOMEN - 1 VIEW COMPARISON:  01/19/2019 FINDINGS: Marked gaseous distention of predominantly small bowel loops. Gas noted within nondistended stomach and large bowel. Appearance is concerning for small bowel obstruction. No visible free air organomegaly. IMPRESSION: Continued marked dilatation of small bowel loops concerning for persistent small bowel obstruction. Electronically Signed   By: Rolm Baptise M.D.   On: 01/23/2019 19:50   Dg Abd Portable 1v  Result Date: 01/23/2019 CLINICAL DATA:  NG placement EXAM: PORTABLE ABDOMEN - 1 VIEW COMPARISON:  01/23/2019 FINDINGS: NG tip within hiatal hernia.  NG above the diaphragm. Dilated small bowel loops with mild improvement since earlier today. Skin staples in the midline of the abdomen. IMPRESSION: NG tip coiled in hiatal hernia.  Decrease small bowel dilatation. Electronically Signed   By: Franchot Gallo M.D.   On: 01/23/2019 18:36   Dg Abd Portable 1v  Result Date: 01/14/2019 CLINICAL DATA:  Small-bowel obstruction EXAM: PORTABLE ABDOMEN - 1 VIEW COMPARISON:  01/13/2019 FINDINGS: Gastric catheter is noted within the stomach. Persistent and increased small-bowel dilatation is noted when compare with the previous day. Some colonic gas is noted and stable. No free air is seen. IMPRESSION: Increasing small bowel dilatation when compared with the previous day Electronically Signed   By: Inez Catalina M.D.   On: 01/14/2019 08:46   Dg Abd Portable 1v-small Bowel Obstruction Protocol-initial, 8 Hr Delay  Result Date: 01/13/2019 CLINICAL DATA:  Small-bowel obstruction. EXAM: PORTABLE ABDOMEN - 1 VIEW COMPARISON:  01/13/2019 FINDINGS: The enteric tube projects over the gastric body. The patient's oral contrast is not well appreciated on this exam and is favored to remain within the small bowel. There are multiple dilated  loops of small bowel measuring up to approximately 4.5 cm. There is no definite pneumatosis or free air. There is a moderate amount of stool in the ascending colon. The bladder appears to be partially distended with contrast. IMPRESSION: 1. Oral contrast is not well appreciated on this exam but is favored to reside within the small bowel. No definite oral contrast visualized in the colon. 2. Persistently dilated loops of small bowel measuring up to approximately 4.5 cm in diameter. 3. Enteric tube projects over the left upper quadrant. Electronically Signed   By: Constance Holster M.D.   On: 01/13/2019 20:00   Dg Abd Portable 1v-small Bowel Obstruction Protocol-initial, 8 Hr Delay  Result Date: 01/13/2019 CLINICAL DATA:  Small bowel obstruction. EXAM: PORTABLE ABDOMEN - 1 VIEW COMPARISON:  One-view abdomen 01/12/2019 FINDINGS: NG tube remains in place. Dilated loops of small bowel are slightly less prominent than on the prior study. Gas is present throughout the colon. Contrast is noted within the urinary bladder. Left hip replacement is evident. IMPRESSION: 1. Improving small bowel obstruction with NG tube in place. Electronically Signed   By: San Morelle M.D.   On: 01/13/2019 07:42   Dg Abd Portable 1v-small Bowel Protocol-position Verification  Result Date: 01/12/2019 CLINICAL DATA:  85 year old female with history of nasogastric tube placement. Evaluate for bowel obstruction. EXAM: PORTABLE ABDOMEN - 1 VIEW COMPARISON:  CT the abdomen and pelvis 01/12/2019. FINDINGS: Multiple prominent mildly dilated loops of gas-filled small bowel in the central abdomen measuring up to 3.4 cm in diameter. A small amount of gas and stool is noted in the colon. No pneumoperitoneum on this single supine view. Nasogastric tube extends into the proximal body of the stomach. Either native contrast filling the urinary bladder, related  to recent contrast enhanced CT examination. Status post left hip arthroplasty.  IMPRESSION: 1. Tip of nasogastric tube is in the proximal body of the stomach. 2. Bowel gas pattern again suggests early or partial small bowel obstruction. Electronically Signed   By: Trudie Reedaniel  Entrikin M.D.   On: 01/12/2019 19:09   Dg Vangie BickerNaso G Tube Plc W/fl W/rad  Result Date: 01/24/2019 CLINICAL DATA:  Small-bowel distention.  NG tube for decompression. EXAM: NASO G TUBE PLACEMENT WITH FL AND WITH RAD CONTRAST:  None. FLUOROSCOPY TIME:  Fluoroscopy Time:  0 minutes and 48 seconds. Radiation Exposure Index (if provided by the fluoroscopic device): 16.7 mGy Number of Acquired Spot Images: 0 COMPARISON:  Abdomen film from 01/24/2020. Prior NG tube placement 01/15/2019. FINDINGS: NG tube was placed via the patient's right nostril and easily directed into the patient's known moderate to large hiatal hernia. Multiple attempts were made to direct the NG tube through the hernia into the subdiaphragmatic portion of the stomach, but these were unsuccessful. IMPRESSION: Successful NG tube placement into the supradiaphragmatic portion of the stomach in this patient with moderate to large hiatal hernia. As before, the tube could not be directed into the infra diaphragmatic portion of the stomach due to orientation of the hiatal hernia. I discussed this study with the patient's nurse Beth, at 1451 hours on 01/24/2019. Electronically Signed   By: Kennith CenterEric  Mansell M.D.   On: 01/24/2019 14:46   Dg Intro Long Gi Tube  Result Date: 01/15/2019 CLINICAL DATA:  Small bowel obstruction.  Recent lysis of adhesions. EXAM: INTRO LONG GI TUBE CONTRAST:  50mL OMNIPAQUE IOHEXOL 300 MG/ML  SOLN FLUOROSCOPY TIME:  Fluoroscopy Time:  12 Radiation Exposure Index (if provided by the fluoroscopic device): 203.4 mGy Number of Acquired Spot Images: 1 COMPARISON:  CT 01/12/2019 FINDINGS: The patient arrived with new G-tube in the distal esophagus. Patient has a large hiatal hernia. Multiple attempts were made to pass the NG tube through the hiatal  hernia. Contrast and air were administered into the hernia/stomach. A guidewire was employed. Patient was repositioned in multiple position. Ultimately, the tip of the NG tube could not be passed into the stomach due to the orientation of the orifice of the hiatal hernia. The gastric contents and administer contrast were aspirated easily back out of the hernia sac and stomach. IMPRESSION: NG tube position within the large hiatal hernia. Tube could not be passed into the proximal stomach due to the anatomy of the hernia. The tube will likely decompress the stomach in the current position. Findings conveyed topatient's floor nurse Tracyon 01/15/2019 at15:29. Electronically Signed   By: Genevive BiStewart  Edmunds M.D.   On: 01/15/2019 16:30   [place] Ekg Site Rite  Result Date: 01/14/2019 If Site Rite image not attached, placement could not be confirmed due to current cardiac rhythm.    Antimicrobials:   none    Subjective: Patient reports does not feel quite as well she did yesterday.  NG tube is out No Vomiting, liquid stool  Objective: Vitals:   01/29/19 2059 01/30/19 0438 01/30/19 0901 01/30/19 1323  BP: 114/80 107/64 114/69 118/71  Pulse: 76 86 83 83  Resp: 17 16 14 15   Temp: 99.2 F (37.3 C) 97.9 F (36.6 C)  98.2 F (36.8 C)  TempSrc: Oral Oral  Oral  SpO2: 97% 98%  95%  Weight:      Height:        Intake/Output Summary (Last 24 hours) at 01/30/2019 1422 Last data filed at 01/30/2019  [id number] Gross per 24 hour  Intake 2685.86 ml  Output 1250 ml  Net 1435.86 ml   Filed Weights   01/12/19 0929 01/21/19 1215  Weight: 67.6 kg 71.9 kg    Examination:   General exam: Appears calm and comfortable NG tube in place Respiratory system: Clear to auscultation. Respiratory effort normal. Cardiovascular system: S1 & S2 heard, RRR. No JVD, murmurs, rubs, gallops or clicks. No pedal edema. Gastrointestinal system: Quiet bowel sounds, soft mildly tender to palpation diffusely no focal findings, no  rebound no guarding not rigid Central nervous system: Alert and oriented. No focal neurological deficits. Extremities: Moves all 4 extremities freely no focal neurological deficits Skin: No rashes, lesions or ulcers Psychiatry: Judgement and insight appear normal. Mood & affect appropriate.     Data Reviewed: I have personally reviewed following labs and imaging studies  CBC: Recent Labs  Lab 01/25/19 0424 01/27/19 0349 01/29/19 0410  WBC 7.6 6.6 6.3  NEUTROABS 5.6 4.6 4.3  HGB 9.5* 9.4* 9.3*  HCT 31.2* 31.9* 31.8*  MCV 84.3 84.4 85.7  PLT 316 336 318   Basic Metabolic Panel: Recent Labs  Lab 01/24/19 0421  01/26/19 0324 01/27/19 0349 01/28/19 0427 01/29/19 0410 01/30/19 0345  NA 134*   < > 136 135 135 137 135  K 4.0   < > 3.9 4.5 4.5 4.6 4.6  CL 101   < > 102 103 103 103 101  CO2 26   < > 26 23 24 26 25   GLUCOSE 98   < > 143* 113* 143* 129* 162*  BUN 25*   < > 20 25* 29* 31* 30*  CREATININE 0.64   < > 0.60 0.53 0.62 0.66 0.60  CALCIUM 7.7*   < > 8.1* 8.1* 8.2* 8.4* 8.3*  MG 2.1  --   --  2.2  --   --  2.1  PHOS  --   --   --  3.4  --   --  3.3   < > = values in this interval not displayed.   GFR: Estimated Creatinine Clearance: 45.5 mL/min (by C-G formula based on SCr of 0.6 mg/dL). Liver Function Tests: Recent Labs  Lab 01/27/19 0349 01/30/19 0345  AST 22 24  ALT 26 29  ALKPHOS 53 61  BILITOT 0.3 <0.1*  PROT 5.6* 5.5*  ALBUMIN 2.6* 2.6*   No results for input(s): LIPASE, AMYLASE in the last 168 hours. No results for input(s): AMMONIA in the last 168 hours. Coagulation Profile: No results for input(s): INR, PROTIME in the last 168 hours. Cardiac Enzymes: No results for input(s): CKTOTAL, CKMB, CKMBINDEX, TROPONINI in the last 168 hours. BNP (last 3 results) No results for input(s): PROBNP in the last 8760 hours. HbA1C: No results for input(s): HGBA1C in the last 72 hours. CBG: Recent Labs  Lab 01/29/19 0604 01/29/19 1132 01/29/19 1746  01/29/19 2345 01/30/19 0612  GLUCAP 143* 151* 138* 147* 169*   Lipid Profile: No results for input(s): CHOL, HDL, LDLCALC, TRIG, CHOLHDL, LDLDIRECT in the last 72 hours. Thyroid Function Tests: No results for input(s): TSH, T4TOTAL, FREET4, T3FREE, THYROIDAB in the last 72 hours. Anemia Panel: No results for input(s): VITAMINB12, FOLATE, FERRITIN, TIBC, IRON, RETICCTPCT in the last 72 hours. Sepsis Labs: No results for input(s): PROCALCITON, LATICACIDVEN in the last 168 hours.  No results found for this or any previous visit (from the past 240 hour(s)).       Radiology Studies: Dg Abd Portable 1v  Result Date:  01/28/2019 CLINICAL DATA:  Ileus follow-up. EXAM: PORTABLE ABDOMEN - 1 VIEW COMPARISON:  Abdominal x-ray dated January 25, 2019. FINDINGS: Unchanged enteric tube terminating in the distal esophagus. Persistent diffuse small bowel dilatation up to 5.7 cm, minimally improved. No radio-opaque calculi or other significant radiographic abnormality are seen. IMPRESSION: 1. Persistent ileus, minimally improved. 2. Unchanged enteric tube with the tip in the distal esophagus. Recommend advancement. Electronically Signed   By: Obie Dredge M.D.   On: 01/28/2019 15:35        Scheduled Meds:  docusate sodium  100 mg Oral BID   heparin  5,000 Units Subcutaneous Q8H   insulin aspart  0-15 Units Subcutaneous Q6H   latanoprost  1 drop Both Eyes QHS   levothyroxine  50 mcg Oral Q0600   loratadine  10 mg Oral Daily   pantoprazole  40 mg Oral BID   polyethylene glycol  17 g Oral Daily   sodium chloride flush  10-40 mL Intracatheter Q12H   Continuous Infusions:  lactated ringers 10 mL/hr at 01/29/19 1830   TPN ADULT (ION) 75 mL/hr at 01/29/19 1830   TPN ADULT (ION)       LOS: 18 days    Time spent: 35 min    Burke Keels, MD [facility]  If 7PM-7AM, please contact night-coverage  01/30/2019, 2:22 PM

## 2019-01-31 LAB — BASIC METABOLIC PANEL
Anion gap: 8 (ref 5–15)
BUN: 27 mg/dL — ABNORMAL HIGH (ref 8–23)
CO2: 23 mmol/L (ref 22–32)
Calcium: 8.1 mg/dL — ABNORMAL LOW (ref 8.9–10.3)
Chloride: 103 mmol/L (ref 98–111)
Creatinine, Ser: 0.57 mg/dL (ref 0.44–1.00)
GFR calc Af Amer: >60 mL/min (ref >60)
GFR calc non Af Amer: >60 mL/min (ref >60)
Glucose, Bld: 134 mg/dL — ABNORMAL HIGH (ref 70–99)
Potassium: 4.6 mmol/L (ref 3.5–5.1)
Sodium: 134 mmol/L — ABNORMAL LOW (ref 135–145)

## 2019-01-31 LAB — GLUCOSE, CAPILLARY
Glucose-Capillary: 135 mg/dL — ABNORMAL HIGH (ref 70–99)
Glucose-Capillary: 146 mg/dL — ABNORMAL HIGH (ref 70–99)
Glucose-Capillary: 133 mg/dL — ABNORMAL HIGH (ref 70–99)

## 2019-01-31 MED ORDER — INSULIN ASPART 100 UNIT/ML ~~LOC~~ SOLN
0.0000 [IU] | Freq: Three times a day (TID) | SUBCUTANEOUS | Status: DC
  Administered 2019-01-31 – 2019-02-02 (×6): 2 [IU] via SUBCUTANEOUS
  Administered 2019-02-02: 3 [IU] via SUBCUTANEOUS
  Administered 2019-02-02 – 2019-02-03 (×2): 2 [IU] via SUBCUTANEOUS
  Administered 2019-02-03: 3 [IU] via SUBCUTANEOUS
  Administered 2019-02-04 – 2019-02-10 (×13): 2 [IU] via SUBCUTANEOUS

## 2019-01-31 MED ORDER — TRAVASOL 10 % IV SOLN
INTRAVENOUS | Status: AC
  Administered 2019-01-31: 18:00:00 via INTRAVENOUS
  Filled 2019-01-31: qty 990

## 2019-01-31 NOTE — Progress Notes (Signed)
Physical Therapy Treatment Patient Details Name: Christie Hinton MRN: [id number] DOB: 27-Apr-1928 Today's Date: 01/31/2019    History of Present Illness 85 yo female admitted with SBO. S/P lysis of adhesions 01/14/19. Hx of DM    PT Comments    Patient progressing slowly due to nausea and fatigue from multiple trips to bathroom, but still willing to participate and able to walk the unit.  She is safe with use of rollator in small spaces so appropriate to get one for home if she wishes.  PT to follow acutely.   Follow Up Recommendations  Home health PT;Supervision - Intermittent     Equipment Recommendations  Other (comment)(rollator)    Recommendations for Other Services       Precautions / Restrictions Precautions Precautions: Fall    Mobility  Bed Mobility Overal bed mobility: Needs Assistance       Supine to sit: Min assist Sit to supine: Min guard   General bed mobility comments: assist to lift as pt reaching out for hand assist to sit up, to supine assist for straightening sheets, pad, etc  Transfers Overall transfer level: Needs assistance Equipment used: 4-wheeled walker Transfers: Sit to/from Stand Sit to Stand: Min guard         General transfer comment: pulling up on rollator so assist to steady  Ambulation/Gait Ambulation/Gait assistance: Min guard;Supervision Gait Distance (Feet): 400 Feet Assistive device: 4-wheeled walker Gait Pattern/deviations: Step-through pattern;Decreased stride length     General Gait Details: able to demonstrate safe technique with rollator in bathroom and backing up to bed   Stairs             Wheelchair Mobility    Modified Rankin (Stroke Patients Only)       Balance Overall balance assessment: Needs assistance Sitting-balance support: No upper extremity supported Sitting balance-Leahy Scale: Good       Standing balance-Leahy Scale: Fair Standing balance comment: need UE support for ambulation                             Cognition Arousal/Alertness: Awake/alert Behavior During Therapy: WFL for tasks assessed/performed Overall Cognitive Status: Within Functional Limits for tasks assessed                                        Exercises      General Comments General comments (skin integrity, edema, etc.): Toileted with S      Pertinent Vitals/Pain Pain Assessment: No/denies pain Pain Location: just feels nauseated    Home Living                      Prior Function            PT Goals (current goals can now be found in the care plan section) Progress towards PT goals: Progressing toward goals    Frequency    Min 3X/week      PT Plan Current plan remains appropriate    Co-evaluation              AM-PAC PT "6 Clicks" Mobility   Outcome Measure  Help needed turning from your back to your side while in a flat bed without using bedrails?: A Little Help needed moving from lying on your back to sitting on the side of a flat bed without using bedrails?: A Little  Help needed moving to and from a bed to a chair (including a wheelchair)?: A Little Help needed standing up from a chair using your arms (e.g., wheelchair or bedside chair)?: A Little Help needed to walk in hospital room?: A Little Help needed climbing 3-5 steps with a railing? : A Little 6 Click Score: 18    End of Session   Activity Tolerance: Patient tolerated treatment well Patient left: in bed;with call bell/phone within reach;with bed alarm set   PT Visit Diagnosis: Unsteadiness on feet (R26.81);Muscle weakness (generalized) (M62.81)     Time: 7124-5809 PT Time Calculation (min) (ACUTE ONLY): 13 min  Charges:  $Gait Training: 8-22 mins                     Christie Hinton, Christie Hinton [phone] 01/31/2019    Christie Hinton 01/31/2019, 3:47 PM

## 2019-01-31 NOTE — Progress Notes (Signed)
17 Days Post-Op  Subjective: CC: Patient reports she did well overnight. Tolerated FLD although did not eat much at all. No abdominal pain, N/V. Is passing flatus. She reports no BM yesterday but has 2 recorded.   Objective: Vital signs in last 24 hours: Temp:  [97.9 F (36.6 C)-98.8 F (37.1 C)] 98.4 F (36.9 C) (06/26 0700) Pulse Rate:  [82-84] 82 (06/26 0700) Resp:  [14-16] 16 (06/26 0700) BP: (105-118)/(60-71) 113/64 (06/26 0700) SpO2:  [95 %-99 %] 97 % (06/26 0700) Last BM Date: 01/30/19  Intake/Output from previous day: 06/25 0701 - 06/26 0700 In: 2277.9 [P.O.:240; I.V.:2037.9] Out: 200 [Urine:200] Intake/Output this shift: No intake/output data recorded.  PE: Gen: Awake and alert, NAD Lungs: Normal effort and rate Abd: Soft, ND, mild tenderness in the LUQ without r/r/g, +BS. Midline wound c/d/i with steri-strips over.  Msk: No edema  Lab Results:  Recent Labs    01/29/19 0410  WBC 6.3  HGB 9.3*  HCT 31.8*  PLT 318   BMET Recent Labs    01/30/19 0345 01/31/19 0344  NA 135 134*  K 4.6 4.6  CL 101 103  CO2 25 23  GLUCOSE 162* 134*  BUN 30* 27*  CREATININE 0.60 0.57  CALCIUM 8.3* 8.1*   PT/INR No results for input(s): LABPROT, INR in the last 72 hours. CMP     Component Value Date/Time   NA 134 (L) 01/31/2019 0344   K 4.6 01/31/2019 0344   CL 103 01/31/2019 0344   CO2 23 01/31/2019 0344   GLUCOSE 134 (H) 01/31/2019 0344   BUN 27 (H) 01/31/2019 0344   CREATININE 0.57 01/31/2019 0344   CALCIUM 8.1 (L) 01/31/2019 0344   PROT 5.5 (L) 01/30/2019 0345   ALBUMIN 2.6 (L) 01/30/2019 0345   AST 24 01/30/2019 0345   ALT 29 01/30/2019 0345   ALKPHOS 61 01/30/2019 0345   BILITOT <0.1 (L) 01/30/2019 0345   GFRNONAA >60 01/31/2019 0344   GFRAA >60 01/31/2019 0344   Lipase     Component Value Date/Time   LIPASE 29 01/12/2019 0928       Studies/Results: No results found.  Anti-infectives: Anti-infectives (From admission, onward)   Start      Dose/Rate Route Frequency Ordered Stop   01/14/19 1103  sodium chloride 0.9 % with cefoTEtan (CEFOTAN) ADS Med    Note to Pharmacy: Vevelyn RoyalsHarvell, Gwendolyn  : cabinet override      01/14/19 1103 01/14/19 1124   01/14/19 1045  cefoTEtan (CEFOTAN) 2 g in sodium chloride 0.9 % 100 mL IVPB     2 g 200 mL/hr over 30 Minutes Intravenous  Once 01/14/19 1044 01/14/19 1124       Assessment/Plan Thyroid disease HTN HLD DM - above per medicine team Protein Calorie Malnutrition- Pre-albumin 8.4 (6/10)>>14.1(6/15)>>12.3(6/22)  SBO S/p Ex Lap with LOA for SBO due to Omental Adhesion,internal hernia - Dr. Derrell Lollingamirez - 01/14/2019 - POD #17 - Mobilize for bowel function - Pulm toliet - Keep K>4 and Mg >2 for bowel function -Advance to soft. - Continue TPN till more intake. May require calorie count depending on how she does tonight.  FEN - TPN, Soft, Bowel Regimen (Miralax, Colace) VTE - Heparin, SCDs, Mobilize  ID - Cefotetan periop. None currently. Afebrile. Foley - None  Follow up- Dr. Derrell Lollingamirez  POC - Son - Rockie Neighboursd Bly [phone] -Patient's son and daughter plan to be with her when she returns home. Daughter during the day and son at night.  LOS: 19 days    Jillyn Ledger , [facility] 01/31/2019, 7:51 AM Pager: [phone]

## 2019-01-31 NOTE — Progress Notes (Signed)
PROGRESS NOTE    Christie Hinton  RUE:[id number] DOB: Oct 12, 1927 DOA: 01/12/2019 PCP: Sharren Bridge, NP   Brief Narrative:  85 year old female who presented abdominal pain, nausea or vomiting. She does have significant past medical history for hypertension, dyslipidemia and GERD. She reported generalized abdominal pain after a meal, associated with vomiting. She presented to the hospital due to progressive abdominal pain. On her initial physical examination her temperature was 97.7, heart rate 76, blood pressure 166/82, oxygen saturation 99%. Her lungs are clear to auscultation bilaterally, heart S1-S2 present rhythmic, the abdomen was soft, generalized abdominal pain to palpation, hypoactive bowel sounds, no lower extremity edema. CT of the abdomen with a small bowel obstruction, dilated loops of small bowel in the left abdomen with mesenteric edema and a well-defined transition point.Findings concerning for closed-loop obstruction, possibly related to an internal hernia.  Patient was admitted to the hospital working diagnosis of small bowel obstruction.  Underwent exploratory laparotomy on 01/14/19, lysis of adhesions. Complicated with post operative ileus.   Very slow to recover ileuos, required NG tube to low intermittent suction.   Assessment & Plan:   Principal Problem:   SBO (small bowel obstruction) s/p lysis of adhesions 01/14/2019 Active Problems:   Essential hypertension   Mixed hyperlipidemia   1. Small bowel obstructionsp exploratory laparotomy 01/14/19, with post operative ileus.Has been difficult to wean patient from NG tube but have successfully now. slowly improving nausea-reported none overnight, denied bm to me also, but EMR reports 2.,Surgery following, agree with plan, continue analgesics, advancing to soft diet today, c/w tpn until po intake picks up  2. Hypokalemia, hypophosphatemia.Corrected electrolytes with K at 4,6and preserved renal  function, cr at 0,57  3. HTN.Blood pressure is controlled, systolic121mmHg, will continue to hold on antihypertensive agents.  4. Hypothyroid.On IVlevothyroxine, change to po when patient tolerating po route.No evidence of obvious hormonal dysregulation  5. Dyslipidemia.Statin therapy.  6. T2DM.Continue with sliding scale insulin check and blood glucose AC at bedtime,  DVT prophylaxis: Lovenox SQ  Code Status: full    Code Status Orders  (From admission, onward)         Start     Ordered   01/12/19 1639  Full code  Continuous     01/12/19 1639        Code Status History    This patient has a current code status but no historical code status.   Advance Care Planning Activity     Family Communication: none today  Disposition Plan:   Patient remained inpatient postop day #17 status post ex lap with lysis of adhesions for small bowel obstruction. Patient very slow to recover and mobilized bowel function. Advancing diet to see if po intake sufficent to d/c tpn,  Patient unable to be discharged home until she is able to tolerate diet. Without these treatments patient did have a clinical deterioration that could be life-threatening.  Consults called: None Admission status: Inpatient   Consultants:   gen surg  Procedures:  Dg Abd 1 View  Result Date: 01/16/2019 CLINICAL DATA:  Abdominal distension EXAM: ABDOMEN - 1 VIEW COMPARISON:  01/15/2019 FINDINGS: Persistent small bowel dilatation is noted consistent with at least partial small bowel obstruction. Contrast is noted within the right colon administered on prior CT examination. Postsurgical changes are seen. IMPRESSION: Stable small bowel dilatation. This may be related to postoperative ileus although persistent small-bowel obstruction deserves consideration as well. Correlation with the physical exam is recommended. Electronically Signed   By: Alcide Clever  M.D.   On: 01/16/2019 10:21   Dg Abd 1  View  Result Date: 01/15/2019 CLINICAL DATA:  Small-bowel obstruction EXAM: ABDOMEN - 1 VIEW COMPARISON:  01/14/2019 FINDINGS: Persistent markedly dilated small intestine consistent with ongoing small bowel obstruction. Collapsed colon containing some contrast. IMPRESSION: Persistent small bowel obstruction pattern. No visible nasogastric tube. Electronically Signed   By: Paulina FusiMark  Shogry M.D.   On: 01/15/2019 11:20   X-ray Abdomen Ap  Result Date: 01/14/2019 CLINICAL DATA:  Nasogastric tube placement. EXAM: ABDOMEN - 1 VIEW COMPARISON:  Radiograph of same day. FINDINGS: Slightly decreased small bowel dilatation is noted. These findings remain suspicious for distal small bowel obstruction. Midline surgical staples are noted. Nasogastric tube is seen looped within probable hiatal hernia above the diaphragm. IMPRESSION: Nasogastric tube tip appears to be looped within the hiatal hernia above the diaphragm. Slightly decreased small bowel dilatation is noted as described above. Electronically Signed   By: Lupita RaiderJames  Green Jr M.D.   On: 01/14/2019 14:40   X-ray Abdomen Ap  Result Date: 01/14/2019 CLINICAL DATA:  Nasogastric tube placement. EXAM: ABDOMEN - 1 VIEW COMPARISON:  Radiograph of January 14, 2019. FINDINGS: Stable severe small bowel dilatation is noted consistent with distal small bowel obstruction. Midline surgical staples are noted. No abnormal calcifications are noted. Nasogastric tube appears to be going into the right mainstem and lower lobe bronchus. IMPRESSION: Nasogastric tube is seen going into right mainstem and lower lobe bronchus. Immediate withdrawal is recommended. Critical Value/emergent results were called by telephone at the time of interpretation on 01/14/2019 at 1:42 pm toAshura, who verbally acknowledged these results and stated that they have already removed the nasogastric tube and replaced it. Stable small bowel dilatation is noted consistent with distal small bowel obstruction. Electronically  Signed   By: Lupita RaiderJames  Green Jr M.D.   On: 01/14/2019 13:43   Ct Abdomen Pelvis W Contrast  Result Date: 01/12/2019 CLINICAL DATA:  85 year old with bowel obstruction. Acute lower abdominal pain with nausea. EXAM: CT ABDOMEN AND PELVIS WITH CONTRAST TECHNIQUE: Multidetector CT imaging of the abdomen and pelvis was performed using the standard protocol following bolus administration of intravenous contrast. CONTRAST:  100mL OMNIPAQUE IOHEXOL 300 MG/ML  SOLN COMPARISON:  None. FINDINGS: Lower chest: Lung bases are clear.  No pleural effusions. Hepatobiliary: Normal appearance of the liver. Small hypodensity in the right hepatic lobe on image 19 is probably an incidental finding. Portal venous system is patent. Normal appearance of the gallbladder without inflammatory changes. No biliary dilatation. Pancreas: Unremarkable. No pancreatic ductal dilatation or surrounding inflammatory changes. Spleen: Normal in size without focal abnormality. Adrenals/Urinary Tract: Normal adrenal glands. Urinary bladder is unremarkable but slightly limited evaluation due to artifact from left hip replacement. Normal appearance of both kidneys without hydronephrosis. Stomach/Bowel: Extensive diverticulosis in the sigmoid colon and left colon without acute colonic inflammation. Normal appendix. Moderate sized hiatal hernia. Normal appearance of the duodenum. Dilated small bowel loops in the left abdomen compatible with jejunum. There is mesenteric edema associated with these dilated small bowel loops. Small bowel loops measure up to 3.3 cm. Distal small bowel is decompressed. Small bowel transition point in the left abdomen near the mesenteric edema. Transition point best seen on the coronal reformats, sequence 5 image 42 and sagittal reformats, sequence 6, image 84. Irregularity of the mesentery and fat near the bowel obstruction and raises concern for a closed loop obstruction and possibly an internal hernia. No evidence for pneumatosis  or portal air. Vascular/Lymphatic: Atherosclerotic disease in the  abdominal aorta without aneurysm. No abdominopelvic lymphadenopathy. Reproductive: Uterus and bilateral adnexa are unremarkable. Other: Small amount of free fluid in the pelvis. Minimal fluid in the left paracolic gutter. Mesenteric edema in the left abdomen associated with the dilated small bowel loops. Negative for free air. Musculoskeletal: No acute bone abnormality in the spine. Extensive facet arthropathy. IMPRESSION: 1. Small bowel obstruction. Dilated loops of small bowel in the left abdomen with mesenteric edema and well-defined transition point. Findings raise concern for a closed loop obstruction, possibly related to an internal hernia. Small amount of fluid in the abdomen and pelvis. 2. Colonic diverticulosis without acute colonic inflammation. 3. Moderate sized hiatal hernia. These results were called by telephone at the time of interpretation on 01/12/2019 at 12:08 pm to Dr. Alvira MondayERIN SCHLOSSMAN , who verbally acknowledged these results. Electronically Signed   By: Richarda OverlieAdam  Henn M.D.   On: 01/12/2019 12:16   Dg Abd 2 Views  Result Date: 01/19/2019 CLINICAL DATA:  Recent exploratory laparotomy with lysis of adhesions for small-bowel obstruction. Abdominal distension. EXAM: ABDOMEN - 2 VIEW COMPARISON:  01/16/2019 FINDINGS: The enteric tube has been removed. Postsurgical changes are again noted with skin staples remaining in place. Moderate to severe diffuse small bowel dilatation is similar to the prior study. Gas is present in nondilated colon. Residual oral contrast material has migrated distally and is now located in the descending and sigmoid colon. No intraperitoneal free air is identified. Prior left hip arthroplasty is noted. IMPRESSION: Unchanged small bowel dilatation concerning for obstruction. Electronically Signed   By: Sebastian AcheAllen  Grady M.D.   On: 01/19/2019 14:25   Dg Abd Portable 1v  Result Date: 01/28/2019 CLINICAL DATA:  Ileus  follow-up. EXAM: PORTABLE ABDOMEN - 1 VIEW COMPARISON:  Abdominal x-ray dated January 25, 2019. FINDINGS: Unchanged enteric tube terminating in the distal esophagus. Persistent diffuse small bowel dilatation up to 5.7 cm, minimally improved. No radio-opaque calculi or other significant radiographic abnormality are seen. IMPRESSION: 1. Persistent ileus, minimally improved. 2. Unchanged enteric tube with the tip in the distal esophagus. Recommend advancement. Electronically Signed   By: Obie DredgeWilliam T Derry M.D.   On: 01/28/2019 15:35   Dg Abd Portable 1v  Result Date: 01/25/2019 CLINICAL DATA:  Ileus EXAM: PORTABLE ABDOMEN - 1 VIEW COMPARISON:  01/24/2019 abdominal radiograph FINDINGS: Enteric tube terminates at the esophagogastric junction. Vertical skin staples overlie the medial left abdomen. Partially visualized left total hip arthroplasty. Prominent diffuse small bowel dilatation up to the 6.1 cm diameter, minimally improved. No evidence of pneumatosis or pneumoperitoneum. Minimal colonic stool. No radiopaque nephrolithiasis. IMPRESSION: 1. Enteric tube terminates at the esophagogastric junction. 2. Prominent diffuse small bowel dilatation, minimally improved, compatible with severe postoperative adynamic ileus. Electronically Signed   By: Delbert PhenixJason A Poff M.D.   On: 01/25/2019 07:09   Dg Abd Portable 1v  Result Date: 01/24/2019 CLINICAL DATA:  Vomiting.  Abdominal pain. EXAM: PORTABLE ABDOMEN - 1 VIEW COMPARISON:  01/23/2019.  01/19/2019. FINDINGS: Surgical staples noted over the abdomen. Persistent prominently dilated small bowel loops are again noted. Small-bowel distention may have increased from prior exam. Paucity of intra colonic air. No free air. Degenerative changes lumbar spine right hip. Total left hip replacement. IMPRESSION: Persistent prominent dilated small bowel loops are again noted. Small-bowel distention may have progressed from prior exam. Findings consistent small bowel obstruction.  Electronically Signed   By: Maisie Fushomas  Register   On: 01/24/2019 07:16   Dg Abd Portable 1v  Result Date: 01/23/2019 CLINICAL DATA:  Vomiting, ileus postop  EXAM: PORTABLE ABDOMEN - 1 VIEW COMPARISON:  01/19/2019 FINDINGS: Marked gaseous distention of predominantly small bowel loops. Gas noted within nondistended stomach and large bowel. Appearance is concerning for small bowel obstruction. No visible free air organomegaly. IMPRESSION: Continued marked dilatation of small bowel loops concerning for persistent small bowel obstruction. Electronically Signed   By: Charlett Nose M.D.   On: 01/23/2019 19:50   Dg Abd Portable 1v  Result Date: 01/23/2019 CLINICAL DATA:  NG placement EXAM: PORTABLE ABDOMEN - 1 VIEW COMPARISON:  01/23/2019 FINDINGS: NG tip within hiatal hernia.  NG above the diaphragm. Dilated small bowel loops with mild improvement since earlier today. Skin staples in the midline of the abdomen. IMPRESSION: NG tip coiled in hiatal hernia.  Decrease small bowel dilatation. Electronically Signed   By: Marlan Palau M.D.   On: 01/23/2019 18:36   Dg Abd Portable 1v  Result Date: 01/14/2019 CLINICAL DATA:  Small-bowel obstruction EXAM: PORTABLE ABDOMEN - 1 VIEW COMPARISON:  01/13/2019 FINDINGS: Gastric catheter is noted within the stomach. Persistent and increased small-bowel dilatation is noted when compare with the previous day. Some colonic gas is noted and stable. No free air is seen. IMPRESSION: Increasing small bowel dilatation when compared with the previous day Electronically Signed   By: Alcide Clever M.D.   On: 01/14/2019 08:46   Dg Abd Portable 1v-small Bowel Obstruction Protocol-initial, 8 Hr Delay  Result Date: 01/13/2019 CLINICAL DATA:  Small-bowel obstruction. EXAM: PORTABLE ABDOMEN - 1 VIEW COMPARISON:  01/13/2019 FINDINGS: The enteric tube projects over the gastric body. The patient's oral contrast is not well appreciated on this exam and is favored to remain within the small bowel.  There are multiple dilated loops of small bowel measuring up to approximately 4.5 cm. There is no definite pneumatosis or free air. There is a moderate amount of stool in the ascending colon. The bladder appears to be partially distended with contrast. IMPRESSION: 1. Oral contrast is not well appreciated on this exam but is favored to reside within the small bowel. No definite oral contrast visualized in the colon. 2. Persistently dilated loops of small bowel measuring up to approximately 4.5 cm in diameter. 3. Enteric tube projects over the left upper quadrant. Electronically Signed   By: Katherine Mantle M.D.   On: 01/13/2019 20:00   Dg Abd Portable 1v-small Bowel Obstruction Protocol-initial, 8 Hr Delay  Result Date: 01/13/2019 CLINICAL DATA:  Small bowel obstruction. EXAM: PORTABLE ABDOMEN - 1 VIEW COMPARISON:  One-view abdomen 01/12/2019 FINDINGS: NG tube remains in place. Dilated loops of small bowel are slightly less prominent than on the prior study. Gas is present throughout the colon. Contrast is noted within the urinary bladder. Left hip replacement is evident. IMPRESSION: 1. Improving small bowel obstruction with NG tube in place. Electronically Signed   By: Marin Roberts M.D.   On: 01/13/2019 07:42   Dg Abd Portable 1v-small Bowel Protocol-position Verification  Result Date: 01/12/2019 CLINICAL DATA:  86 year old female with history of nasogastric tube placement. Evaluate for bowel obstruction. EXAM: PORTABLE ABDOMEN - 1 VIEW COMPARISON:  CT the abdomen and pelvis 01/12/2019. FINDINGS: Multiple prominent mildly dilated loops of gas-filled small bowel in the central abdomen measuring up to 3.4 cm in diameter. A small amount of gas and stool is noted in the colon. No pneumoperitoneum on this single supine view. Nasogastric tube extends into the proximal body of the stomach. Either native contrast filling the urinary bladder, related to recent contrast enhanced CT examination. Status post  left hip arthroplasty. IMPRESSION: 1. Tip of nasogastric tube is in the proximal body of the stomach. 2. Bowel gas pattern again suggests early or partial small bowel obstruction. Electronically Signed   By: Trudie Reed M.D.   On: 01/12/2019 19:09   Dg Vangie Bicker G Tube Plc W/fl W/rad  Result Date: 01/24/2019 CLINICAL DATA:  Small-bowel distention.  NG tube for decompression. EXAM: NASO G TUBE PLACEMENT WITH FL AND WITH RAD CONTRAST:  None. FLUOROSCOPY TIME:  Fluoroscopy Time:  0 minutes and 48 seconds. Radiation Exposure Index (if provided by the fluoroscopic device): 16.7 mGy Number of Acquired Spot Images: 0 COMPARISON:  Abdomen film from 01/24/2020. Prior NG tube placement 01/15/2019. FINDINGS: NG tube was placed via the patient's right nostril and easily directed into the patient's known moderate to large hiatal hernia. Multiple attempts were made to direct the NG tube through the hernia into the subdiaphragmatic portion of the stomach, but these were unsuccessful. IMPRESSION: Successful NG tube placement into the supradiaphragmatic portion of the stomach in this patient with moderate to large hiatal hernia. As before, the tube could not be directed into the infra diaphragmatic portion of the stomach due to orientation of the hiatal hernia. I discussed this study with the patient's nurse Beth, at 1451 hours on 01/24/2019. Electronically Signed   By: Kennith Center M.D.   On: 01/24/2019 14:46   Dg Intro Long Gi Tube  Result Date: 01/15/2019 CLINICAL DATA:  Small bowel obstruction.  Recent lysis of adhesions. EXAM: INTRO LONG GI TUBE CONTRAST:  50mL OMNIPAQUE IOHEXOL 300 MG/ML  SOLN FLUOROSCOPY TIME:  Fluoroscopy Time:  12 Radiation Exposure Index (if provided by the fluoroscopic device): 203.4 mGy Number of Acquired Spot Images: 1 COMPARISON:  CT 01/12/2019 FINDINGS: The patient arrived with new G-tube in the distal esophagus. Patient has a large hiatal hernia. Multiple attempts were made to pass the NG  tube through the hiatal hernia. Contrast and air were administered into the hernia/stomach. A guidewire was employed. Patient was repositioned in multiple position. Ultimately, the tip of the NG tube could not be passed into the stomach due to the orientation of the orifice of the hiatal hernia. The gastric contents and administer contrast were aspirated easily back out of the hernia sac and stomach. IMPRESSION: NG tube position within the large hiatal hernia. Tube could not be passed into the proximal stomach due to the anatomy of the hernia. The tube will likely decompress the stomach in the current position. Findings conveyed topatient's floor nurse Tracyon 01/15/2019 at15:29. Electronically Signed   By: Genevive Bi M.D.   On: 01/15/2019 16:30   [place] Ekg Site Rite  Result Date: 01/14/2019 If Site Rite image not attached, placement could not be confirmed due to current cardiac rhythm.    Antimicrobials:   none    Subjective: Patient reports nausea has improved, Tolerated full liquids Denied bowel movement although EMR reports 2 bowel movements  Objective: Vitals:   01/30/19 1630 01/30/19 2138 01/31/19 0700 01/31/19 0956  BP: 113/65 105/60 113/64 114/65  Pulse: 84 82 82 85  Resp: Temp: 97.9 F (36.6 C) 98.8 F (37.1 C) 98.4 F (36.9 C) 97.6 F (36.4 C)  TempSrc: Oral Oral Oral Oral  SpO2: 96% 99% 97% 98%  Weight:      Height:        Intake/Output Summary (Last 24 hours) at 01/31/2019 1435 Last data filed at 01/31/2019 1330 Gross per 24 hour  Intake 2277.86 ml  Output 0 ml  Net 2277.86 ml   Filed Weights   01/12/19 0929 01/21/19 1215  Weight: 67.6 kg 71.9 kg    Examination:  General exam:Appears calm and comfortable NG tube in place Respiratory system: Clear to auscultation. Respiratory effort normal. Cardiovascular system:S1 &S2 heard, RRR. No JVD, murmurs, rubs, gallops or clicks. No pedal edema. Gastrointestinal system:Quiet bowel sounds, soft  mildly tender to palpation diffusely no focal findings, no rebound no guarding not rigid Central nervous system:Alert and oriented. No focal neurological deficits. Extremities:Moves all 4 extremities freely no focal neurological deficits Skin: No rashes, lesions or ulcers Psychiatry:Judgement and insight appear normal. Mood &affect appropriate.     Data Reviewed: I have personally reviewed following labs and imaging studies  CBC: Recent Labs  Lab 01/25/19 0424 01/27/19 0349 01/29/19 0410  WBC 7.6 6.6 6.3  NEUTROABS 5.6 4.6 4.3  HGB 9.5* 9.4* 9.3*  HCT 31.2* 31.9* 31.8*  MCV 84.3 84.4 85.7  PLT 316 336 423   Basic Metabolic Panel: Recent Labs  Lab 01/27/19 0349 01/28/19 0427 01/29/19 0410 01/30/19 0345 01/31/19 0344  NA 135 135 137 135 134*  K 4.5 4.5 4.6 4.6 4.6  CL 103 103 103 101 103  CO2 23 24 26 25 23   GLUCOSE 113* 143* 129* 162* 134*  BUN 25* 29* 31* 30* 27*  CREATININE 0.53 0.62 0.66 0.60 0.57  CALCIUM 8.1* 8.2* 8.4* 8.3* 8.1*  MG 2.2  --   --  2.1  --   PHOS 3.4  --   --  3.3  --    GFR: Estimated Creatinine Clearance: 45.5 mL/min (by C-G formula based on SCr of 0.57 mg/dL). Liver Function Tests: Recent Labs  Lab 01/27/19 0349 01/30/19 0345  AST 22 24  ALT 26 29  ALKPHOS 53 61  BILITOT 0.3 <0.1*  PROT 5.6* 5.5*  ALBUMIN 2.6* 2.6*   No results for input(s): LIPASE, AMYLASE in the last 168 hours. No results for input(s): AMMONIA in the last 168 hours. Coagulation Profile: No results for input(s): INR, PROTIME in the last 168 hours. Cardiac Enzymes: No results for input(s): CKTOTAL, CKMB, CKMBINDEX, TROPONINI in the last 168 hours. BNP (last 3 results) No results for input(s): PROBNP in the last 8760 hours. HbA1C: No results for input(s): HGBA1C in the last 72 hours. CBG: Recent Labs  Lab 01/30/19 0612 01/30/19 1115 01/30/19 1801 01/30/19 2336 01/31/19 0543  GLUCAP 169* 143* 162* 147* 135*   Lipid Profile: No results for input(s):  CHOL, HDL, LDLCALC, TRIG, CHOLHDL, LDLDIRECT in the last 72 hours. Thyroid Function Tests: No results for input(s): TSH, T4TOTAL, FREET4, T3FREE, THYROIDAB in the last 72 hours. Anemia Panel: No results for input(s): VITAMINB12, FOLATE, FERRITIN, TIBC, IRON, RETICCTPCT in the last 72 hours. Sepsis Labs: No results for input(s): PROCALCITON, LATICACIDVEN in the last 168 hours.  No results found for this or any previous visit (from the past 240 hour(s)).       Radiology Studies: No results found.      Scheduled Meds:  docusate sodium  100 mg Oral BID   heparin  5,000 Units Subcutaneous Q8H   insulin aspart  0-15 Units Subcutaneous Q8H   latanoprost  1 drop Both Eyes QHS   levothyroxine  50 mcg Oral Q0600   loratadine  10 mg Oral Daily   pantoprazole  40 mg Oral BID   polyethylene glycol  17 g Oral Daily   sodium chloride flush  10-40 mL Intracatheter Q12H   Continuous  Infusions:  lactated ringers 10 mL/hr at 01/30/19 1725   TPN ADULT (ION) 75 mL/hr at 01/30/19 1725   TPN ADULT (ION)       LOS: 19 days    Time spent: 35 min    Burke Keelshristopher Sheriece Jefcoat, MD [facility]  If 7PM-7AM, please contact night-coverage  01/31/2019, 2:35 PM

## 2019-01-31 NOTE — Progress Notes (Signed)
PHARMACY - ADULT TOTAL PARENTERAL NUTRITION CONSULT NOTE   Pharmacy Consult for TPN Indication: Post-operative ileus following surgery for SBO  Patient Measurements: Height: 5' 6.5" (168.9 cm) Weight: 158 lb 9 oz (71.9 kg) IBW/kg (Calculated) : 60.45 TPN AdjBW (KG): 67.6 Body mass index is 25.21 kg/m.   Vitals:   01/30/19 2138 01/31/19 0700  BP: 105/60 113/64  Pulse: 82 82  Resp: 16 16  Temp: 98.8 F (37.1 C) 98.4 F (36.9 C)  SpO2: 99% 97%    Intake/Output Summary (Last 24 hours) at 01/31/2019 0721 Last data filed at 01/31/2019 0200 Gross per 24 hour  Intake 1943.17 ml  Output 200 ml  Net 1743.17 ml    Recent Labs    01/30/19 0345 01/31/19 0344  NA 135 134*  K 4.6 4.6  CL 101 103  CO2 25 23  GLUCOSE 162* 134*  BUN 30* 27*  CREATININE 0.60 0.57  CALCIUM 8.3* 8.1*  PHOS 3.3  --   MG 2.1  --   ALBUMIN 2.6*  --   ALKPHOS 61  --   AST 24  --   ALT 29  --   BILITOT <0.1*  --   Corr Ca 6/17 = 9.2  Current Nutrition: NPO, TPN (sips of clears) IVF: LR @ 50ml/hr Central access: PICC  TPN start date: 6/10  ASSESSMENT                                                                                        HPI: 85 y/o F with SBO underwent laparotomy and lysis of adhesions on 01/14/19, now on TPN. Hx of DM (diet-controlled) noted.  Significant events:  6/14: continue TPN, start CL soon and per CCS note may start to wean TPN soon if tolerated 6/16: started clear liquids 6/17: TPN reduced to 1/2 rate per surgery 6/18: +ileus, attempting to insert NGT but pt refusing.   6/20: Increase TPN back to full dose per surgery 6/22: repeat clamping trial but later developed nausea 6/24: NG removed, starting CLD, bowel regimen 6/25: advance to FLD   Today:   Glucose (goal CBG<150)  Hx DM - diet controlled  CBGs all in goal since insulin increased in TPNat goal (138-169)   Received 7 units sensitive SSI yesterday (  Electrolytes - all stable WNL  Renal - SCr,  bicarb, UOP WNL, BUN elevated without obvious source  LFTs - stable WNL  TGs - stable WNL  Prealbumin - slightly worse 14.1 >> 12.3 (6/22)  NUTRITIONAL GOALS                                                                           KCal:    3009-2330 / day Protein: 80-95 g /day  Goal TPN rate is 75 ml/hr (provides 1854 KCal/day, 99 g protein/day, 1.8 L/day)  Keep Mg 2.0+, K 4.0+ with possible ileus  PLAN                                                                                                         At 6pm: Continue custom TPN at goal rate of 75 ml/hr; will wean TPN once tolerating > 50-60% of kcal orally, or instructed by Surgery  Increase to 17 units insulin in TPN  Increase Na, no other changes from yesterday; Cl:Ac = 1:1  TPN to contain standard multivitamins daily and trace elements MWF  Continue moderate SSI, relax to q8 hr CBG checks   IVF per MD  TPN lab panels on Mondays & Thursdays  Bmet tomorrow   Bernadene Personrew Eshaal Duby, PharmD, BCPS [phone] 01/31/2019, 7:21 AM

## 2019-02-01 LAB — BASIC METABOLIC PANEL
Anion gap: 10 (ref 5–15)
BUN: 26 mg/dL — ABNORMAL HIGH (ref 8–23)
CO2: 22 mmol/L (ref 22–32)
Calcium: 8.2 mg/dL — ABNORMAL LOW (ref 8.9–10.3)
Chloride: 100 mmol/L (ref 98–111)
Creatinine, Ser: 0.54 mg/dL (ref 0.44–1.00)
GFR calc Af Amer: >60 mL/min (ref >60)
GFR calc non Af Amer: >60 mL/min (ref >60)
Glucose, Bld: 142 mg/dL — ABNORMAL HIGH (ref 70–99)
Potassium: 4.2 mmol/L (ref 3.5–5.1)
Sodium: 132 mmol/L — ABNORMAL LOW (ref 135–145)

## 2019-02-01 LAB — GLUCOSE, CAPILLARY
Glucose-Capillary: 138 mg/dL — ABNORMAL HIGH (ref 70–99)
Glucose-Capillary: 148 mg/dL — ABNORMAL HIGH (ref 70–99)
Glucose-Capillary: 127 mg/dL — ABNORMAL HIGH (ref 70–99)

## 2019-02-01 MED ORDER — TRAZODONE HCL 50 MG PO TABS
50.0000 mg | ORAL_TABLET | Freq: Once | ORAL | Status: AC
  Administered 2019-02-01: 50 mg via ORAL
  Filled 2019-02-01: qty 1

## 2019-02-01 MED ORDER — CALCIUM CARBONATE ANTACID 500 MG PO CHEW
1.0000 | CHEWABLE_TABLET | Freq: Three times a day (TID) | ORAL | Status: DC
  Administered 2019-02-01 – 2019-02-11 (×26): 200 mg via ORAL
  Filled 2019-02-01 (×26): qty 1

## 2019-02-01 MED ORDER — BOOST / RESOURCE BREEZE PO LIQD CUSTOM
1.0000 | Freq: Three times a day (TID) | ORAL | Status: DC
  Administered 2019-02-01 (×2): 1 via ORAL

## 2019-02-01 MED ORDER — TRAVASOL 10 % IV SOLN
INTRAVENOUS | Status: AC
  Administered 2019-02-01: 18:00:00 via INTRAVENOUS
  Filled 2019-02-01: qty 990

## 2019-02-01 NOTE — Progress Notes (Signed)
    18 Days Post-Op  Subjective: CC: Reports BM this morning   Objective: Vital signs in last 24 hours: Temp:  [97.6 F (36.4 C)-98.9 F (37.2 C)] 98.2 F (36.8 C) (06/27 0136) Pulse Rate:  [75-85] 79 (06/27 0136) Resp:  [15-16] 16 (06/27 0136) BP: (107-120)/(64-70) 113/67 (06/27 0136) SpO2:  [97 %-98 %] 97 % (06/27 0136) Last BM Date: 02/01/19  Intake/Output from previous day: 06/26 0701 - 06/27 0700 In: 1935.7 [P.O.:240; I.V.:1695.7] Out: 400 [Urine:400] Intake/Output this shift: Total I/O In: 679.9 [I.V.:679.9] Out: -   PE: Gen: Awake and alert, NAD Lungs: Normal effort and rate Abd: Soft, ND, mild tenderness. Midline wound c/d/i with steri-strips over.  Msk: No edema  Lab Results:  No results for input(s): WBC, HGB, HCT, PLT in the last 72 hours. BMET Recent Labs    01/31/19 0344 02/01/19 0400  NA 134* 132*  K 4.6 4.2  CL 103 100  CO2 23 22  GLUCOSE 134* 142*  BUN 27* 26*  CREATININE 0.57 0.54  CALCIUM 8.1* 8.2*   PT/INR No results for input(s): LABPROT, INR in the last 72 hours. CMP     Component Value Date/Time   NA 132 (L) 02/01/2019 0400   K 4.2 02/01/2019 0400   CL 100 02/01/2019 0400   CO2 22 02/01/2019 0400   GLUCOSE 142 (H) 02/01/2019 0400   BUN 26 (H) 02/01/2019 0400   CREATININE 0.54 02/01/2019 0400   CALCIUM 8.2 (L) 02/01/2019 0400   PROT 5.5 (L) 01/30/2019 0345   ALBUMIN 2.6 (L) 01/30/2019 0345   AST 24 01/30/2019 0345   ALT 29 01/30/2019 0345   ALKPHOS 61 01/30/2019 0345   BILITOT <0.1 (L) 01/30/2019 0345   GFRNONAA >60 02/01/2019 0400   GFRAA >60 02/01/2019 0400   Lipase     Component Value Date/Time   LIPASE 29 01/12/2019 0928       Studies/Results: No results found.  Anti-infectives: Anti-infectives (From admission, onward)   Start     Dose/Rate Route Frequency Ordered Stop   01/14/19 1103  sodium chloride 0.9 % with cefoTEtan (CEFOTAN) ADS Med    Note to Pharmacy: [name]  : cabinet override   01/14/19 1103 01/14/19 1124   01/14/19 1045  cefoTEtan (CEFOTAN) 2 g in sodium chloride 0.9 % 100 mL IVPB     2 g 200 mL/hr over 30 Minutes Intravenous  Once 01/14/19 1044 01/14/19 1124       Assessment/Plan Thyroid disease HTN HLD DM - above per medicine team Protein Calorie Malnutrition- Pre-albumin 8.4 (6/10)>>14.1(6/15)>>12.3(6/22)  SBO S/p Ex Lap with LOA for SBO due to Omental Adhesion,internal hernia - Dr. Rosendo Gros - 01/14/2019 - POD #18 - Mobilize for bowel function - Pulm toliet - Keep K>4 and Mg >2 for bowel function -Soft diet ordered this morning. Add boost/breeze/ ensure - Continue TPN till more intake. Calorie count ordered -Narcotics discontinued, has not received any in >48h   FEN - TPN, Soft, Bowel Regimen (Miralax, Colace) VTE - Heparin, SCDs, Mobilize  ID - Cefotetan periop. None currently. Afebrile. Foley - None  Follow up- Dr. Rosendo Gros  POC - Son - Tyrone Schimke [phone]) -Patient's son and daughter plan to be with her when she returns home. Daughter during the day and son at night.   LOS: 20 days    [facility] 02/01/2019, 6:09 AM  Please see AMION to contact on-call provider if needed.

## 2019-02-01 NOTE — Progress Notes (Signed)
PHARMACY - ADULT TOTAL PARENTERAL NUTRITION CONSULT NOTE   Pharmacy Consult for TPN Indication: Post-operative ileus following surgery for SBO  Patient Measurements: Height: 5' 6.5" (168.9 cm) Weight: 158 lb 9 oz (71.9 kg) IBW/kg (Calculated) : 60.45 TPN AdjBW (KG): 67.6 Body mass index is 25.21 kg/m.   Vitals:   02/01/19 0136 02/01/19 0654  BP: 113/67 123/75  Pulse: 79 79  Resp: 16 16  Temp: 98.2 F (36.8 C) 97.9 F (36.6 C)  SpO2: 97% 99%    Intake/Output Summary (Last 24 hours) at 02/01/2019 0715 Last data filed at 02/01/2019 0600 Gross per 24 hour  Intake 2267.42 ml  Output 400 ml  Net 1867.42 ml    Recent Labs    01/30/19 0345 01/31/19 0344 02/01/19 0400  NA 135 134* 132*  K 4.6 4.6 4.2  CL 101 103 100  CO2 25 23 22   GLUCOSE 162* 134* 142*  BUN 30* 27* 26*  CREATININE 0.60 0.57 0.54  CALCIUM 8.3* 8.1* 8.2*  PHOS 3.3  --   --   MG 2.1  --   --   ALBUMIN 2.6*  --   --   ALKPHOS 61  --   --   AST 24  --   --   ALT 29  --   --   BILITOT <0.1*  --   --    Current Nutrition: NPO, TPN (sips of clears) IVF: LR @ 71ml/hr Central access: PICC  TPN start date: 6/10  ASSESSMENT                                                                                        HPI: 85 y/o F with SBO underwent laparotomy and lysis of adhesions on 01/14/19, now on TPN. Hx of DM (diet-controlled) noted.  Significant events:  6/14: continue TPN, start CL soon and per CCS note may start to wean TPN soon if tolerated 6/16: started clear liquids 6/17: TPN reduced to 1/2 rate per surgery 6/18: +ileus, attempting to insert NGT but pt refusing.   6/20: Increase TPN back to full dose per surgery 6/22: repeat clamping trial but later developed nausea 6/24: NG removed, starting CLD, bowel regimen 6/25: advance to FLD 6/27: BM, advance to soft diet, add Breeze TID; starting 48 hr calorie count  Today:   Glucose (goal CBG<150)  Hx DM - diet controlled  CBGs at goal on current  regimen (133-146)   Received 6 units sensitive SSI yesterday; 17 units in TPN  Electrolytes - all stable WNL  Renal - SCr, bicarb, UOP low or incompletely charted; BUN elevated without obvious source  LFTs - stable WNL  TGs - stable WNL  Prealbumin - slightly worse 14.1 >> 12.3 (6/22)  NUTRITIONAL GOALS  KCal:    1610-9604:    1750-2025 / day Protein: 80-95 g /day  Goal TPN rate is 75 ml/hr (provides 1854 KCal/day, 99 g protein/day, 1.8 L/day)  Keep Mg 2.0+, K 4.0+ with possible ileus  PLAN                                                                                                         At 6pm: Continue custom TPN at goal rate of 75 ml/hr; will wean TPN once tolerating > 50-60% of kcal orally, or instructed by Surgery  Increase to 20 units insulin in TPN; I expect SSI and insulin in TPN will need to be advanced further once starting Breeze d/t sugar content  Increase Na and K; no other changes from yesterday; Cl:Ac = 1:1  TPN to contain standard multivitamins daily and trace elements MWF  Continue moderate SSI, w/ q8 hr CBG checks   IVF per MD  TPN lab panels on Mondays & Thursdays  Bmet tomorrow   Bernadene Personrew Milisa Kimbell, PharmD, BCPS [phone] 02/01/2019, 7:15 AM

## 2019-02-01 NOTE — Progress Notes (Signed)
PROGRESS NOTE    Christie Hinton  RUE:[id number] DOB: Jun 01, 1928 DOA: 01/12/2019 PCP: Sharren Bridge, NP   Brief Narrative:  85 year old female who presented abdominal pain, nausea or vomiting. She does have significant past medical history for hypertension, dyslipidemia and GERD. She reported generalized abdominal pain after a meal, associated with vomiting. She presented to the hospital due to progressive abdominal pain. On her initial physical examination her temperature was 97.7, heart rate 76, blood pressure 166/82, oxygen saturation 99%. Her lungs are clear to auscultation bilaterally, heart S1-S2 present rhythmic, the abdomen was soft, generalized abdominal pain to palpation, hypoactive bowel sounds, no lower extremity edema. CT of the abdomen with a small bowel obstruction, dilated loops of small bowel in the left abdomen with mesenteric edema and a well-defined transition point.Findings concerning for closed-loop obstruction, possibly related to an internal hernia.  Patient was admitted to the hospital working diagnosis of small bowel obstruction.  Underwent exploratory laparotomy on 01/14/19, lysis of adhesions. Complicated with post operative ileus.   Very slow to recover ileuos, required NG tube to low intermittent suction.   Assessment & Plan:   Principal Problem:   SBO (small bowel obstruction) s/p lysis of adhesions 01/14/2019 Active Problems:   Essential hypertension   Mixed hyperlipidemia   1. Small bowel obstructionsp exploratory laparotomy 01/14/19, with post operative ileus.Has been difficult to wean patient from NG tubebut have successfully now.slowly improvingnausea-reported none overnight, denied bm to me also, but EMR reports 2.,Surgery following, agree with plan, continue analgesics, advancing to soft diet today, c/w tpn until po intake picks up  2. Hypokalemia, hypophosphatemia.Corrected electrolytes with K at 4,2and preserved renal  function, cr at 0,54  3. HTN.Blood pressure is controlled, systolic145mmHg, will continue to hold on antihypertensive agents.  4. Hypothyroid.On IVlevothyroxine, change to po when patient tolerating po route.No evidence of obvious hormonal dysregulation  5. Dyslipidemia.Statin therapy.  6. T2DM.Continue with sliding scale insulin check and blood glucose AC at bedtime,  DVT prophylaxis: Lovenox SQ  Code Status: full    Code Status Orders  (From admission, onward)         Start     Ordered   01/12/19 1639  Full code  Continuous     01/12/19 1639        Code Status History    This patient has a current code status but no historical code status.   Advance Care Planning Activity     Family Communication: none today Disposition Plan:   Patient remained inpatient postop day #17 status post ex lap with lysis of adhesions for small bowel obstruction. Patient very slow to recover and mobilized bowel function. Advancing diet to see if po intake sufficent to d/c tpn,  Patient unable to be discharged home until she is able to tolerate diet. Without these treatments patient did have a clinical deterioration that could be life-threatening. Consults called: None Admission status: Inpatient   Consultants:   gen surg  Procedures:  Dg Abd 1 View  Result Date: 01/16/2019 CLINICAL DATA:  Abdominal distension EXAM: ABDOMEN - 1 VIEW COMPARISON:  01/15/2019 FINDINGS: Persistent small bowel dilatation is noted consistent with at least partial small bowel obstruction. Contrast is noted within the right colon administered on prior CT examination. Postsurgical changes are seen. IMPRESSION: Stable small bowel dilatation. This may be related to postoperative ileus although persistent small-bowel obstruction deserves consideration as well. Correlation with the physical exam is recommended. Electronically Signed   By: Alcide Clever M.D.   On: 01/16/2019  10:21   Dg Abd 1  View  Result Date: 01/15/2019 CLINICAL DATA:  Small-bowel obstruction EXAM: ABDOMEN - 1 VIEW COMPARISON:  01/14/2019 FINDINGS: Persistent markedly dilated small intestine consistent with ongoing small bowel obstruction. Collapsed colon containing some contrast. IMPRESSION: Persistent small bowel obstruction pattern. No visible nasogastric tube. Electronically Signed   By: Paulina FusiMark  Shogry M.D.   On: 01/15/2019 11:20   X-ray Abdomen Ap  Result Date: 01/14/2019 CLINICAL DATA:  Nasogastric tube placement. EXAM: ABDOMEN - 1 VIEW COMPARISON:  Radiograph of same day. FINDINGS: Slightly decreased small bowel dilatation is noted. These findings remain suspicious for distal small bowel obstruction. Midline surgical staples are noted. Nasogastric tube is seen looped within probable hiatal hernia above the diaphragm. IMPRESSION: Nasogastric tube tip appears to be looped within the hiatal hernia above the diaphragm. Slightly decreased small bowel dilatation is noted as described above. Electronically Signed   By: Lupita RaiderJames  Green Jr M.D.   On: 01/14/2019 14:40   X-ray Abdomen Ap  Result Date: 01/14/2019 CLINICAL DATA:  Nasogastric tube placement. EXAM: ABDOMEN - 1 VIEW COMPARISON:  Radiograph of January 14, 2019. FINDINGS: Stable severe small bowel dilatation is noted consistent with distal small bowel obstruction. Midline surgical staples are noted. No abnormal calcifications are noted. Nasogastric tube appears to be going into the right mainstem and lower lobe bronchus. IMPRESSION: Nasogastric tube is seen going into right mainstem and lower lobe bronchus. Immediate withdrawal is recommended. Critical Value/emergent results were called by telephone at the time of interpretation on 01/14/2019 at 1:42 pm toAshura, who verbally acknowledged these results and stated that they have already removed the nasogastric tube and replaced it. Stable small bowel dilatation is noted consistent with distal small bowel obstruction. Electronically  Signed   By: Lupita RaiderJames  Green Jr M.D.   On: 01/14/2019 13:43   Ct Abdomen Pelvis W Contrast  Result Date: 01/12/2019 CLINICAL DATA:  85 year old with bowel obstruction. Acute lower abdominal pain with nausea. EXAM: CT ABDOMEN AND PELVIS WITH CONTRAST TECHNIQUE: Multidetector CT imaging of the abdomen and pelvis was performed using the standard protocol following bolus administration of intravenous contrast. CONTRAST:  100mL OMNIPAQUE IOHEXOL 300 MG/ML  SOLN COMPARISON:  None. FINDINGS: Lower chest: Lung bases are clear.  No pleural effusions. Hepatobiliary: Normal appearance of the liver. Small hypodensity in the right hepatic lobe on image 19 is probably an incidental finding. Portal venous system is patent. Normal appearance of the gallbladder without inflammatory changes. No biliary dilatation. Pancreas: Unremarkable. No pancreatic ductal dilatation or surrounding inflammatory changes. Spleen: Normal in size without focal abnormality. Adrenals/Urinary Tract: Normal adrenal glands. Urinary bladder is unremarkable but slightly limited evaluation due to artifact from left hip replacement. Normal appearance of both kidneys without hydronephrosis. Stomach/Bowel: Extensive diverticulosis in the sigmoid colon and left colon without acute colonic inflammation. Normal appendix. Moderate sized hiatal hernia. Normal appearance of the duodenum. Dilated small bowel loops in the left abdomen compatible with jejunum. There is mesenteric edema associated with these dilated small bowel loops. Small bowel loops measure up to 3.3 cm. Distal small bowel is decompressed. Small bowel transition point in the left abdomen near the mesenteric edema. Transition point best seen on the coronal reformats, sequence 5 image 42 and sagittal reformats, sequence 6, image 84. Irregularity of the mesentery and fat near the bowel obstruction and raises concern for a closed loop obstruction and possibly an internal hernia. No evidence for pneumatosis  or portal air. Vascular/Lymphatic: Atherosclerotic disease in the abdominal aorta without aneurysm. No  abdominopelvic lymphadenopathy. Reproductive: Uterus and bilateral adnexa are unremarkable. Other: Small amount of free fluid in the pelvis. Minimal fluid in the left paracolic gutter. Mesenteric edema in the left abdomen associated with the dilated small bowel loops. Negative for free air. Musculoskeletal: No acute bone abnormality in the spine. Extensive facet arthropathy. IMPRESSION: 1. Small bowel obstruction. Dilated loops of small bowel in the left abdomen with mesenteric edema and well-defined transition point. Findings raise concern for a closed loop obstruction, possibly related to an internal hernia. Small amount of fluid in the abdomen and pelvis. 2. Colonic diverticulosis without acute colonic inflammation. 3. Moderate sized hiatal hernia. These results were called by telephone at the time of interpretation on 01/12/2019 at 12:08 pm to Dr. Alvira Monday , who verbally acknowledged these results. Electronically Signed   By: Richarda Overlie M.D.   On: 01/12/2019 12:16   Dg Abd 2 Views  Result Date: 01/19/2019 CLINICAL DATA:  Recent exploratory laparotomy with lysis of adhesions for small-bowel obstruction. Abdominal distension. EXAM: ABDOMEN - 2 VIEW COMPARISON:  01/16/2019 FINDINGS: The enteric tube has been removed. Postsurgical changes are again noted with skin staples remaining in place. Moderate to severe diffuse small bowel dilatation is similar to the prior study. Gas is present in nondilated colon. Residual oral contrast material has migrated distally and is now located in the descending and sigmoid colon. No intraperitoneal free air is identified. Prior left hip arthroplasty is noted. IMPRESSION: Unchanged small bowel dilatation concerning for obstruction. Electronically Signed   By: Sebastian Ache M.D.   On: 01/19/2019 14:25   Dg Abd Portable 1v  Result Date: 01/28/2019 CLINICAL DATA:  Ileus  follow-up. EXAM: PORTABLE ABDOMEN - 1 VIEW COMPARISON:  Abdominal x-ray dated January 25, 2019. FINDINGS: Unchanged enteric tube terminating in the distal esophagus. Persistent diffuse small bowel dilatation up to 5.7 cm, minimally improved. No radio-opaque calculi or other significant radiographic abnormality are seen. IMPRESSION: 1. Persistent ileus, minimally improved. 2. Unchanged enteric tube with the tip in the distal esophagus. Recommend advancement. Electronically Signed   By: Obie Dredge M.D.   On: 01/28/2019 15:35   Dg Abd Portable 1v  Result Date: 01/25/2019 CLINICAL DATA:  Ileus EXAM: PORTABLE ABDOMEN - 1 VIEW COMPARISON:  01/24/2019 abdominal radiograph FINDINGS: Enteric tube terminates at the esophagogastric junction. Vertical skin staples overlie the medial left abdomen. Partially visualized left total hip arthroplasty. Prominent diffuse small bowel dilatation up to the 6.1 cm diameter, minimally improved. No evidence of pneumatosis or pneumoperitoneum. Minimal colonic stool. No radiopaque nephrolithiasis. IMPRESSION: 1. Enteric tube terminates at the esophagogastric junction. 2. Prominent diffuse small bowel dilatation, minimally improved, compatible with severe postoperative adynamic ileus. Electronically Signed   By: Delbert Phenix M.D.   On: 01/25/2019 07:09   Dg Abd Portable 1v  Result Date: 01/24/2019 CLINICAL DATA:  Vomiting.  Abdominal pain. EXAM: PORTABLE ABDOMEN - 1 VIEW COMPARISON:  01/23/2019.  01/19/2019. FINDINGS: Surgical staples noted over the abdomen. Persistent prominently dilated small bowel loops are again noted. Small-bowel distention may have increased from prior exam. Paucity of intra colonic air. No free air. Degenerative changes lumbar spine right hip. Total left hip replacement. IMPRESSION: Persistent prominent dilated small bowel loops are again noted. Small-bowel distention may have progressed from prior exam. Findings consistent small bowel obstruction.  Electronically Signed   By: Maisie Fus  Register   On: 01/24/2019 07:16   Dg Abd Portable 1v  Result Date: 01/23/2019 CLINICAL DATA:  Vomiting, ileus postop EXAM: PORTABLE ABDOMEN - 1  VIEW COMPARISON:  01/19/2019 FINDINGS: Marked gaseous distention of predominantly small bowel loops. Gas noted within nondistended stomach and large bowel. Appearance is concerning for small bowel obstruction. No visible free air organomegaly. IMPRESSION: Continued marked dilatation of small bowel loops concerning for persistent small bowel obstruction. Electronically Signed   By: Charlett Nose M.D.   On: 01/23/2019 19:50   Dg Abd Portable 1v  Result Date: 01/23/2019 CLINICAL DATA:  NG placement EXAM: PORTABLE ABDOMEN - 1 VIEW COMPARISON:  01/23/2019 FINDINGS: NG tip within hiatal hernia.  NG above the diaphragm. Dilated small bowel loops with mild improvement since earlier today. Skin staples in the midline of the abdomen. IMPRESSION: NG tip coiled in hiatal hernia.  Decrease small bowel dilatation. Electronically Signed   By: Marlan Palau M.D.   On: 01/23/2019 18:36   Dg Abd Portable 1v  Result Date: 01/14/2019 CLINICAL DATA:  Small-bowel obstruction EXAM: PORTABLE ABDOMEN - 1 VIEW COMPARISON:  01/13/2019 FINDINGS: Gastric catheter is noted within the stomach. Persistent and increased small-bowel dilatation is noted when compare with the previous day. Some colonic gas is noted and stable. No free air is seen. IMPRESSION: Increasing small bowel dilatation when compared with the previous day Electronically Signed   By: Alcide Clever M.D.   On: 01/14/2019 08:46   Dg Abd Portable 1v-small Bowel Obstruction Protocol-initial, 8 Hr Delay  Result Date: 01/13/2019 CLINICAL DATA:  Small-bowel obstruction. EXAM: PORTABLE ABDOMEN - 1 VIEW COMPARISON:  01/13/2019 FINDINGS: The enteric tube projects over the gastric body. The patient's oral contrast is not well appreciated on this exam and is favored to remain within the small bowel.  There are multiple dilated loops of small bowel measuring up to approximately 4.5 cm. There is no definite pneumatosis or free air. There is a moderate amount of stool in the ascending colon. The bladder appears to be partially distended with contrast. IMPRESSION: 1. Oral contrast is not well appreciated on this exam but is favored to reside within the small bowel. No definite oral contrast visualized in the colon. 2. Persistently dilated loops of small bowel measuring up to approximately 4.5 cm in diameter. 3. Enteric tube projects over the left upper quadrant. Electronically Signed   By: Katherine Mantle M.D.   On: 01/13/2019 20:00   Dg Abd Portable 1v-small Bowel Obstruction Protocol-initial, 8 Hr Delay  Result Date: 01/13/2019 CLINICAL DATA:  Small bowel obstruction. EXAM: PORTABLE ABDOMEN - 1 VIEW COMPARISON:  One-view abdomen 01/12/2019 FINDINGS: NG tube remains in place. Dilated loops of small bowel are slightly less prominent than on the prior study. Gas is present throughout the colon. Contrast is noted within the urinary bladder. Left hip replacement is evident. IMPRESSION: 1. Improving small bowel obstruction with NG tube in place. Electronically Signed   By: Marin Roberts M.D.   On: 01/13/2019 07:42   Dg Abd Portable 1v-small Bowel Protocol-position Verification  Result Date: 01/12/2019 CLINICAL DATA:  85 year old female with history of nasogastric tube placement. Evaluate for bowel obstruction. EXAM: PORTABLE ABDOMEN - 1 VIEW COMPARISON:  CT the abdomen and pelvis 01/12/2019. FINDINGS: Multiple prominent mildly dilated loops of gas-filled small bowel in the central abdomen measuring up to 3.4 cm in diameter. A small amount of gas and stool is noted in the colon. No pneumoperitoneum on this single supine view. Nasogastric tube extends into the proximal body of the stomach. Either native contrast filling the urinary bladder, related to recent contrast enhanced CT examination. Status post  left hip arthroplasty. IMPRESSION: 1.  Tip of nasogastric tube is in the proximal body of the stomach. 2. Bowel gas pattern again suggests early or partial small bowel obstruction. Electronically Signed   By: Trudie Reedaniel  Entrikin M.D.   On: 01/12/2019 19:09   Dg Vangie BickerNaso G Tube Plc W/fl W/rad  Result Date: 01/24/2019 CLINICAL DATA:  Small-bowel distention.  NG tube for decompression. EXAM: NASO G TUBE PLACEMENT WITH FL AND WITH RAD CONTRAST:  None. FLUOROSCOPY TIME:  Fluoroscopy Time:  0 minutes and 48 seconds. Radiation Exposure Index (if provided by the fluoroscopic device): 16.7 mGy Number of Acquired Spot Images: 0 COMPARISON:  Abdomen film from 01/24/2020. Prior NG tube placement 01/15/2019. FINDINGS: NG tube was placed via the patient's right nostril and easily directed into the patient's known moderate to large hiatal hernia. Multiple attempts were made to direct the NG tube through the hernia into the subdiaphragmatic portion of the stomach, but these were unsuccessful. IMPRESSION: Successful NG tube placement into the supradiaphragmatic portion of the stomach in this patient with moderate to large hiatal hernia. As before, the tube could not be directed into the infra diaphragmatic portion of the stomach due to orientation of the hiatal hernia. I discussed this study with the patient's nurse Beth, at 1451 hours on 01/24/2019. Electronically Signed   By: Kennith CenterEric  Mansell M.D.   On: 01/24/2019 14:46   Dg Intro Long Gi Tube  Result Date: 01/15/2019 CLINICAL DATA:  Small bowel obstruction.  Recent lysis of adhesions. EXAM: INTRO LONG GI TUBE CONTRAST:  50mL OMNIPAQUE IOHEXOL 300 MG/ML  SOLN FLUOROSCOPY TIME:  Fluoroscopy Time:  12 Radiation Exposure Index (if provided by the fluoroscopic device): 203.4 mGy Number of Acquired Spot Images: 1 COMPARISON:  CT 01/12/2019 FINDINGS: The patient arrived with new G-tube in the distal esophagus. Patient has a large hiatal hernia. Multiple attempts were made to pass the NG  tube through the hiatal hernia. Contrast and air were administered into the hernia/stomach. A guidewire was employed. Patient was repositioned in multiple position. Ultimately, the tip of the NG tube could not be passed into the stomach due to the orientation of the orifice of the hiatal hernia. The gastric contents and administer contrast were aspirated easily back out of the hernia sac and stomach. IMPRESSION: NG tube position within the large hiatal hernia. Tube could not be passed into the proximal stomach due to the anatomy of the hernia. The tube will likely decompress the stomach in the current position. Findings conveyed topatient's floor nurse Tracyon 01/15/2019 at15:29. Electronically Signed   By: Genevive BiStewart  Edmunds M.D.   On: 01/15/2019 16:30   [place] Ekg Site Rite  Result Date: 01/14/2019 If Site Rite image not attached, placement could not be confirmed due to current cardiac rhythm.    Antimicrobials:   None     Subjective: Reports feeling somewhat better this morning, advancing diet to soft diet Ambulated to bathroom with assistance this morning appears to be stronger more stable  Objective: Vitals:   01/31/19 1800 01/31/19 2027 02/01/19 0136 02/01/19 0654  BP: 120/70 107/65 113/67 123/75  Pulse: 82 75 79 79  Resp: 15 16 16 16   Temp: 98.9 F (37.2 C) 98.4 F (36.9 C) 98.2 F (36.8 C) 97.9 F (36.6 C)  TempSrc: Oral Oral Oral Oral  SpO2: 98% 98% 97% 99%  Weight:      Height:        Intake/Output Summary (Last 24 hours) at 02/01/2019 1416 Last data filed at 02/01/2019 1000 Gross per 24 hour  Intake 2087.42 ml  Output 400 ml  Net 1687.42 ml   Filed Weights   01/12/19 0929 01/21/19 1215  Weight: 67.6 kg 71.9 kg    Examination:  General exam:no changes, appears calm and comfortable NG tube in place Respiratory system: Clear to auscultation. Respiratory effort normal. Cardiovascular system:S1 &S2 heard, RRR. No JVD, murmurs, rubs, gallops or clicks. No pedal  edema. Gastrointestinal system:Quiet bowel sounds, soft mildly tender to palpation diffusely no focal findings, no rebound no guarding not rigid Central nervous system:Alert and oriented. No focal neurological deficits. Extremities:Moves all 4 extremities freely no focal neurological deficits Skin: No rashes, lesions or ulcers Psychiatry:Judgement and insight appear normal. Mood &affect appropriate.     Data Reviewed: I have personally reviewed following labs and imaging studies  CBC: Recent Labs  Lab 01/27/19 0349 01/29/19 0410  WBC 6.6 6.3  NEUTROABS 4.6 4.3  HGB 9.4* 9.3*  HCT 31.9* 31.8*  MCV 84.4 85.7  PLT 336 409   Basic Metabolic Panel: Recent Labs  Lab 01/27/19 0349 01/28/19 0427 01/29/19 0410 01/30/19 0345 01/31/19 0344 02/01/19 0400  NA 135 135 137 135 134* 132*  K 4.5 4.5 4.6 4.6 4.6 4.2  CL 103 103 103 101 103 100  CO2 23 24 26 25 23 22   GLUCOSE 113* 143* 129* 162* 134* 142*  BUN 25* 29* 31* 30* 27* 26*  CREATININE 0.53 0.62 0.66 0.60 0.57 0.54  CALCIUM 8.1* 8.2* 8.4* 8.3* 8.1* 8.2*  MG 2.2  --   --  2.1  --   --   PHOS 3.4  --   --  3.3  --   --    GFR: Estimated Creatinine Clearance: 45.5 mL/min (by C-G formula based on SCr of 0.54 mg/dL). Liver Function Tests: Recent Labs  Lab 01/27/19 0349 01/30/19 0345  AST 22 24  ALT 26 29  ALKPHOS 53 61  BILITOT 0.3 <0.1*  PROT 5.6* 5.5*  ALBUMIN 2.6* 2.6*   No results for input(s): LIPASE, AMYLASE in the last 168 hours. No results for input(s): AMMONIA in the last 168 hours. Coagulation Profile: No results for input(s): INR, PROTIME in the last 168 hours. Cardiac Enzymes: No results for input(s): CKTOTAL, CKMB, CKMBINDEX, TROPONINI in the last 168 hours. BNP (last 3 results) No results for input(s): PROBNP in the last 8760 hours. HbA1C: No results for input(s): HGBA1C in the last 72 hours. CBG: Recent Labs  Lab 01/30/19 2336 01/31/19 0543 01/31/19 1514 01/31/19 2212 02/01/19 0645   GLUCAP 147* 135* 146* 133* 138*   Lipid Profile: No results for input(s): CHOL, HDL, LDLCALC, TRIG, CHOLHDL, LDLDIRECT in the last 72 hours. Thyroid Function Tests: No results for input(s): TSH, T4TOTAL, FREET4, T3FREE, THYROIDAB in the last 72 hours. Anemia Panel: No results for input(s): VITAMINB12, FOLATE, FERRITIN, TIBC, IRON, RETICCTPCT in the last 72 hours. Sepsis Labs: No results for input(s): PROCALCITON, LATICACIDVEN in the last 168 hours.  No results found for this or any previous visit (from the past 240 hour(s)).       Radiology Studies: No results found.      Scheduled Meds:  docusate sodium  100 mg Oral BID   feeding supplement  1 Container Oral TID BM   heparin  5,000 Units Subcutaneous Q8H   insulin aspart  0-15 Units Subcutaneous Q8H   latanoprost  1 drop Both Eyes QHS   levothyroxine  50 mcg Oral Q0600   loratadine  10 mg Oral Daily   pantoprazole  40 mg  Oral BID   polyethylene glycol  17 g Oral Daily   sodium chloride flush  10-40 mL Intracatheter Q12H   Continuous Infusions:  lactated ringers 10 mL/hr at 01/31/19 1800   TPN ADULT (ION) 75 mL/hr at 01/31/19 1800   TPN ADULT (ION)       LOS: 20 days    Time spent: 35 min     Burke Keelshristopher Noam Franzen, MD [facility]  If 7PM-7AM, please contact night-coverage  02/01/2019, 2:16 PM

## 2019-02-01 NOTE — Plan of Care (Signed)
Patient lying in bed this morning. No complaints or concerns noted. Pain controlled.

## 2019-02-02 LAB — BASIC METABOLIC PANEL
Anion gap: 8 (ref 5–15)
BUN: 27 mg/dL — ABNORMAL HIGH (ref 8–23)
CO2: 25 mmol/L (ref 22–32)
Calcium: 8.2 mg/dL — ABNORMAL LOW (ref 8.9–10.3)
Chloride: 101 mmol/L (ref 98–111)
Creatinine, Ser: 0.58 mg/dL (ref 0.44–1.00)
GFR calc Af Amer: >60 mL/min (ref >60)
GFR calc non Af Amer: >60 mL/min (ref >60)
Glucose, Bld: 143 mg/dL — ABNORMAL HIGH (ref 70–99)
Potassium: 4.3 mmol/L (ref 3.5–5.1)
Sodium: 134 mmol/L — ABNORMAL LOW (ref 135–145)

## 2019-02-02 LAB — GLUCOSE, CAPILLARY
Glucose-Capillary: 137 mg/dL — ABNORMAL HIGH (ref 70–99)
Glucose-Capillary: 157 mg/dL — ABNORMAL HIGH (ref 70–99)
Glucose-Capillary: 146 mg/dL — ABNORMAL HIGH (ref 70–99)

## 2019-02-02 MED ORDER — BOOST / RESOURCE BREEZE PO LIQD CUSTOM
1.0000 | Freq: Four times a day (QID) | ORAL | Status: DC
  Administered 2019-02-02 (×4): 1 via ORAL

## 2019-02-02 MED ORDER — TRAVASOL 10 % IV SOLN
INTRAVENOUS | Status: AC
  Administered 2019-02-02: 18:00:00 via INTRAVENOUS
  Filled 2019-02-02: qty 990

## 2019-02-02 NOTE — Progress Notes (Signed)
    19 Days Post-Op  Subjective: CC: No complaints. Reports small bm yesterday. Not eating much.  Objective: Vital signs in last 24 hours: Temp:  [97.8 F (36.6 C)-98.5 F (36.9 C)] 98.5 F (36.9 C) (06/28 0413) Pulse Rate:  [84-90] 90 (06/28 0413) Resp:  [19-20] 19 (06/28 0413) BP: (105-128)/(62-71) 105/62 (06/28 0413) SpO2:  [97 %-98 %] 97 % (06/28 0413) Last BM Date: 02/01/19  Intake/Output from previous day: 06/27 0701 - 06/28 0700 In: 1896.7 [P.O.:180; I.V.:1716.7] Out: 0  Intake/Output this shift: No intake/output data recorded.  PE: Gen: Awake and alert, NAD Lungs: Normal effort and rate Abd: Soft, ND, mild tenderness. Midline wound c/d/i with steri-strips over.  Msk: No edema  Lab Results:  No results for input(s): WBC, HGB, HCT, PLT in the last 72 hours. BMET Recent Labs    02/01/19 0400 02/02/19 0357  NA 132* 134*  K 4.2 4.3  CL 100 101  CO2 22 25  GLUCOSE 142* 143*  BUN 26* 27*  CREATININE 0.54 0.58  CALCIUM 8.2* 8.2*   PT/INR No results for input(s): LABPROT, INR in the last 72 hours. CMP     Component Value Date/Time   NA 134 (L) 02/02/2019 0357   K 4.3 02/02/2019 0357   CL 101 02/02/2019 0357   CO2 25 02/02/2019 0357   GLUCOSE 143 (H) 02/02/2019 0357   BUN 27 (H) 02/02/2019 0357   CREATININE 0.58 02/02/2019 0357   CALCIUM 8.2 (L) 02/02/2019 0357   PROT 5.5 (L) 01/30/2019 0345   ALBUMIN 2.6 (L) 01/30/2019 0345   AST 24 01/30/2019 0345   ALT 29 01/30/2019 0345   ALKPHOS 61 01/30/2019 0345   BILITOT <0.1 (L) 01/30/2019 0345   GFRNONAA >60 02/02/2019 0357   GFRAA >60 02/02/2019 0357   Lipase     Component Value Date/Time   LIPASE 29 01/12/2019 0928       Studies/Results: No results found.  Anti-infectives: Anti-infectives (From admission, onward)   Start     Dose/Rate Route Frequency Ordered Stop   01/14/19 1103  sodium chloride 0.9 % with cefoTEtan (CEFOTAN) ADS Med    Note to Pharmacy: [name]  : cabinet  override      01/14/19 1103 01/14/19 1124   01/14/19 1045  cefoTEtan (CEFOTAN) 2 g in sodium chloride 0.9 % 100 mL IVPB     2 g 200 mL/hr over 30 Minutes Intravenous  Once 01/14/19 1044 01/14/19 1124       Assessment/Plan Thyroid disease HTN HLD DM - above per medicine team Protein Calorie Malnutrition- Pre-albumin 8.4 (6/10)>>14.1(6/15)>>12.3(6/22)  SBO S/p Ex Lap with LOA for SBO due to Omental Adhesion,internal hernia - Dr. Rosendo Gros - 01/14/2019 - POD #18 - Mobilize for bowel function - Pulm toliet - Keep K>4 and Mg >2 for bowel function -Soft diet. Increased boost to QID - Continue TPN till more intake. Calorie count pending -Narcotics discontinued, has not received any in >48h   FEN - TPN, Soft, Bowel Regimen (Miralax, Colace) VTE - Heparin, SCDs, Mobilize  ID - Cefotetan periop. None currently. Afebrile. Foley - None  Follow up- Dr. Rosendo Gros  POC - Son - Tyrone Schimke [phone]) -Patient's son and daughter plan to be with her when she returns home. Daughter during the day and son at night.   LOS: 21 days    [facility] 02/02/2019, 7:33 AM

## 2019-02-02 NOTE — Plan of Care (Signed)
Patient lying in bed this morning; no pain or nausea. Complains of indigestion. Will continue to monitor.

## 2019-02-02 NOTE — Progress Notes (Signed)
PHARMACY - ADULT TOTAL PARENTERAL NUTRITION CONSULT NOTE   Pharmacy Consult for TPN Indication: Post-operative ileus following surgery for SBO  Patient Measurements: Height: 5' 6.5" (168.9 cm) Weight: 158 lb 9 oz (71.9 kg) IBW/kg (Calculated) : 60.45 TPN AdjBW (KG): 67.6 Body mass index is 25.21 kg/m.   Vitals:   02/02/19 0117 02/02/19 0413  BP: 127/71 105/62  Pulse: 89 90  Resp: 20 19  Temp: 97.8 F (36.6 C) 98.5 F (36.9 C)  SpO2: 97% 97%    Intake/Output Summary (Last 24 hours) at 02/02/2019 0710 Last data filed at 02/02/2019 0600 Gross per 24 hour  Intake 1896.73 ml  Output 0 ml  Net 1896.73 ml    Recent Labs    02/01/19 0400 02/02/19 0357  NA 132* 134*  K 4.2 4.3  CL 100 101  CO2 22 25  GLUCOSE 142* 143*  BUN 26* 27*  CREATININE 0.54 0.58  CALCIUM 8.2* 8.2*   Current Nutrition: Soft diet, TPN, took 2 of 3 Breeze drinks yesterday (500 kcal, 18 g protein total) IVF: LR @ 7310ml/hr Central access: PICC  TPN start date: 6/10  ASSESSMENT                                                                                        HPI: 85 y/o F with SBO underwent laparotomy and lysis of adhesions on 01/14/19, now on TPN. Hx of DM (diet-controlled) noted.  Significant events:  6/14: continue TPN, start CL soon and per CCS note may start to wean TPN soon if tolerated 6/16: started clear liquids 6/17: TPN reduced to 1/2 rate per surgery 6/18: +ileus, attempting to insert NGT but pt refusing.   6/20: Increase TPN back to full dose per surgery 6/22: repeat clamping trial but later developed nausea 6/24: NG removed, starting CLD, bowel regimen 6/25: advance to FLD 6/27: BM, advance to soft diet, add Breeze TID; starting 48 hr calorie count   Today:   Glucose (goal CBG<150)  Hx DM - diet controlled  CBGs at goal on current regimen (127-148)   Received 6 units sensitive SSI yesterday; 20 units in TPN  Electrolytes - Na slightly low but improving; others stable  WNL  Renal - SCr, bicarb, UOP low or incompletely charted; BUN elevated without obvious source  LFTs - stable WNL  TGs - stable WNL  Prealbumin - slightly worse 14.1 >> 12.3 (6/22)  NUTRITIONAL GOALS                                                                           KCal:    [id number] / day Protein: 80-95 g /day  Keep Mg 2.0+, K 4.0+ with possible ileus  PLAN  At 6pm:  Continue custom TPN at goal rate of 75 ml/hr; f/u calorie count and will wean TPN once tolerating > 50-60% of kcal orally, or instructed by Surgery  TPN at 75 ml/hr provides 1854 kcal, 99 g protein daily  Continue 20 units insulin in TPN  Electrolytes: no changes from yesterday; Cl:Ac = 1:1  TPN to contain standard multivitamins daily and trace elements MWF  Continue moderate SSI, w/ q8 hr CBG checks   IVF per MD  TPN lab panels on Mondays & Thursdays   Reuel Boom, PharmD, BCPS [phone] 02/02/2019, 7:10 AM

## 2019-02-02 NOTE — Progress Notes (Signed)
PROGRESS NOTE    Christie ChampionMarian Sunderlin  NWG:[id number] DOB: 18-Feb-1928 DOA: 01/12/2019 PCP: Sharren BridgeSellers, Billie H, NP   Brief Narrative:  85 year old female who presented abdominal pain, nausea or vomiting. She does have significant past medical history for hypertension, dyslipidemia and GERD. She reported generalized abdominal pain after a meal, associated with vomiting. She presented to the hospital due to progressive abdominal pain. On her initial physical examination her temperature was 97.7, heart rate 76, blood pressure 166/82, oxygen saturation 99%. Her lungs are clear to auscultation bilaterally, heart S1-S2 present rhythmic, the abdomen was soft, generalized abdominal pain to palpation, hypoactive bowel sounds, no lower extremity edema. CT of the abdomen with a small bowel obstruction, dilated loops of small bowel in the left abdomen with mesenteric edema and a well-defined transition point.Findings concerning for closed-loop obstruction, possibly related to an internal hernia.  Patient was admitted to the hospital working diagnosis of small bowel obstruction.  Underwent exploratory laparotomy on 01/14/19, lysis of adhesions. Complicated with post operative ileus.   Very slow to recover ileuos, required NG tube to low intermittent suction.   Assessment & Plan:   Principal Problem:   SBO (small bowel obstruction) s/p lysis of adhesions 01/14/2019 Active Problems:   Essential hypertension   Mixed hyperlipidemia   1. Small bowel obstructionsp exploratory laparotomy 01/14/19, with post operative ileus.improvingnausea-reported none overnight,poor po intake-calorie count, Surgery following, agree with plan, continue analgesics,advancing to soft diettoday, c/w tpnuntil po intake picks up  2. Hypokalemia, hypophosphatemia.Corrected electrolytes with K at 4,3and preserved renal function, cr at 0,58  3. HTN.Blood pressure is controlled, systolic14705mmHg, will continue to hold  on antihypertensive agents.  4. Hypothyroid.On IVlevothyroxine, likely tomorrow will change to po when patient tolerating po route.No evidence of obvious hormonal dysregulation  5. Dyslipidemia.Statin therapy.  6. T2DM.Continue with sliding scale insulin check and blood glucose AC at bedtime,  DVT prophylaxis: Lovenox SQ  Code Status: full    Code Status Orders  (From admission, onward)         Start     Ordered   01/12/19 1639  Full code  Continuous     01/12/19 1639        Code Status History    This patient has a current code status but no historical code status.   Advance Care Planning Activity     Family Communication: discussed with son this am  Disposition Plan:    Patient remained inpatient postop day #17status post ex lap with lysis of adhesions for small bowel obstruction. Patient very slow to recover and mobilized bowel function. Advancingdietto see if po intake sufficent to d/c tpn,Patientunable tobe discharged home until she is able to tolerate diet. Without these treatments patient did have a clinical deterioration that could be life-threatening. Consults called: None Consults called: None Admission status: Inpatient   Consultants:   gen surg  Procedures:  Dg Abd 1 View  Result Date: 01/16/2019 CLINICAL DATA:  Abdominal distension EXAM: ABDOMEN - 1 VIEW COMPARISON:  01/15/2019 FINDINGS: Persistent small bowel dilatation is noted consistent with at least partial small bowel obstruction. Contrast is noted within the right colon administered on prior CT examination. Postsurgical changes are seen. IMPRESSION: Stable small bowel dilatation. This may be related to postoperative ileus although persistent small-bowel obstruction deserves consideration as well. Correlation with the physical exam is recommended. Electronically Signed   By: Alcide CleverMark  Lukens M.D.   On: 01/16/2019 10:21   Dg Abd 1 View  Result Date: 01/15/2019 CLINICAL DATA:   Small-bowel  obstruction EXAM: ABDOMEN - 1 VIEW COMPARISON:  01/14/2019 FINDINGS: Persistent markedly dilated small intestine consistent with ongoing small bowel obstruction. Collapsed colon containing some contrast. IMPRESSION: Persistent small bowel obstruction pattern. No visible nasogastric tube. Electronically Signed   By: Paulina FusiMark  Shogry M.D.   On: 01/15/2019 11:20   X-ray Abdomen Ap  Result Date: 01/14/2019 CLINICAL DATA:  Nasogastric tube placement. EXAM: ABDOMEN - 1 VIEW COMPARISON:  Radiograph of same day. FINDINGS: Slightly decreased small bowel dilatation is noted. These findings remain suspicious for distal small bowel obstruction. Midline surgical staples are noted. Nasogastric tube is seen looped within probable hiatal hernia above the diaphragm. IMPRESSION: Nasogastric tube tip appears to be looped within the hiatal hernia above the diaphragm. Slightly decreased small bowel dilatation is noted as described above. Electronically Signed   By: Lupita RaiderJames  Green Jr M.D.   On: 01/14/2019 14:40   X-ray Abdomen Ap  Result Date: 01/14/2019 CLINICAL DATA:  Nasogastric tube placement. EXAM: ABDOMEN - 1 VIEW COMPARISON:  Radiograph of January 14, 2019. FINDINGS: Stable severe small bowel dilatation is noted consistent with distal small bowel obstruction. Midline surgical staples are noted. No abnormal calcifications are noted. Nasogastric tube appears to be going into the right mainstem and lower lobe bronchus. IMPRESSION: Nasogastric tube is seen going into right mainstem and lower lobe bronchus. Immediate withdrawal is recommended. Critical Value/emergent results were called by telephone at the time of interpretation on 01/14/2019 at 1:42 pm toAshura, who verbally acknowledged these results and stated that they have already removed the nasogastric tube and replaced it. Stable small bowel dilatation is noted consistent with distal small bowel obstruction. Electronically Signed   By: Lupita RaiderJames  Green Jr M.D.   On:  01/14/2019 13:43   Ct Abdomen Pelvis W Contrast  Result Date: 01/12/2019 CLINICAL DATA:  85 year old with bowel obstruction. Acute lower abdominal pain with nausea. EXAM: CT ABDOMEN AND PELVIS WITH CONTRAST TECHNIQUE: Multidetector CT imaging of the abdomen and pelvis was performed using the standard protocol following bolus administration of intravenous contrast. CONTRAST:  100mL OMNIPAQUE IOHEXOL 300 MG/ML  SOLN COMPARISON:  None. FINDINGS: Lower chest: Lung bases are clear.  No pleural effusions. Hepatobiliary: Normal appearance of the liver. Small hypodensity in the right hepatic lobe on image 19 is probably an incidental finding. Portal venous system is patent. Normal appearance of the gallbladder without inflammatory changes. No biliary dilatation. Pancreas: Unremarkable. No pancreatic ductal dilatation or surrounding inflammatory changes. Spleen: Normal in size without focal abnormality. Adrenals/Urinary Tract: Normal adrenal glands. Urinary bladder is unremarkable but slightly limited evaluation due to artifact from left hip replacement. Normal appearance of both kidneys without hydronephrosis. Stomach/Bowel: Extensive diverticulosis in the sigmoid colon and left colon without acute colonic inflammation. Normal appendix. Moderate sized hiatal hernia. Normal appearance of the duodenum. Dilated small bowel loops in the left abdomen compatible with jejunum. There is mesenteric edema associated with these dilated small bowel loops. Small bowel loops measure up to 3.3 cm. Distal small bowel is decompressed. Small bowel transition point in the left abdomen near the mesenteric edema. Transition point best seen on the coronal reformats, sequence 5 image 42 and sagittal reformats, sequence 6, image 84. Irregularity of the mesentery and fat near the bowel obstruction and raises concern for a closed loop obstruction and possibly an internal hernia. No evidence for pneumatosis or portal air. Vascular/Lymphatic:  Atherosclerotic disease in the abdominal aorta without aneurysm. No abdominopelvic lymphadenopathy. Reproductive: Uterus and bilateral adnexa are unremarkable. Other: Small amount of free fluid in  the pelvis. Minimal fluid in the left paracolic gutter. Mesenteric edema in the left abdomen associated with the dilated small bowel loops. Negative for free air. Musculoskeletal: No acute bone abnormality in the spine. Extensive facet arthropathy. IMPRESSION: 1. Small bowel obstruction. Dilated loops of small bowel in the left abdomen with mesenteric edema and well-defined transition point. Findings raise concern for a closed loop obstruction, possibly related to an internal hernia. Small amount of fluid in the abdomen and pelvis. 2. Colonic diverticulosis without acute colonic inflammation. 3. Moderate sized hiatal hernia. These results were called by telephone at the time of interpretation on 01/12/2019 at 12:08 pm to Dr. Gareth Morgan , who verbally acknowledged these results. Electronically Signed   By: Markus Daft M.D.   On: 01/12/2019 12:16   Dg Abd 2 Views  Result Date: 01/19/2019 CLINICAL DATA:  Recent exploratory laparotomy with lysis of adhesions for small-bowel obstruction. Abdominal distension. EXAM: ABDOMEN - 2 VIEW COMPARISON:  01/16/2019 FINDINGS: The enteric tube has been removed. Postsurgical changes are again noted with skin staples remaining in place. Moderate to severe diffuse small bowel dilatation is similar to the prior study. Gas is present in nondilated colon. Residual oral contrast material has migrated distally and is now located in the descending and sigmoid colon. No intraperitoneal free air is identified. Prior left hip arthroplasty is noted. IMPRESSION: Unchanged small bowel dilatation concerning for obstruction. Electronically Signed   By: Logan Bores M.D.   On: 01/19/2019 14:25   Dg Abd Portable 1v  Result Date: 01/28/2019 CLINICAL DATA:  Ileus follow-up. EXAM: PORTABLE ABDOMEN - 1  VIEW COMPARISON:  Abdominal x-ray dated January 25, 2019. FINDINGS: Unchanged enteric tube terminating in the distal esophagus. Persistent diffuse small bowel dilatation up to 5.7 cm, minimally improved. No radio-opaque calculi or other significant radiographic abnormality are seen. IMPRESSION: 1. Persistent ileus, minimally improved. 2. Unchanged enteric tube with the tip in the distal esophagus. Recommend advancement. Electronically Signed   By: Titus Dubin M.D.   On: 01/28/2019 15:35   Dg Abd Portable 1v  Result Date: 01/25/2019 CLINICAL DATA:  Ileus EXAM: PORTABLE ABDOMEN - 1 VIEW COMPARISON:  01/24/2019 abdominal radiograph FINDINGS: Enteric tube terminates at the esophagogastric junction. Vertical skin staples overlie the medial left abdomen. Partially visualized left total hip arthroplasty. Prominent diffuse small bowel dilatation up to the 6.1 cm diameter, minimally improved. No evidence of pneumatosis or pneumoperitoneum. Minimal colonic stool. No radiopaque nephrolithiasis. IMPRESSION: 1. Enteric tube terminates at the esophagogastric junction. 2. Prominent diffuse small bowel dilatation, minimally improved, compatible with severe postoperative adynamic ileus. Electronically Signed   By: Ilona Sorrel M.D.   On: 01/25/2019 07:09   Dg Abd Portable 1v  Result Date: 01/24/2019 CLINICAL DATA:  Vomiting.  Abdominal pain. EXAM: PORTABLE ABDOMEN - 1 VIEW COMPARISON:  01/23/2019.  01/19/2019. FINDINGS: Surgical staples noted over the abdomen. Persistent prominently dilated small bowel loops are again noted. Small-bowel distention may have increased from prior exam. Paucity of intra colonic air. No free air. Degenerative changes lumbar spine right hip. Total left hip replacement. IMPRESSION: Persistent prominent dilated small bowel loops are again noted. Small-bowel distention may have progressed from prior exam. Findings consistent small bowel obstruction. Electronically Signed   By: Marcello Moores  Register   On:  01/24/2019 07:16   Dg Abd Portable 1v  Result Date: 01/23/2019 CLINICAL DATA:  Vomiting, ileus postop EXAM: PORTABLE ABDOMEN - 1 VIEW COMPARISON:  01/19/2019 FINDINGS: Marked gaseous distention of predominantly small bowel loops. Gas noted within  nondistended stomach and large bowel. Appearance is concerning for small bowel obstruction. No visible free air organomegaly. IMPRESSION: Continued marked dilatation of small bowel loops concerning for persistent small bowel obstruction. Electronically Signed   By: Charlett Nose M.D.   On: 01/23/2019 19:50   Dg Abd Portable 1v  Result Date: 01/23/2019 CLINICAL DATA:  NG placement EXAM: PORTABLE ABDOMEN - 1 VIEW COMPARISON:  01/23/2019 FINDINGS: NG tip within hiatal hernia.  NG above the diaphragm. Dilated small bowel loops with mild improvement since earlier today. Skin staples in the midline of the abdomen. IMPRESSION: NG tip coiled in hiatal hernia.  Decrease small bowel dilatation. Electronically Signed   By: Marlan Palau M.D.   On: 01/23/2019 18:36   Dg Abd Portable 1v  Result Date: 01/14/2019 CLINICAL DATA:  Small-bowel obstruction EXAM: PORTABLE ABDOMEN - 1 VIEW COMPARISON:  01/13/2019 FINDINGS: Gastric catheter is noted within the stomach. Persistent and increased small-bowel dilatation is noted when compare with the previous day. Some colonic gas is noted and stable. No free air is seen. IMPRESSION: Increasing small bowel dilatation when compared with the previous day Electronically Signed   By: Alcide Clever M.D.   On: 01/14/2019 08:46   Dg Abd Portable 1v-small Bowel Obstruction Protocol-initial, 8 Hr Delay  Result Date: 01/13/2019 CLINICAL DATA:  Small-bowel obstruction. EXAM: PORTABLE ABDOMEN - 1 VIEW COMPARISON:  01/13/2019 FINDINGS: The enteric tube projects over the gastric body. The patient's oral contrast is not well appreciated on this exam and is favored to remain within the small bowel. There are multiple dilated loops of small bowel  measuring up to approximately 4.5 cm. There is no definite pneumatosis or free air. There is a moderate amount of stool in the ascending colon. The bladder appears to be partially distended with contrast. IMPRESSION: 1. Oral contrast is not well appreciated on this exam but is favored to reside within the small bowel. No definite oral contrast visualized in the colon. 2. Persistently dilated loops of small bowel measuring up to approximately 4.5 cm in diameter. 3. Enteric tube projects over the left upper quadrant. Electronically Signed   By: Katherine Mantle M.D.   On: 01/13/2019 20:00   Dg Abd Portable 1v-small Bowel Obstruction Protocol-initial, 8 Hr Delay  Result Date: 01/13/2019 CLINICAL DATA:  Small bowel obstruction. EXAM: PORTABLE ABDOMEN - 1 VIEW COMPARISON:  One-view abdomen 01/12/2019 FINDINGS: NG tube remains in place. Dilated loops of small bowel are slightly less prominent than on the prior study. Gas is present throughout the colon. Contrast is noted within the urinary bladder. Left hip replacement is evident. IMPRESSION: 1. Improving small bowel obstruction with NG tube in place. Electronically Signed   By: Marin Roberts M.D.   On: 01/13/2019 07:42   Dg Abd Portable 1v-small Bowel Protocol-position Verification  Result Date: 01/12/2019 CLINICAL DATA:  85 year old female with history of nasogastric tube placement. Evaluate for bowel obstruction. EXAM: PORTABLE ABDOMEN - 1 VIEW COMPARISON:  CT the abdomen and pelvis 01/12/2019. FINDINGS: Multiple prominent mildly dilated loops of gas-filled small bowel in the central abdomen measuring up to 3.4 cm in diameter. A small amount of gas and stool is noted in the colon. No pneumoperitoneum on this single supine view. Nasogastric tube extends into the proximal body of the stomach. Either native contrast filling the urinary bladder, related to recent contrast enhanced CT examination. Status post left hip arthroplasty. IMPRESSION: 1. Tip of  nasogastric tube is in the proximal body of the stomach. 2. Bowel gas pattern  again suggests early or partial small bowel obstruction. Electronically Signed   By: Trudie Reedaniel  Entrikin M.D.   On: 01/12/2019 19:09   Dg Vangie BickerNaso G Tube Plc W/fl W/rad  Result Date: 01/24/2019 CLINICAL DATA:  Small-bowel distention.  NG tube for decompression. EXAM: NASO G TUBE PLACEMENT WITH FL AND WITH RAD CONTRAST:  None. FLUOROSCOPY TIME:  Fluoroscopy Time:  0 minutes and 48 seconds. Radiation Exposure Index (if provided by the fluoroscopic device): 16.7 mGy Number of Acquired Spot Images: 0 COMPARISON:  Abdomen film from 01/24/2020. Prior NG tube placement 01/15/2019. FINDINGS: NG tube was placed via the patient's right nostril and easily directed into the patient's known moderate to large hiatal hernia. Multiple attempts were made to direct the NG tube through the hernia into the subdiaphragmatic portion of the stomach, but these were unsuccessful. IMPRESSION: Successful NG tube placement into the supradiaphragmatic portion of the stomach in this patient with moderate to large hiatal hernia. As before, the tube could not be directed into the infra diaphragmatic portion of the stomach due to orientation of the hiatal hernia. I discussed this study with the patient's nurse Beth, at 1451 hours on 01/24/2019. Electronically Signed   By: Kennith CenterEric  Mansell M.D.   On: 01/24/2019 14:46   Dg Intro Long Gi Tube  Result Date: 01/15/2019 CLINICAL DATA:  Small bowel obstruction.  Recent lysis of adhesions. EXAM: INTRO LONG GI TUBE CONTRAST:  50mL OMNIPAQUE IOHEXOL 300 MG/ML  SOLN FLUOROSCOPY TIME:  Fluoroscopy Time:  12 Radiation Exposure Index (if provided by the fluoroscopic device): 203.4 mGy Number of Acquired Spot Images: 1 COMPARISON:  CT 01/12/2019 FINDINGS: The patient arrived with new G-tube in the distal esophagus. Patient has a large hiatal hernia. Multiple attempts were made to pass the NG tube through the hiatal hernia. Contrast and  air were administered into the hernia/stomach. A guidewire was employed. Patient was repositioned in multiple position. Ultimately, the tip of the NG tube could not be passed into the stomach due to the orientation of the orifice of the hiatal hernia. The gastric contents and administer contrast were aspirated easily back out of the hernia sac and stomach. IMPRESSION: NG tube position within the large hiatal hernia. Tube could not be passed into the proximal stomach due to the anatomy of the hernia. The tube will likely decompress the stomach in the current position. Findings conveyed topatient's floor nurse Tracyon 01/15/2019 at15:29. Electronically Signed   By: Genevive BiStewart  Edmunds M.D.   On: 01/15/2019 16:30   [place] Ekg Site Rite  Result Date: 01/14/2019 If Site Rite image not attached, placement could not be confirmed due to current cardiac rhythm.    Antimicrobials:   none    Subjective: Decreased appetite, denies nausea or vomiting to me this morning  Objective: Vitals:   02/01/19 0654 02/01/19 2043 02/02/19 0117 02/02/19 0413  BP: 123/75 128/71 127/71 105/62  Pulse: 79 84 89 90  Resp: 16 20 20 19   Temp: 97.9 F (36.6 C) 98.4 F (36.9 C) 97.8 F (36.6 C) 98.5 F (36.9 C)  TempSrc: Oral Oral Oral Oral  SpO2: 99% 98% 97% 97%  Weight:      Height:        Intake/Output Summary (Last 24 hours) at 02/02/2019 1244 Last data filed at 02/02/2019 0600 Gross per 24 hour  Intake 1503.29 ml  Output --  Net 1503.29 ml   Filed Weights   01/12/19 0929 01/21/19 1215  Weight: 67.6 kg 71.9 kg    Examination:  General exam:still no changes, appears calm and comfortable  Respiratory system: Clear to auscultation. Respiratory effort normal. Cardiovascular system:S1 &S2 heard, RRR. No JVD, murmurs, rubs, gallops or clicks. No pedal edema. Gastrointestinal system:Quiet bowel sounds, soft mildly tender to palpation diffusely without focal findings, no rebound no guarding not rigid Central  nervous system:Alert and oriented. No focal neurological deficits. Extremities:Moves all 4 extremities freely no focal neurological deficits Skin: No rashes, lesions or ulcers Psychiatry:Judgement and insight appear normal. Mood &affect appropriate     Data Reviewed: I have personally reviewed following labs and imaging studies  CBC: Recent Labs  Lab 01/27/19 0349 01/29/19 0410  WBC 6.6 6.3  NEUTROABS 4.6 4.3  HGB 9.4* 9.3*  HCT 31.9* 31.8*  MCV 84.4 85.7  PLT 336 318   Basic Metabolic Panel: Recent Labs  Lab 01/27/19 0349  01/29/19 0410 01/30/19 0345 01/31/19 0344 02/01/19 0400 02/02/19 0357  NA 135   < > 137 135 134* 132* 134*  K 4.5   < > 4.6 4.6 4.6 4.2 4.3  CL 103   < > 103 101 103 100 101  CO2 23   < > GLUCOSE 113*   < > 129* 162* 134* 142* 143*  BUN 25*   < > 31* 30* 27* 26* 27*  CREATININE 0.53   < > 0.66 0.60 0.57 0.54 0.58  CALCIUM 8.1*   < > 8.4* 8.3* 8.1* 8.2* 8.2*  MG 2.2  --   --  2.1  --   --   --   PHOS 3.4  --   --  3.3  --   --   --    < > = values in this interval not displayed.   GFR: Estimated Creatinine Clearance: 45.5 mL/min (by C-G formula based on SCr of 0.58 mg/dL). Liver Function Tests: Recent Labs  Lab 01/27/19 0349 01/30/19 0345  AST 22 24  ALT 26 29  ALKPHOS 53 61  BILITOT 0.3 <0.1*  PROT 5.6* 5.5*  ALBUMIN 2.6* 2.6*   No results for input(s): LIPASE, AMYLASE in the last 168 hours. No results for input(s): AMMONIA in the last 168 hours. Coagulation Profile: No results for input(s): INR, PROTIME in the last 168 hours. Cardiac Enzymes: No results for input(s): CKTOTAL, CKMB, CKMBINDEX, TROPONINI in the last 168 hours. BNP (last 3 results) No results for input(s): PROBNP in the last 8760 hours. HbA1C: No results for input(s): HGBA1C in the last 72 hours. CBG: Recent Labs  Lab 01/31/19 2212 02/01/19 0645 02/01/19 1539 02/01/19 2119 02/02/19 0552  GLUCAP 133* 138* 148* 127* 137*   Lipid  Profile: No results for input(s): CHOL, HDL, LDLCALC, TRIG, CHOLHDL, LDLDIRECT in the last 72 hours. Thyroid Function Tests: No results for input(s): TSH, T4TOTAL, FREET4, T3FREE, THYROIDAB in the last 72 hours. Anemia Panel: No results for input(s): VITAMINB12, FOLATE, FERRITIN, TIBC, IRON, RETICCTPCT in the last 72 hours. Sepsis Labs: No results for input(s): PROCALCITON, LATICACIDVEN in the last 168 hours.  No results found for this or any previous visit (from the past 240 hour(s)).       Radiology Studies: No results found.      Scheduled Meds:  calcium carbonate  1 tablet Oral TID WC   docusate sodium  100 mg Oral BID   feeding supplement  1 Container Oral QID   heparin  5,000 Units Subcutaneous Q8H   insulin aspart  0-15 Units Subcutaneous Q8H   latanoprost  1 drop  Both Eyes QHS   levothyroxine  50 mcg Oral Q0600   loratadine  10 mg Oral Daily   pantoprazole  40 mg Oral BID   polyethylene glycol  17 g Oral Daily   sodium chloride flush  10-40 mL Intracatheter Q12H   Continuous Infusions:  lactated ringers 10 mL/hr at 02/02/19 0600   TPN ADULT (ION) 75 mL/hr at 02/02/19 0600   TPN ADULT (ION)       LOS: 21 days    Time spent: 35 min    Burke Keels, MD [facility]  If 7PM-7AM, please contact night-coverage  02/02/2019, 12:44 PM

## 2019-02-03 ENCOUNTER — Inpatient Hospital Stay (HOSPITAL_COMMUNITY): Payer: Medicare Other

## 2019-02-03 LAB — COMPREHENSIVE METABOLIC PANEL
ALT: 23 U/L (ref 0–44)
AST: 19 U/L (ref 15–41)
Albumin: 2.6 g/dL — ABNORMAL LOW (ref 3.5–5.0)
Alkaline Phosphatase: 64 U/L (ref 38–126)
Anion gap: 9 (ref 5–15)
BUN: 28 mg/dL — ABNORMAL HIGH (ref 8–23)
CO2: 24 mmol/L (ref 22–32)
Calcium: 8.1 mg/dL — ABNORMAL LOW (ref 8.9–10.3)
Chloride: 102 mmol/L (ref 98–111)
Creatinine, Ser: 0.59 mg/dL (ref 0.44–1.00)
GFR calc Af Amer: >60 mL/min (ref >60)
GFR calc non Af Amer: >60 mL/min (ref >60)
Glucose, Bld: 133 mg/dL — ABNORMAL HIGH (ref 70–99)
Potassium: 4.4 mmol/L (ref 3.5–5.1)
Sodium: 135 mmol/L (ref 135–145)
Total Bilirubin: 0.1 mg/dL — ABNORMAL LOW (ref 0.3–1.2)
Total Protein: 5.4 g/dL — ABNORMAL LOW (ref 6.5–8.1)

## 2019-02-03 LAB — DIFFERENTIAL
Abs Immature Granulocytes: 0.06 10*3/uL (ref 0.00–0.07)
Basophils Absolute: 0 10*3/uL (ref 0.0–0.1)
Basophils Relative: 0 %
Eosinophils Absolute: 0.1 10*3/uL (ref 0.0–0.5)
Eosinophils Relative: 2 %
Immature Granulocytes: 1 %
Lymphocytes Relative: 36 %
Lymphs Abs: 1.9 10*3/uL (ref 0.7–4.0)
Monocytes Absolute: 0.6 10*3/uL (ref 0.1–1.0)
Monocytes Relative: 11 %
Neutro Abs: 2.5 10*3/uL (ref 1.7–7.7)
Neutrophils Relative %: 50 %

## 2019-02-03 LAB — CBC
HCT: 29.9 % — ABNORMAL LOW (ref 36.0–46.0)
Hemoglobin: 8.9 g/dL — ABNORMAL LOW (ref 12.0–15.0)
MCH: 25.1 pg — ABNORMAL LOW (ref 26.0–34.0)
MCHC: 29.8 g/dL — ABNORMAL LOW (ref 30.0–36.0)
MCV: 84.2 fL (ref 80.0–100.0)
Platelets: 242 10*3/uL (ref 150–400)
RBC: 3.55 MIL/uL — ABNORMAL LOW (ref 3.87–5.11)
RDW: 16.4 % — ABNORMAL HIGH (ref 11.5–15.5)
WBC: 5.2 10*3/uL (ref 4.0–10.5)
nRBC: 0 % (ref 0.0–0.2)

## 2019-02-03 LAB — MAGNESIUM: Magnesium: 2.2 mg/dL (ref 1.7–2.4)

## 2019-02-03 LAB — PHOSPHORUS: Phosphorus: 3.5 mg/dL (ref 2.5–4.6)

## 2019-02-03 LAB — GLUCOSE, CAPILLARY
Glucose-Capillary: 152 mg/dL — ABNORMAL HIGH (ref 70–99)
Glucose-Capillary: 139 mg/dL — ABNORMAL HIGH (ref 70–99)

## 2019-02-03 LAB — TRIGLYCERIDES: Triglycerides: 93 mg/dL (ref <150)

## 2019-02-03 LAB — PREALBUMIN: Prealbumin: 16.5 mg/dL — ABNORMAL LOW (ref 18–38)

## 2019-02-03 MED ORDER — METOCLOPRAMIDE HCL 5 MG PO TABS
5.0000 mg | ORAL_TABLET | Freq: Three times a day (TID) | ORAL | Status: DC
  Administered 2019-02-03 – 2019-02-11 (×21): 5 mg via ORAL
  Filled 2019-02-03 (×21): qty 1

## 2019-02-03 MED ORDER — TRAVASOL 10 % IV SOLN
INTRAVENOUS | Status: AC
  Administered 2019-02-03: 18:00:00 via INTRAVENOUS
  Filled 2019-02-03: qty 990

## 2019-02-03 MED ORDER — ENSURE ENLIVE PO LIQD
237.0000 mL | Freq: Two times a day (BID) | ORAL | Status: DC
  Administered 2019-02-03 – 2019-02-10 (×10): 237 mL via ORAL

## 2019-02-03 MED ORDER — BOOST / RESOURCE BREEZE PO LIQD CUSTOM
1.0000 | Freq: Two times a day (BID) | ORAL | Status: DC
  Administered 2019-02-04 – 2019-02-11 (×10): 1 via ORAL

## 2019-02-03 MED ORDER — ADULT MULTIVITAMIN W/MINERALS CH
1.0000 | ORAL_TABLET | Freq: Every day | ORAL | Status: DC
  Administered 2019-02-04 – 2019-02-11 (×7): 1 via ORAL
  Filled 2019-02-03 (×7): qty 1

## 2019-02-03 NOTE — Progress Notes (Signed)
[facility] Progress Note  20 Days Post-Op  Subjective: CC: no appetite Patient denies abdominal pain. Reports just not having much appetite and feeling like stuff is sitting in chest. Mild nausea at times. Walking with staff.   Objective: Vital signs in last 24 hours: Temp:  [98.1 F (36.7 C)-98.4 F (36.9 C)] 98.1 F (36.7 C) (06/29 0535) Pulse Rate:  [80-84] 80 (06/29 0535) Resp:  [14-18] 15 (06/29 0535) BP: (97-120)/(59-70) 97/59 (06/29 0535) SpO2:  [96 %-97 %] 96 % (06/29 0535) Weight:  [68.8 kg] 68.8 kg (06/29 0535) Last BM Date: 02/02/19  Intake/Output from previous day: 06/28 0701 - 06/29 0700 In: 1414.5 [P.O.:400; I.V.:1014.5] Out: 800 [Urine:800] Intake/Output this shift: Total I/O In: 240 [P.O.:240] Out: 2 [Urine:1; Stool:1]  PE: Gen:  Alert, NAD, pleasant Card:  Regular rate and rhythm Pulm:  Normal effort, clear to auscultation bilaterally Abd: Soft, non-tender, mildly distended, +BS, incision c/d/i with steri-strips present  Skin: warm and dry, no rashes  Psych: A&Ox3   Lab Results:  Recent Labs    02/03/19 0457  WBC 5.2  HGB 8.9*  HCT 29.9*  PLT 242   BMET Recent Labs    02/02/19 0357 02/03/19 0457  NA 134* 135  K 4.3 4.4  CL 101 102  CO2 25 24  GLUCOSE 143* 133*  BUN 27* 28*  CREATININE 0.58 0.59  CALCIUM 8.2* 8.1*   PT/INR No results for input(s): LABPROT, INR in the last 72 hours. CMP     Component Value Date/Time   NA 135 02/03/2019 0457   K 4.4 02/03/2019 0457   CL 102 02/03/2019 0457   CO2 24 02/03/2019 0457   GLUCOSE 133 (H) 02/03/2019 0457   BUN 28 (H) 02/03/2019 0457   CREATININE 0.59 02/03/2019 0457   CALCIUM 8.1 (L) 02/03/2019 0457   PROT 5.4 (L) 02/03/2019 0457   ALBUMIN 2.6 (L) 02/03/2019 0457   AST 19 02/03/2019 0457   ALT 23 02/03/2019 0457   ALKPHOS 64 02/03/2019 0457   BILITOT 0.1 (L) 02/03/2019 0457   GFRNONAA >60 02/03/2019 0457   GFRAA >60 02/03/2019 0457   Lipase     Component Value  Date/Time   LIPASE 29 01/12/2019 0928       Studies/Results: Dg Abd Portable 1v  Result Date: 02/03/2019 CLINICAL DATA:  Postoperative ileus EXAM: PORTABLE ABDOMEN - 1 VIEW COMPARISON:  Portable exam 1021 hours compared to 01/28/2019 FINDINGS: Distended air-filled small bowel loops throughout abdomen. Scattered gas in colon. Findings are consistent with a postoperative ileus, little changed. No definite bowel wall thickening. Bones demineralized with degenerative changes of the spine and RIGHT hip. LEFT hip prosthesis noted. IMPRESSION: Persistent postoperative ileus. Electronically Signed   By: Lavonia Dana M.D.   On: 02/03/2019 10:37    Anti-infectives: Anti-infectives (From admission, onward)   Start     Dose/Rate Route Frequency Ordered Stop   01/14/19 1103  sodium chloride 0.9 % with cefoTEtan (CEFOTAN) ADS Med    Note to Pharmacy: [name]  : cabinet override      01/14/19 1103 01/14/19 1124   01/14/19 1045  cefoTEtan (CEFOTAN) 2 g in sodium chloride 0.9 % 100 mL IVPB     2 g 200 mL/hr over 30 Minutes Intravenous  Once 01/14/19 1044 01/14/19 1124       Assessment/Plan Thyroid disease HTN HLD DM Protein Calorie Malnutrition- Pre-albumin 16.4 today  SBO S/p Ex Lap with LOA for SBO due to Omental Adhesion,internal hernia - Dr.  Derrell Lollingamirez - 01/14/2019 - POD #20 - Mobilize for bowel function - Pulm toliet - Keep K>4 and Mg >2 for bowel function - Soft diet, continue boost - Continue TPN till more intake. Calorie count pending - check abdominal film today, may need to start on scheduled reglan     FEN - TPN, Soft, Bowel Regimen (Miralax, Colace) VTE - Heparin, SCDs, Mobilize  ID - Cefotetan periop. None currently. Afebrile. Foley - None  Follow up- Dr. Derrell Lollingamirez  POC - Son - Rockie Neighboursd Bly [phone]) -Patient's son and daughter plan to be with her when she returns home. Daughter during the day and son at night.  LOS: 22 days    Wells GuilesKelly Rayburn ,  [facility] 02/03/2019, 10:53 AM Pager: [phone] Consults: [phone]

## 2019-02-03 NOTE — Progress Notes (Signed)
Nutrition Follow-up  DOCUMENTATION CODES:   Not applicable  INTERVENTION:  - continue Boost Breeze but will decrease from QID to BID, each supplement provides 250 kcal and 9 grams of protein. - will order Ensure Enlive BID, each supplement provides 350 kcal and 20 grams of protein. - recommend decrease TPN to 40 ml/hr--may aid in stimulating appetite and increase PO intakes.    NUTRITION DIAGNOSIS:   Inadequate oral intake related to acute illness, decreased appetite as evidenced by meal completion < 50%, per patient/family report. -revised, ongoing  GOAL:   Patient will meet greater than or equal to 90% of their needs -met with current TPN regimen and minimal PO intakes.   MONITOR:   PO intake, Supplement acceptance, Labs, Weight trends, Other (Comment)(TPN regimen)  REASON FOR ASSESSMENT:   Consult Calorie Count  ASSESSMENT:   85 year old female with history of HTN, hyperlipidemia, and GERD who presented initially to [facility] with complaints of generalized abdominal pain and vomiting that started after dinner. Patient reported lower abdominal pain which progressively got worse and developed N/V after that. Last bowel movement was 2 days before admission. CT abdomen showed SBO with dilated loops of small bowel. Findings concerning for closed-loop obstruction. General surgery consulted.  Patient weighed this AM and current weight of 152 lb is down from weight on 6/16 of 159 lb. The only other weight in the chart is admission (6/7) weight of 149 lb. Will continue to monitor.   She has double lumen PICC in R brachial which was placed on 6/10 and is receiving TPN at goal rate of 75 ml/hr. This regimen is providing 1854 kcal and 99 grams of protein to meet 100% estimated kcal and protein need.   NGT was replaced on 6/19 and removed on 6/24. Diet was advanced from NPO to CLD on 6/24 at 2:40 PM, to Chauncey on 6/25 at 9:00 AM, and to Soft on 6/27 at 6:10 AM. Boost Breeze was  ordered QID starting yesterday AM and she accepted all 4 cartons offered. Calorie Count was ordered on 6/27 at the time diet was advanced to Soft.  Breakfast 6/27 (10%) Lunch 6/27 (10%) Dinner 6/27 (10%) Total intake 6/27: 230 kcal, 9 grams protein  Breakfast 6/28 (0%) Lunch 6/28 (25%) Dinner 6/28 (25%) Total intake 6/28: 316 kcal, 17 grams protein *unsure of amount of each carton of Boost Breeze that was consumed*    Medications reviewed; 100 mg colace BID, sliding scale novolog, 50 mcg oral synthroid/day, 5 mg oral reglan TID, 40 mg oral protonix BID, 1 packet miralax/day. Labs reviewed; CBG: 152 mg/dl today, BUN: 28 mg/dl, Ca: 8.1 mg/dl.  IVF; LR @ 10 ml/hr.    Diet Order:   Diet Order            DIET SOFT Room service appropriate? Yes; Fluid consistency: Thin  Diet effective now              EDUCATION NEEDS:   No education needs have been identified at this time  Skin:  Skin Assessment: Skin Integrity Issues: Skin Integrity Issues:: Incisions Incisions: abdomen (6/9)  Last BM:  6/28  Height:   Ht Readings from Last 1 Encounters:  01/12/19 5' 6.5" (1.689 m)    Weight:   Wt Readings from Last 1 Encounters:  02/03/19 68.8 kg    Ideal Body Weight:  60.2 kg  BMI:  Body mass index is 24.12 kg/m.  Estimated Nutritional Needs:   Kcal:  1750-2025 kcal  Protein:  80-95 grams  Fluid:  >/= 1.8 L/day     Jarome Matin, MS, RD, LDN, [facility] Inpatient Clinical Dietitian Pager # [phone] After hours/weekend pager # [phone]

## 2019-02-03 NOTE — Progress Notes (Signed)
PHARMACY - ADULT TOTAL PARENTERAL NUTRITION CONSULT NOTE   Pharmacy Consult for TPN Indication: Post-operative ileus following surgery for SBO  Patient Measurements: Height: 5' 6.5" (168.9 cm) Weight: 151 lb 11.2 oz (68.8 kg) IBW/kg (Calculated) : 60.45 TPN AdjBW (KG): 67.6 Body mass index is 24.12 kg/m.   Vitals:   02/02/19 2125 02/03/19 0535  BP: 113/70 (!) 97/59  Pulse: 84 80  Resp: 18 15  Temp: 98.1 F (36.7 C) 98.1 F (36.7 C)  SpO2: 97% 96%    Intake/Output Summary (Last 24 hours) at 02/03/2019 1144 Last data filed at 02/03/2019 0827 Gross per 24 hour  Intake 1314.53 ml  Output 802 ml  Net 512.53 ml    Recent Labs    02/02/19 0357 02/03/19 0457  NA 134* 135  K 4.3 4.4  CL 101 102  CO2 25 24  GLUCOSE 143* 133*  BUN 27* 28*  CREATININE 0.58 0.59  CALCIUM 8.2* 8.1*  PHOS  --  3.5  MG  --  2.2  ALBUMIN  --  2.6*  ALKPHOS  --  64  AST  --  19  ALT  --  23  BILITOT  --  0.1*  TRIG  --  93  PREALBUMIN  --  16.5*   Current Nutrition: Soft diet, TPN, took 2 of 3 Breeze drinks yesterday (500 kcal, 18 g protein total) IVF: LR @ 84ml/hr Central access: PICC  TPN start date: 6/10  ASSESSMENT                                                                                        HPI: 85 y/o F with SBO underwent laparotomy and lysis of adhesions on 01/14/19, now on TPN. Hx of DM (diet-controlled) noted.  Significant events:  6/14: continue TPN, start CL soon and per CCS note may start to wean TPN soon if tolerated 6/16: started clear liquids 6/17: TPN reduced to 1/2 rate per surgery 6/18: +ileus, attempting to insert NGT but pt refusing.   6/20: Increase TPN back to full dose per surgery 6/22: repeat clamping trial but later developed nausea 6/24: NG removed, starting CLD, bowel regimen 6/25: advance to FLD 6/27: BM, advance to soft diet, add Breeze TID; starting 48 hr calorie count 6/29 eating minimally; having stools; KUB persistent postop ileus   Today:    Glucose (goal CBG<150)  Hx DM - diet controlled  CBGs acceptable 137-157  Received 8 units sensitive SSI yesterday; 20 units in TPN  Electrolytes - stable WNL  Renal - SCr, bicarb, BUN elevated without obvious source  LFTs - stable WNL  TGs - stable WNL  Prealbumin -14.1 >> 12.3>16.5 (6/29)  NUTRITIONAL GOALS                                                                           KCal:    8657-8469 /  day Protein: 80-95 g /day  Keep Mg 2.0+, K 4.0+ with ileus  PLAN                                                                                                         At 6pm:  Continue custom TPN at goal rate of 75 ml/hr;  RD rec to cut TPN to 40 ml/hr to aid in appetite stimulation  TPN at 75 ml/hr provides 1854 kcal, 99 g protein daily = full support  Continue 20 units insulin in TPN  Electrolytes: no changes from yesterday; Cl:Ac = 1:1  PO MVI  Continue moderate SSI, w/ q8 hr CBG checks   IVF per MD  TPN lab panels on Mondays & Thursdays  Herby AbrahamMichelle T. Nena Hampe, Pharm.D 02/03/2019 11:52 AM

## 2019-02-03 NOTE — Progress Notes (Signed)
PROGRESS NOTE    Christie Hinton  ZOX:[id number] DOB: 1928-07-12 DOA: 01/12/2019 PCP: Sharren Bridge, NP   Brief Narrative:  Per admitting MD: 85 year old female who presented abdominal pain, nausea or vomiting. She does have significant past medical history for hypertension, dyslipidemia and GERD. She reported generalized abdominal pain after a meal, associated with vomiting. She presented to the hospital due to progressive abdominal pain. On her initial physical examination her temperature was 97.7, heart rate 76, blood pressure 166/82, oxygen saturation 99%. Her lungs are clear to auscultation bilaterally, heart S1-S2 present rhythmic, the abdomen was soft, generalized abdominal pain to palpation, hypoactive bowel sounds, no lower extremity edema. CT of the abdomen with a small bowel obstruction, dilated loops of small bowel in the left abdomen with mesenteric edema and a well-defined transition point.Findings concerning for closed-loop obstruction, possibly related to an internal hernia.  Patient was admitted to the hospital working diagnosis of small bowel obstruction.  Underwent exploratory laparotomy on 01/14/19, lysis of adhesions. Complicated with post operative ileus.   Very slow to recover ileuos, required NG tube to low intermittent suction.   Assessment & Plan:   Principal Problem:   SBO (small bowel obstruction) s/p lysis of adhesions 01/14/2019 Active Problems:   Essential hypertension   Mixed hyperlipidemia   1. Small bowel obstructionsp exploratory laparotomy 01/14/19, with post operative ileus.improvingnausea-reported none overnight,poor po intake-calorie count PENDING, still having trouble increasing PO intake, Surgery following, agree with plan, continue analgesics, soft diet, c/w tpnuntil po intake picks up  2. Hypokalemia, hypophosphatemia.Corrected electrolytes with K at 4,4and preserved renal function, cr at 0,59  3. HTN.Blood pressure is  controlled, systolic151mmHg, will continue to hold on antihypertensive agents.  4. Hypothyroid.On IVlevothyroxine an additional day, likely tomorrow will change to po when patient tolerating po route.No evidence of obvious hormonal dysregulation  5. Dyslipidemia.Statin therapy.  6. T2DM.Continue with sliding scale insulin check and blood glucose AC at bedtime,   DVT prophylaxis: Lovenox SQ  Code Status: Full    Code Status Orders  (From admission, onward)         Start     Ordered   01/12/19 1639  Full code  Continuous     01/12/19 1639        Code Status History    This patient has a current code status but no historical code status.   Advance Care Planning Activity     Family Communication: none today  Disposition Plan:   Patient remained inpatient postop day #18status post ex lap with lysis of adhesions for small bowel obstruction. Patient very slow to recover and mobilized bowel function. Advancingdietto see if po intake sufficent to d/c tpn,Patientunable tobe discharged home until she is able to tolerate diet. Without these treatments patient did have a clinical deterioration that could be life-threatening.  Consults called: None Admission status: Inpatient   Consultants:   gen surg  Procedures:  Dg Abd 1 View  Result Date: 01/16/2019 CLINICAL DATA:  Abdominal distension EXAM: ABDOMEN - 1 VIEW COMPARISON:  01/15/2019 FINDINGS: Persistent small bowel dilatation is noted consistent with at least partial small bowel obstruction. Contrast is noted within the right colon administered on prior CT examination. Postsurgical changes are seen. IMPRESSION: Stable small bowel dilatation. This may be related to postoperative ileus although persistent small-bowel obstruction deserves consideration as well. Correlation with the physical exam is recommended. Electronically Signed   By: Alcide Clever M.D.   On: 01/16/2019 10:21   Dg Abd 1 View  Result  Date: 01/15/2019 CLINICAL DATA:  Small-bowel obstruction EXAM: ABDOMEN - 1 VIEW COMPARISON:  01/14/2019 FINDINGS: Persistent markedly dilated small intestine consistent with ongoing small bowel obstruction. Collapsed colon containing some contrast. IMPRESSION: Persistent small bowel obstruction pattern. No visible nasogastric tube. Electronically Signed   By: Paulina FusiMark  Shogry M.D.   On: 01/15/2019 11:20   X-ray Abdomen Ap  Result Date: 01/14/2019 CLINICAL DATA:  Nasogastric tube placement. EXAM: ABDOMEN - 1 VIEW COMPARISON:  Radiograph of same day. FINDINGS: Slightly decreased small bowel dilatation is noted. These findings remain suspicious for distal small bowel obstruction. Midline surgical staples are noted. Nasogastric tube is seen looped within probable hiatal hernia above the diaphragm. IMPRESSION: Nasogastric tube tip appears to be looped within the hiatal hernia above the diaphragm. Slightly decreased small bowel dilatation is noted as described above. Electronically Signed   By: Lupita RaiderJames  Green Jr M.D.   On: 01/14/2019 14:40   X-ray Abdomen Ap  Result Date: 01/14/2019 CLINICAL DATA:  Nasogastric tube placement. EXAM: ABDOMEN - 1 VIEW COMPARISON:  Radiograph of January 14, 2019. FINDINGS: Stable severe small bowel dilatation is noted consistent with distal small bowel obstruction. Midline surgical staples are noted. No abnormal calcifications are noted. Nasogastric tube appears to be going into the right mainstem and lower lobe bronchus. IMPRESSION: Nasogastric tube is seen going into right mainstem and lower lobe bronchus. Immediate withdrawal is recommended. Critical Value/emergent results were called by telephone at the time of interpretation on 01/14/2019 at 1:42 pm toAshura, who verbally acknowledged these results and stated that they have already removed the nasogastric tube and replaced it. Stable small bowel dilatation is noted consistent with distal small bowel obstruction. Electronically Signed   By:  Lupita RaiderJames  Green Jr M.D.   On: 01/14/2019 13:43   Ct Abdomen Pelvis W Contrast  Result Date: 01/12/2019 CLINICAL DATA:  85 year old with bowel obstruction. Acute lower abdominal pain with nausea. EXAM: CT ABDOMEN AND PELVIS WITH CONTRAST TECHNIQUE: Multidetector CT imaging of the abdomen and pelvis was performed using the standard protocol following bolus administration of intravenous contrast. CONTRAST:  100mL OMNIPAQUE IOHEXOL 300 MG/ML  SOLN COMPARISON:  None. FINDINGS: Lower chest: Lung bases are clear.  No pleural effusions. Hepatobiliary: Normal appearance of the liver. Small hypodensity in the right hepatic lobe on image 19 is probably an incidental finding. Portal venous system is patent. Normal appearance of the gallbladder without inflammatory changes. No biliary dilatation. Pancreas: Unremarkable. No pancreatic ductal dilatation or surrounding inflammatory changes. Spleen: Normal in size without focal abnormality. Adrenals/Urinary Tract: Normal adrenal glands. Urinary bladder is unremarkable but slightly limited evaluation due to artifact from left hip replacement. Normal appearance of both kidneys without hydronephrosis. Stomach/Bowel: Extensive diverticulosis in the sigmoid colon and left colon without acute colonic inflammation. Normal appendix. Moderate sized hiatal hernia. Normal appearance of the duodenum. Dilated small bowel loops in the left abdomen compatible with jejunum. There is mesenteric edema associated with these dilated small bowel loops. Small bowel loops measure up to 3.3 cm. Distal small bowel is decompressed. Small bowel transition point in the left abdomen near the mesenteric edema. Transition point best seen on the coronal reformats, sequence 5 image 42 and sagittal reformats, sequence 6, image 84. Irregularity of the mesentery and fat near the bowel obstruction and raises concern for a closed loop obstruction and possibly an internal hernia. No evidence for pneumatosis or portal  air. Vascular/Lymphatic: Atherosclerotic disease in the abdominal aorta without aneurysm. No abdominopelvic lymphadenopathy. Reproductive: Uterus and bilateral adnexa are unremarkable. Other:  Small amount of free fluid in the pelvis. Minimal fluid in the left paracolic gutter. Mesenteric edema in the left abdomen associated with the dilated small bowel loops. Negative for free air. Musculoskeletal: No acute bone abnormality in the spine. Extensive facet arthropathy. IMPRESSION: 1. Small bowel obstruction. Dilated loops of small bowel in the left abdomen with mesenteric edema and well-defined transition point. Findings raise concern for a closed loop obstruction, possibly related to an internal hernia. Small amount of fluid in the abdomen and pelvis. 2. Colonic diverticulosis without acute colonic inflammation. 3. Moderate sized hiatal hernia. These results were called by telephone at the time of interpretation on 01/12/2019 at 12:08 pm to Dr. Alvira Monday , who verbally acknowledged these results. Electronically Signed   By: Richarda Overlie M.D.   On: 01/12/2019 12:16   Dg Abd 2 Views  Result Date: 01/19/2019 CLINICAL DATA:  Recent exploratory laparotomy with lysis of adhesions for small-bowel obstruction. Abdominal distension. EXAM: ABDOMEN - 2 VIEW COMPARISON:  01/16/2019 FINDINGS: The enteric tube has been removed. Postsurgical changes are again noted with skin staples remaining in place. Moderate to severe diffuse small bowel dilatation is similar to the prior study. Gas is present in nondilated colon. Residual oral contrast material has migrated distally and is now located in the descending and sigmoid colon. No intraperitoneal free air is identified. Prior left hip arthroplasty is noted. IMPRESSION: Unchanged small bowel dilatation concerning for obstruction. Electronically Signed   By: Sebastian Ache M.D.   On: 01/19/2019 14:25   Dg Abd Portable 1v  Result Date: 02/03/2019 CLINICAL DATA:  Postoperative  ileus EXAM: PORTABLE ABDOMEN - 1 VIEW COMPARISON:  Portable exam 1021 hours compared to 01/28/2019 FINDINGS: Distended air-filled small bowel loops throughout abdomen. Scattered gas in colon. Findings are consistent with a postoperative ileus, little changed. No definite bowel wall thickening. Bones demineralized with degenerative changes of the spine and RIGHT hip. LEFT hip prosthesis noted. IMPRESSION: Persistent postoperative ileus. Electronically Signed   By: Ulyses Southward M.D.   On: 02/03/2019 10:37   Dg Abd Portable 1v  Result Date: 01/28/2019 CLINICAL DATA:  Ileus follow-up. EXAM: PORTABLE ABDOMEN - 1 VIEW COMPARISON:  Abdominal x-ray dated January 25, 2019. FINDINGS: Unchanged enteric tube terminating in the distal esophagus. Persistent diffuse small bowel dilatation up to 5.7 cm, minimally improved. No radio-opaque calculi or other significant radiographic abnormality are seen. IMPRESSION: 1. Persistent ileus, minimally improved. 2. Unchanged enteric tube with the tip in the distal esophagus. Recommend advancement. Electronically Signed   By: Obie Dredge M.D.   On: 01/28/2019 15:35   Dg Abd Portable 1v  Result Date: 01/25/2019 CLINICAL DATA:  Ileus EXAM: PORTABLE ABDOMEN - 1 VIEW COMPARISON:  01/24/2019 abdominal radiograph FINDINGS: Enteric tube terminates at the esophagogastric junction. Vertical skin staples overlie the medial left abdomen. Partially visualized left total hip arthroplasty. Prominent diffuse small bowel dilatation up to the 6.1 cm diameter, minimally improved. No evidence of pneumatosis or pneumoperitoneum. Minimal colonic stool. No radiopaque nephrolithiasis. IMPRESSION: 1. Enteric tube terminates at the esophagogastric junction. 2. Prominent diffuse small bowel dilatation, minimally improved, compatible with severe postoperative adynamic ileus. Electronically Signed   By: Delbert Phenix M.D.   On: 01/25/2019 07:09   Dg Abd Portable 1v  Result Date: 01/24/2019 CLINICAL DATA:   Vomiting.  Abdominal pain. EXAM: PORTABLE ABDOMEN - 1 VIEW COMPARISON:  01/23/2019.  01/19/2019. FINDINGS: Surgical staples noted over the abdomen. Persistent prominently dilated small bowel loops are again noted. Small-bowel distention may have  increased from prior exam. Paucity of intra colonic air. No free air. Degenerative changes lumbar spine right hip. Total left hip replacement. IMPRESSION: Persistent prominent dilated small bowel loops are again noted. Small-bowel distention may have progressed from prior exam. Findings consistent small bowel obstruction. Electronically Signed   By: Maisie Fushomas  Register   On: 01/24/2019 07:16   Dg Abd Portable 1v  Result Date: 01/23/2019 CLINICAL DATA:  Vomiting, ileus postop EXAM: PORTABLE ABDOMEN - 1 VIEW COMPARISON:  01/19/2019 FINDINGS: Marked gaseous distention of predominantly small bowel loops. Gas noted within nondistended stomach and large bowel. Appearance is concerning for small bowel obstruction. No visible free air organomegaly. IMPRESSION: Continued marked dilatation of small bowel loops concerning for persistent small bowel obstruction. Electronically Signed   By: Charlett NoseKevin  Dover M.D.   On: 01/23/2019 19:50   Dg Abd Portable 1v  Result Date: 01/23/2019 CLINICAL DATA:  NG placement EXAM: PORTABLE ABDOMEN - 1 VIEW COMPARISON:  01/23/2019 FINDINGS: NG tip within hiatal hernia.  NG above the diaphragm. Dilated small bowel loops with mild improvement since earlier today. Skin staples in the midline of the abdomen. IMPRESSION: NG tip coiled in hiatal hernia.  Decrease small bowel dilatation. Electronically Signed   By: Marlan Palauharles  Clark M.D.   On: 01/23/2019 18:36   Dg Abd Portable 1v  Result Date: 01/14/2019 CLINICAL DATA:  Small-bowel obstruction EXAM: PORTABLE ABDOMEN - 1 VIEW COMPARISON:  01/13/2019 FINDINGS: Gastric catheter is noted within the stomach. Persistent and increased small-bowel dilatation is noted when compare with the previous day. Some colonic  gas is noted and stable. No free air is seen. IMPRESSION: Increasing small bowel dilatation when compared with the previous day Electronically Signed   By: Alcide CleverMark  Lukens M.D.   On: 01/14/2019 08:46   Dg Abd Portable 1v-small Bowel Obstruction Protocol-initial, 8 Hr Delay  Result Date: 01/13/2019 CLINICAL DATA:  Small-bowel obstruction. EXAM: PORTABLE ABDOMEN - 1 VIEW COMPARISON:  01/13/2019 FINDINGS: The enteric tube projects over the gastric body. The patient's oral contrast is not well appreciated on this exam and is favored to remain within the small bowel. There are multiple dilated loops of small bowel measuring up to approximately 4.5 cm. There is no definite pneumatosis or free air. There is a moderate amount of stool in the ascending colon. The bladder appears to be partially distended with contrast. IMPRESSION: 1. Oral contrast is not well appreciated on this exam but is favored to reside within the small bowel. No definite oral contrast visualized in the colon. 2. Persistently dilated loops of small bowel measuring up to approximately 4.5 cm in diameter. 3. Enteric tube projects over the left upper quadrant. Electronically Signed   By: Katherine Mantlehristopher  Green M.D.   On: 01/13/2019 20:00   Dg Abd Portable 1v-small Bowel Obstruction Protocol-initial, 8 Hr Delay  Result Date: 01/13/2019 CLINICAL DATA:  Small bowel obstruction. EXAM: PORTABLE ABDOMEN - 1 VIEW COMPARISON:  One-view abdomen 01/12/2019 FINDINGS: NG tube remains in place. Dilated loops of small bowel are slightly less prominent than on the prior study. Gas is present throughout the colon. Contrast is noted within the urinary bladder. Left hip replacement is evident. IMPRESSION: 1. Improving small bowel obstruction with NG tube in place. Electronically Signed   By: Marin Robertshristopher  Mattern M.D.   On: 01/13/2019 07:42   Dg Abd Portable 1v-small Bowel Protocol-position Verification  Result Date: 01/12/2019 CLINICAL DATA:  85 year old female with  history of nasogastric tube placement. Evaluate for bowel obstruction. EXAM: PORTABLE ABDOMEN - 1 VIEW  COMPARISON:  CT the abdomen and pelvis 01/12/2019. FINDINGS: Multiple prominent mildly dilated loops of gas-filled small bowel in the central abdomen measuring up to 3.4 cm in diameter. A small amount of gas and stool is noted in the colon. No pneumoperitoneum on this single supine view. Nasogastric tube extends into the proximal body of the stomach. Either native contrast filling the urinary bladder, related to recent contrast enhanced CT examination. Status post left hip arthroplasty. IMPRESSION: 1. Tip of nasogastric tube is in the proximal body of the stomach. 2. Bowel gas pattern again suggests early or partial small bowel obstruction. Electronically Signed   By: Vinnie Langton M.D.   On: 01/12/2019 19:09   Dg Addison Bailey G Tube Plc W/fl W/rad  Result Date: 01/24/2019 CLINICAL DATA:  Small-bowel distention.  NG tube for decompression. EXAM: NASO G TUBE PLACEMENT WITH FL AND WITH RAD CONTRAST:  None. FLUOROSCOPY TIME:  Fluoroscopy Time:  0 minutes and 48 seconds. Radiation Exposure Index (if provided by the fluoroscopic device): 16.7 mGy Number of Acquired Spot Images: 0 COMPARISON:  Abdomen film from 01/24/2020. Prior NG tube placement 01/15/2019. FINDINGS: NG tube was placed via the patient's right nostril and easily directed into the patient's known moderate to large hiatal hernia. Multiple attempts were made to direct the NG tube through the hernia into the subdiaphragmatic portion of the stomach, but these were unsuccessful. IMPRESSION: Successful NG tube placement into the supradiaphragmatic portion of the stomach in this patient with moderate to large hiatal hernia. As before, the tube could not be directed into the infra diaphragmatic portion of the stomach due to orientation of the hiatal hernia. I discussed this study with the patient's nurse Beth, at 1451 hours on 01/24/2019. Electronically Signed    By: Misty Stanley M.D.   On: 01/24/2019 14:46   Dg Intro Long Gi Tube  Result Date: 01/15/2019 CLINICAL DATA:  Small bowel obstruction.  Recent lysis of adhesions. EXAM: INTRO LONG GI TUBE CONTRAST:  68mL OMNIPAQUE IOHEXOL 300 MG/ML  SOLN FLUOROSCOPY TIME:  Fluoroscopy Time:  12 Radiation Exposure Index (if provided by the fluoroscopic device): 203.4 mGy Number of Acquired Spot Images: 1 COMPARISON:  CT 01/12/2019 FINDINGS: The patient arrived with new G-tube in the distal esophagus. Patient has a large hiatal hernia. Multiple attempts were made to pass the NG tube through the hiatal hernia. Contrast and air were administered into the hernia/stomach. A guidewire was employed. Patient was repositioned in multiple position. Ultimately, the tip of the NG tube could not be passed into the stomach due to the orientation of the orifice of the hiatal hernia. The gastric contents and administer contrast were aspirated easily back out of the hernia sac and stomach. IMPRESSION: NG tube position within the large hiatal hernia. Tube could not be passed into the proximal stomach due to the anatomy of the hernia. The tube will likely decompress the stomach in the current position. Findings conveyed topatient's floor nurse Tracyon 01/15/2019 at15:29. Electronically Signed   By: Suzy Bouchard M.D.   On: 01/15/2019 16:30   [place] Ekg Site Rite  Result Date: 01/14/2019 If Site Rite image not attached, placement could not be confirmed due to current cardiac rhythm.    Antimicrobials:   none    Subjective: Still with poor p.o. intake   Objective: Vitals:   02/02/19 1318 02/02/19 2125 02/03/19 0535 02/03/19 1322  BP: 120/66 113/70 (!) 97/59 104/67  Pulse: 83 84 80 82  Resp: 14 18 15 14   Temp: 98.4  F (36.9 C) 98.1 F (36.7 C) 98.1 F (36.7 C) 98.5 F (36.9 C)  TempSrc: Oral Oral Oral Oral  SpO2: 96% 97% 96% 97%  Weight:   68.8 kg   Height:        Intake/Output Summary (Last 24 hours) at 02/03/2019  1331 Last data filed at 02/03/2019 0827 Gross per 24 hour  Intake 1254.53 ml  Output 802 ml  Net 452.53 ml   Filed Weights   01/12/19 0929 01/21/19 1215 02/03/19 0535  Weight: 67.6 kg 71.9 kg 68.8 kg    Examination:  General exam:unchanged, appears calm and comfortable  Respiratory system: Clear to auscultation. Respiratory effort normal. Cardiovascular system:S1 &S2 heard, RRR. No JVD, murmurs, rubs, gallops or clicks. No pedal edema. Gastrointestinal system:Quiet bowel sounds, soft mildly tender to palpation diffusely without focal findings, no rebound no guarding not rigid Central nervous system:Alert and oriented. No focal neurological deficits. Extremities:Moves all 4 extremities freely no focal neurological deficits Skin: No rashes, lesions or ulcers Psychiatry:Judgement and insight appear normal. Mood &affect appropriate    Data Reviewed: I have personally reviewed following labs and imaging studies  CBC: Recent Labs  Lab 01/29/19 0410 02/03/19 0457  WBC 6.3 5.2  NEUTROABS 4.3 2.5  HGB 9.3* 8.9*  HCT 31.8* 29.9*  MCV 85.7 84.2  PLT 318 242   Basic Metabolic Panel: Recent Labs  Lab 01/30/19 0345 01/31/19 0344 02/01/19 0400 02/02/19 0357 02/03/19 0457  NA 135 134* 132* 134* 135  K 4.6 4.6 4.2 4.3 4.4  CL 101 103 100 101 102  CO2 GLUCOSE 162* 134* 142* 143* 133*  BUN 30* 27* 26* 27* 28*  CREATININE 0.60 0.57 0.54 0.58 0.59  CALCIUM 8.3* 8.1* 8.2* 8.2* 8.1*  MG 2.1  --   --   --  2.2  PHOS 3.3  --   --   --  3.5   GFR: Estimated Creatinine Clearance: 45.5 mL/min (by C-G formula based on SCr of 0.59 mg/dL). Liver Function Tests: Recent Labs  Lab 01/30/19 0345 02/03/19 0457  AST 24 19  ALT 29 23  ALKPHOS 61 64  BILITOT <0.1* 0.1*  PROT 5.5* 5.4*  ALBUMIN 2.6* 2.6*   No results for input(s): LIPASE, AMYLASE in the last 168 hours. No results for input(s): AMMONIA in the last 168 hours. Coagulation Profile: No results for  input(s): INR, PROTIME in the last 168 hours. Cardiac Enzymes: No results for input(s): CKTOTAL, CKMB, CKMBINDEX, TROPONINI in the last 168 hours. BNP (last 3 results) No results for input(s): PROBNP in the last 8760 hours. HbA1C: No results for input(s): HGBA1C in the last 72 hours. CBG: Recent Labs  Lab 02/01/19 2119 02/02/19 0552 02/02/19 1319 02/02/19 2122 02/03/19 0543  GLUCAP 127* 137* 157* 146* 152*   Lipid Profile: Recent Labs    02/03/19 0457  TRIG 93   Thyroid Function Tests: No results for input(s): TSH, T4TOTAL, FREET4, T3FREE, THYROIDAB in the last 72 hours. Anemia Panel: No results for input(s): VITAMINB12, FOLATE, FERRITIN, TIBC, IRON, RETICCTPCT in the last 72 hours. Sepsis Labs: No results for input(s): PROCALCITON, LATICACIDVEN in the last 168 hours.  No results found for this or any previous visit (from the past 240 hour(s)).       Radiology Studies: Dg Abd Portable 1v  Result Date: 02/03/2019 CLINICAL DATA:  Postoperative ileus EXAM: PORTABLE ABDOMEN - 1 VIEW COMPARISON:  Portable exam 1021 hours compared to 01/28/2019 FINDINGS: Distended air-filled small bowel loops  throughout abdomen. Scattered gas in colon. Findings are consistent with a postoperative ileus, little changed. No definite bowel wall thickening. Bones demineralized with degenerative changes of the spine and RIGHT hip. LEFT hip prosthesis noted. IMPRESSION: Persistent postoperative ileus. Electronically Signed   By: Ulyses SouthwardMark  Boles M.D.   On: 02/03/2019 10:37        Scheduled Meds:  calcium carbonate  1 tablet Oral TID WC   docusate sodium  100 mg Oral BID   feeding supplement  1 Container Oral BID BM   feeding supplement (ENSURE ENLIVE)  237 mL Oral BID BM   heparin  5,000 Units Subcutaneous Q8H   insulin aspart  0-15 Units Subcutaneous Q8H   latanoprost  1 drop Both Eyes QHS   levothyroxine  50 mcg Oral Q0600   loratadine  10 mg Oral Daily   metoCLOPramide  5 mg Oral  TID AC   multivitamin with minerals  1 tablet Oral Daily   pantoprazole  40 mg Oral BID   polyethylene glycol  17 g Oral Daily   sodium chloride flush  10-40 mL Intracatheter Q12H   Continuous Infusions:  lactated ringers 10 mL/hr at 02/02/19 1800   TPN ADULT (ION) 75 mL/hr at 02/02/19 1800   TPN ADULT (ION)       LOS: 22 days    Time spent: 35 min     Burke Keelshristopher Nataley Bahri, MD [facility]  If 7PM-7AM, please contact night-coverage  02/03/2019, 1:31 PM

## 2019-02-03 NOTE — Progress Notes (Signed)
Physical Therapy Treatment Patient Details Name: Christie Hinton MRN: [id number] DOB: 02-25-1928 Today's Date: 02/03/2019    History of Present Illness 85 yo female admitted with SBO. S/P lysis of adhesions 01/14/19. Hx of DM    PT Comments    Pt continues to participate well. She c/o nausea and general unwell feeling on today. Will continue to follow.    Follow Up Recommendations  Home health PT;Supervision - Intermittent     Equipment Recommendations  (4 wheeled walker)    Recommendations for Other Services       Precautions / Restrictions Precautions Precautions: Fall Restrictions Weight Bearing Restrictions: No    Mobility  Bed Mobility Overal bed mobility: Needs Assistance Bed Mobility: Supine to Sit     Supine to sit: Min assist;HOB elevated Sit to supine: Supervision   General bed mobility comments: assist to lift as pt reaching out for hand assist to sit up  Transfers Overall transfer level: Needs assistance Equipment used: 4-wheeled walker Transfers: Sit to/from Stand Sit to Stand: Supervision         General transfer comment: for safety. cues hand placement, proper operation of rollator  Ambulation/Gait Ambulation/Gait assistance: Min guard;Supervision Gait Distance (Feet): 500 Feet Assistive device: 4-wheeled walker       General Gait Details: able to demonstrate safe technique with rollator in bathroom and backing up to bed   Stairs             Wheelchair Mobility    Modified Rankin (Stroke Patients Only)       Balance             Standing balance-Leahy Scale: Fair Standing balance comment: need UE support for ambulation                            Cognition Arousal/Alertness: Awake/Hinton Behavior During Therapy: WFL for tasks assessed/performed Overall Cognitive Status: Within Functional Limits for tasks assessed                                        Exercises      General Comments         Pertinent Vitals/Pain Pain Assessment: Faces Faces Pain Scale: No hurt    Home Living                      Prior Function            PT Goals (current goals can now be found in the care plan section) Progress towards PT goals: Progressing toward goals    Frequency    Min 3X/week      PT Plan Current plan remains appropriate    Co-evaluation              AM-PAC PT "6 Clicks" Mobility   Outcome Measure  Help needed turning from your back to your side while in a flat bed without using bedrails?: A Little Help needed moving from lying on your back to sitting on the side of a flat bed without using bedrails?: A Little Help needed moving to and from a bed to a chair (including a wheelchair)?: A Little Help needed standing up from a chair using your arms (e.g., wheelchair or bedside chair)?: A Little Help needed to walk in hospital room?: A Little Help needed climbing 3-5 steps with a railing? :  A Little 6 Click Score: 18    End of Session   Activity Tolerance: Patient tolerated treatment well Patient left: in bed;with call bell/phone within reach   PT Visit Diagnosis: Unsteadiness on feet (R26.81);Muscle weakness (generalized) (M62.81)     Time: 1346-1400 PT Time Calculation (min) (ACUTE ONLY): 14 min  Charges:  $Gait Training: 8-22 mins                        Christie AlertJannie Karis Hinton, PT Acute Rehabilitation Services Pager: [phone] Office: [phone]

## 2019-02-03 NOTE — Care Management Important Message (Signed)
Important Message  Patient Details IM Letter given to Servando Snare SW to present to the Patient Name: Christie Hinton MRN: [id number] Date of Birth: 1927/11/11   Medicare Important Message Given:  Yes     Kerin Salen 02/03/2019, 11:27 AM

## 2019-02-04 LAB — GLUCOSE, CAPILLARY
Glucose-Capillary: 130 mg/dL — ABNORMAL HIGH (ref 70–99)
Glucose-Capillary: 131 mg/dL — ABNORMAL HIGH (ref 70–99)
Glucose-Capillary: 141 mg/dL — ABNORMAL HIGH (ref 70–99)

## 2019-02-04 MED ORDER — MEGESTROL ACETATE 20 MG PO TABS
20.0000 mg | ORAL_TABLET | Freq: Two times a day (BID) | ORAL | Status: DC
  Administered 2019-02-04 – 2019-02-07 (×6): 20 mg via ORAL
  Filled 2019-02-04 (×9): qty 1

## 2019-02-04 MED ORDER — ZOLPIDEM TARTRATE 5 MG PO TABS
5.0000 mg | ORAL_TABLET | Freq: Once | ORAL | Status: AC
  Administered 2019-02-04: 5 mg via ORAL
  Filled 2019-02-04: qty 1

## 2019-02-04 MED ORDER — TRAVASOL 10 % IV SOLN
INTRAVENOUS | Status: DC
  Filled 2019-02-04: qty 990

## 2019-02-04 MED ORDER — TRAVASOL 10 % IV SOLN: INTRAVENOUS | Status: DC

## 2019-02-04 MED ORDER — TRAVASOL 10 % IV SOLN
INTRAVENOUS | Status: DC
  Administered 2019-02-04: 19:00:00 via INTRAVENOUS

## 2019-02-04 MED ORDER — TRAVASOL 10 % IV SOLN: INTRAVENOUS | Status: AC

## 2019-02-04 NOTE — Progress Notes (Signed)
PHARMACY - ADULT TOTAL PARENTERAL NUTRITION CONSULT NOTE   Pharmacy Consult for TPN Indication: Post-operative ileus following surgery for SBO  Patient Measurements: Height: 5' 6.5" (168.9 cm) Weight: 151 lb 11.2 oz (68.8 kg) IBW/kg (Calculated) : 60.45 TPN AdjBW (KG): 67.6 Body mass index is 24.12 kg/m.   Vitals:   02/04/19 0107 02/04/19 0541  BP: 117/63 111/69  Pulse: 82 81  Resp: 17 17  Temp: 98.6 F (37 C) 98.6 F (37 C)  SpO2: 98% 97%    Intake/Output Summary (Last 24 hours) at 02/04/2019 0858 Last data filed at 02/04/2019 0854 Gross per 24 hour  Intake 1307.52 ml  Output 900 ml  Net 407.52 ml    Recent Labs    02/02/19 0357 02/03/19 0457  NA 134* 135  K 4.3 4.4  CL 101 102  CO2 25 24  GLUCOSE 143* 133*  BUN 27* 28*  CREATININE 0.58 0.59  CALCIUM 8.2* 8.1*  PHOS  --  3.5  MG  --  2.2  ALBUMIN  --  2.6*  ALKPHOS  --  64  AST  --  19  ALT  --  23  BILITOT  --  0.1*  TRIG  --  93  PREALBUMIN  --  16.5*   Current Nutrition: Soft diet, TPN @ 75 mL/hr, charted as taking 1 Resource Breeze supplement yesterday. IVF: LR @ 41ml/hr Central access: PICC  TPN start date: 6/10  ASSESSMENT                                                                                        HPI: 85 y/o F with SBO underwent laparotomy and lysis of adhesions on 01/14/19, now on TPN. Hx of DM (diet-controlled) noted.  Significant events:  6/14: continue TPN, start CL soon and per CCS note may start to wean TPN soon if tolerated 6/16: started clear liquids 6/17: TPN reduced to 1/2 rate per surgery 6/18: +ileus, attempting to insert NGT but pt refusing.   6/20: Increase TPN back to full dose per surgery 6/22: repeat clamping trial but later developed nausea 6/24: NG removed, starting CLD, bowel regimen 6/25: advance to FLD 6/27: BM, advance to soft diet, add Breeze TID; starting 48 hr calorie count 6/29: eating minimally; having stools; KUB persistent postop ileus   Today:    Glucose (goal CBG<150)  Hx DM - diet controlled  CBGs acceptable 130-152  Received 4 units sensitive SSI yesterday; 20 units in TPN  Electrolytes - on 6/29: stable WNL  Renal - SCr, bicarb, BUN elevated without obvious source  LFTs - on 6/29:  below ULN  TGs - on 6/29: WNL  Prealbumin - now improving 14.1>> 12.3> 16.5 (6/29)  NUTRITIONAL GOALS  KCal:    1610-9604:    1750-2025 / day Protein: 80-95 g /day  Keep Mg 2.0+, K 4.0+ with ileus  PLAN                                                                                                         At 6pm:  Continue custom TPN at goal rate of 75 ml/hr;  Note RD recommendations from 6/29 to cut TPN to 40 ml/hr to aid in appetite stimulation - will defer to MD regarding this  TPN at 75 ml/hr provides 1854 kcal, 99 g protein daily = full support  Continue 20 units insulin in TPN  Electrolytes: no changes from yesterday; Cl:Ac = 1:1  PO MVI w/minerals  Continue moderate SSI, w/ q8 hr CBG checks   IVF per MD  TPN lab panels on Mondays & Thursdays  Elie Goodyandy Tinlee Navarrette, PharmD, BCPS [phone] 02/04/2019  9:07 AM

## 2019-02-04 NOTE — Progress Notes (Signed)
[facility] Progress Note  21 Days Post-Op  Subjective: CC: decreased appetite Patient still reports little appetite. Occasional nauseated feeling. She is having bowel function.   Objective: Vital signs in last 24 hours: Temp:  [98.1 F (36.7 C)-98.6 F (37 C)] 98.1 F (36.7 C) (06/30 0930) Pulse Rate:  [81-84] 84 (06/30 0930) Resp:  [14-17] 16 (06/30 0930) BP: (103-123)/(61-69) 123/67 (06/30 0930) SpO2:  [97 %-98 %] 98 % (06/30 0930) Last BM Date: 02/03/19  Intake/Output from previous day: 06/29 0701 - 06/30 0700 In: 1487.5 [P.O.:610; I.V.:877.5] Out: 900 [Urine:900] Intake/Output this shift: Total I/O In: 300 [P.O.:300] Out: -   PE: Gen:  Alert, NAD, pleasant Card:  Regular rate and rhythm Pulm:  Normal effort, clear to auscultation bilaterally Abd: Soft, non-tender, mildly distended, +BS, incision c/d/i with steri-strips present  Skin: warm and dry, no rashes  Psych: A&Ox3   Lab Results:  Recent Labs    02/03/19 0457  WBC 5.2  HGB 8.9*  HCT 29.9*  PLT 242   BMET Recent Labs    02/02/19 0357 02/03/19 0457  NA 134* 135  K 4.3 4.4  CL 101 102  CO2 25 24  GLUCOSE 143* 133*  BUN 27* 28*  CREATININE 0.58 0.59  CALCIUM 8.2* 8.1*   PT/INR No results for input(s): LABPROT, INR in the last 72 hours. CMP     Component Value Date/Time   NA 135 02/03/2019 0457   K 4.4 02/03/2019 0457   CL 102 02/03/2019 0457   CO2 24 02/03/2019 0457   GLUCOSE 133 (H) 02/03/2019 0457   BUN 28 (H) 02/03/2019 0457   CREATININE 0.59 02/03/2019 0457   CALCIUM 8.1 (L) 02/03/2019 0457   PROT 5.4 (L) 02/03/2019 0457   ALBUMIN 2.6 (L) 02/03/2019 0457   AST 19 02/03/2019 0457   ALT 23 02/03/2019 0457   ALKPHOS 64 02/03/2019 0457   BILITOT 0.1 (L) 02/03/2019 0457   GFRNONAA >60 02/03/2019 0457   GFRAA >60 02/03/2019 0457   Lipase     Component Value Date/Time   LIPASE 29 01/12/2019 0928       Studies/Results: Dg Abd Portable 1v  Result Date:  02/03/2019 CLINICAL DATA:  Postoperative ileus EXAM: PORTABLE ABDOMEN - 1 VIEW COMPARISON:  Portable exam 1021 hours compared to 01/28/2019 FINDINGS: Distended air-filled small bowel loops throughout abdomen. Scattered gas in colon. Findings are consistent with a postoperative ileus, little changed. No definite bowel wall thickening. Bones demineralized with degenerative changes of the spine and RIGHT hip. LEFT hip prosthesis noted. IMPRESSION: Persistent postoperative ileus. Electronically Signed   By: Ulyses SouthwardMark  Boles M.D.   On: 02/03/2019 10:37    Anti-infectives: Anti-infectives (From admission, onward)   Start     Dose/Rate Route Frequency Ordered Stop   01/14/19 1103  sodium chloride 0.9 % with cefoTEtan (CEFOTAN) ADS Med    Note to Pharmacy: Vevelyn RoyalsHarvell, Gwendolyn  : cabinet override      01/14/19 1103 01/14/19 1124   01/14/19 1045  cefoTEtan (CEFOTAN) 2 g in sodium chloride 0.9 % 100 mL IVPB     2 g 200 mL/hr over 30 Minutes Intravenous  Once 01/14/19 1044 01/14/19 1124       Assessment/Plan Thyroid disease HTN HLD DM Protein Calorie Malnutrition- Pre-albumin 16.4 yesterday  SBO S/p Ex Lap with LOA for SBO due to Omental Adhesion,internal hernia - Dr. Derrell Lollingamirez - 01/14/2019 - POD #21 - Mobilize for bowel function - Pulm toliet - Keep K>4 and Mg >2 for  bowel function - Soft diet, continue boost - dietician following as well, will reduce TPN rate tonight to try to stimulate more PO intake  - film yesterday with ileus, continue scheduled low dose reglan   FEN - TPN, Soft, Bowel Regimen (Miralax, Colace) VTE - Heparin, SCDs, Mobilize  ID - Cefotetan periop. None currently. Afebrile. Foley - None  Follow up- Dr. Rosendo Gros  POC - Son - Tyrone Schimke [phone]) -Patient's son and daughter plan to be with her when she returns home. Daughter during the day and son at night.  LOS: 23 days    Brigid Re , [facility] 02/04/2019, 10:23 AM Pager:  [phone] Consults: [phone]

## 2019-02-04 NOTE — Progress Notes (Addendum)
PROGRESS NOTE    Christie Hinton  ZOX:[id number] DOB: 23-Jun-1928 DOA: 01/12/2019 PCP: Sharren Bridge, NP   Brief Narrative:  Per admitting MD: 85 year old female who presented abdominal pain, nausea or vomiting. She does have significant past medical history for hypertension, dyslipidemia and GERD. She reported generalized abdominal pain after a meal, associated with vomiting. She presented to the hospital due to progressive abdominal pain. On her initial physical examination her temperature was 97.7, heart rate 76, blood pressure 166/82, oxygen saturation 99%. Her lungs are clear to auscultation bilaterally, heart S1-S2 present rhythmic, the abdomen was soft, generalized abdominal pain to palpation, hypoactive bowel sounds, no lower extremity edema. CT of the abdomen with a small bowel obstruction, dilated loops of small bowel in the left abdomen with mesenteric edema and a well-defined transition point.Findings concerning for closed-loop obstruction, possibly related to an internal hernia.  Patient was admitted to the hospital working diagnosis of small bowel obstruction.  Underwent exploratory laparotomy on 01/14/19, lysis of adhesions. Complicated with post operative ileus.   Very slow to recover ileuos, required NG tube to low intermittent suction.   Assessment & Plan:   Principal Problem:   SBO (small bowel obstruction) s/p lysis of adhesions 01/14/2019 Active Problems:   Essential hypertension   Mixed hyperlipidemia   1. Small bowel obstructionsp exploratory laparotomy 01/14/19, with post operative ileus. POD 21! Intermittent nausea ?PHLEGM VS TRUE NAUSEA, still having trouble increasing PO intake,Surgery following, will decrease TPN with the hope to stimulate p.o. intake, currently patient not meeting caloric needs current p.o. intake  2. Hypokalemia, hypophosphatemia.Corrected electrolytes with K at 4,4and preserved renal function, cr at 0,59  3. HTN.Blood  pressure is controlled, systolic131mmHg, will continue to hold on antihypertensive agents.  4. Hypothyroid. Continue p.o. Synthroidno evidence of obvious hormonal dysregulation  5. Dyslipidemia.Statin therapy on discharge  6. T2DM.Continue with sliding scale insulin check and blood glucose AC at bedtime,  DVT prophylaxis: Heparin SQ  Code Status: Full    Code Status Orders  (From admission, onward)         Start     Ordered   01/12/19 1639  Full code  Continuous     01/12/19 1639        Code Status History    This patient has a current code status but no historical code status.   Advance Care Planning Activity     Family Communication: called son answered all questions Disposition Plan:   Patient remained inpatient postop day #21status post ex lap with lysis of adhesions for small bowel obstruction. Patient very slow to recover and mobilized bowel function. Advancingdietto see if po intake sufficent to d/c tpn,Patientunable tobe discharged home until she is able to tolerate diet. Without these treatments patient did have a clinical deterioration that could be life-threatening.  Consults called: None Admission status: Inpatient   Consultants:   gen surgery  Procedures:  Dg Abd 1 View  Result Date: 01/16/2019 CLINICAL DATA:  Abdominal distension EXAM: ABDOMEN - 1 VIEW COMPARISON:  01/15/2019 FINDINGS: Persistent small bowel dilatation is noted consistent with at least partial small bowel obstruction. Contrast is noted within the right colon administered on prior CT examination. Postsurgical changes are seen. IMPRESSION: Stable small bowel dilatation. This may be related to postoperative ileus although persistent small-bowel obstruction deserves consideration as well. Correlation with the physical exam is recommended. Electronically Signed   By: Alcide Clever M.D.   On: 01/16/2019 10:21   Dg Abd 1 View  Result Date: 01/15/2019  CLINICAL DATA:   Small-bowel obstruction EXAM: ABDOMEN - 1 VIEW COMPARISON:  01/14/2019 FINDINGS: Persistent markedly dilated small intestine consistent with ongoing small bowel obstruction. Collapsed colon containing some contrast. IMPRESSION: Persistent small bowel obstruction pattern. No visible nasogastric tube. Electronically Signed   By: Paulina Fusi M.D.   On: 01/15/2019 11:20   X-ray Abdomen Ap  Result Date: 01/14/2019 CLINICAL DATA:  Nasogastric tube placement. EXAM: ABDOMEN - 1 VIEW COMPARISON:  Radiograph of same day. FINDINGS: Slightly decreased small bowel dilatation is noted. These findings remain suspicious for distal small bowel obstruction. Midline surgical staples are noted. Nasogastric tube is seen looped within probable hiatal hernia above the diaphragm. IMPRESSION: Nasogastric tube tip appears to be looped within the hiatal hernia above the diaphragm. Slightly decreased small bowel dilatation is noted as described above. Electronically Signed   By: Lupita Raider M.D.   On: 01/14/2019 14:40   X-ray Abdomen Ap  Result Date: 01/14/2019 CLINICAL DATA:  Nasogastric tube placement. EXAM: ABDOMEN - 1 VIEW COMPARISON:  Radiograph of January 14, 2019. FINDINGS: Stable severe small bowel dilatation is noted consistent with distal small bowel obstruction. Midline surgical staples are noted. No abnormal calcifications are noted. Nasogastric tube appears to be going into the right mainstem and lower lobe bronchus. IMPRESSION: Nasogastric tube is seen going into right mainstem and lower lobe bronchus. Immediate withdrawal is recommended. Critical Value/emergent results were called by telephone at the time of interpretation on 01/14/2019 at 1:42 pm toAshura, who verbally acknowledged these results and stated that they have already removed the nasogastric tube and replaced it. Stable small bowel dilatation is noted consistent with distal small bowel obstruction. Electronically Signed   By: Lupita Raider M.D.   On:  01/14/2019 13:43   Ct Abdomen Pelvis W Contrast  Result Date: 01/12/2019 CLINICAL DATA:  85 year old with bowel obstruction. Acute lower abdominal pain with nausea. EXAM: CT ABDOMEN AND PELVIS WITH CONTRAST TECHNIQUE: Multidetector CT imaging of the abdomen and pelvis was performed using the standard protocol following bolus administration of intravenous contrast. CONTRAST:  OMNIPAQUE IOHEXOL 300 MG/ML  SOLN COMPARISON:  None. FINDINGS: Lower chest: Lung bases are clear.  No pleural effusions. Hepatobiliary: Normal appearance of the liver. Small hypodensity in the right hepatic lobe on image 19 is probably an incidental finding. Portal venous system is patent. Normal appearance of the gallbladder without inflammatory changes. No biliary dilatation. Pancreas: Unremarkable. No pancreatic ductal dilatation or surrounding inflammatory changes. Spleen: Normal in size without focal abnormality. Adrenals/Urinary Tract: Normal adrenal glands. Urinary bladder is unremarkable but slightly limited evaluation due to artifact from left hip replacement. Normal appearance of both kidneys without hydronephrosis. Stomach/Bowel: Extensive diverticulosis in the sigmoid colon and left colon without acute colonic inflammation. Normal appendix. Moderate sized hiatal hernia. Normal appearance of the duodenum. Dilated small bowel loops in the left abdomen compatible with jejunum. There is mesenteric edema associated with these dilated small bowel loops. Small bowel loops measure up to 3.3 cm. Distal small bowel is decompressed. Small bowel transition point in the left abdomen near the mesenteric edema. Transition point best seen on the coronal reformats, sequence 5 image 42 and sagittal reformats, sequence 6, image 84. Irregularity of the mesentery and fat near the bowel obstruction and raises concern for a closed loop obstruction and possibly an internal hernia. No evidence for pneumatosis or portal air. Vascular/Lymphatic:  Atherosclerotic disease in the abdominal aorta without aneurysm. No abdominopelvic lymphadenopathy. Reproductive: Uterus and bilateral adnexa are unremarkable. Other: Small  amount of free fluid in the pelvis. Minimal fluid in the left paracolic gutter. Mesenteric edema in the left abdomen associated with the dilated small bowel loops. Negative for free air. Musculoskeletal: No acute bone abnormality in the spine. Extensive facet arthropathy. IMPRESSION: 1. Small bowel obstruction. Dilated loops of small bowel in the left abdomen with mesenteric edema and well-defined transition point. Findings raise concern for a closed loop obstruction, possibly related to an internal hernia. Small amount of fluid in the abdomen and pelvis. 2. Colonic diverticulosis without acute colonic inflammation. 3. Moderate sized hiatal hernia. These results were called by telephone at the time of interpretation on 01/12/2019 at 12:08 pm to Dr. Alvira MondayERIN SCHLOSSMAN , who verbally acknowledged these results. Electronically Signed   By: Richarda OverlieAdam  Henn M.D.   On: 01/12/2019 12:16   Dg Abd 2 Views  Result Date: 01/19/2019 CLINICAL DATA:  Recent exploratory laparotomy with lysis of adhesions for small-bowel obstruction. Abdominal distension. EXAM: ABDOMEN - 2 VIEW COMPARISON:  01/16/2019 FINDINGS: The enteric tube has been removed. Postsurgical changes are again noted with skin staples remaining in place. Moderate to severe diffuse small bowel dilatation is similar to the prior study. Gas is present in nondilated colon. Residual oral contrast material has migrated distally and is now located in the descending and sigmoid colon. No intraperitoneal free air is identified. Prior left hip arthroplasty is noted. IMPRESSION: Unchanged small bowel dilatation concerning for obstruction. Electronically Signed   By: Sebastian AcheAllen  Grady M.D.   On: 01/19/2019 14:25   Dg Abd Portable 1v  Result Date: 02/03/2019 CLINICAL DATA:  Postoperative ileus EXAM: PORTABLE ABDOMEN  - 1 VIEW COMPARISON:  Portable exam 1021 hours compared to 01/28/2019 FINDINGS: Distended air-filled small bowel loops throughout abdomen. Scattered gas in colon. Findings are consistent with a postoperative ileus, little changed. No definite bowel wall thickening. Bones demineralized with degenerative changes of the spine and RIGHT hip. LEFT hip prosthesis noted. IMPRESSION: Persistent postoperative ileus. Electronically Signed   By: Ulyses SouthwardMark  Boles M.D.   On: 02/03/2019 10:37   Dg Abd Portable 1v  Result Date: 01/28/2019 CLINICAL DATA:  Ileus follow-up. EXAM: PORTABLE ABDOMEN - 1 VIEW COMPARISON:  Abdominal x-ray dated January 25, 2019. FINDINGS: Unchanged enteric tube terminating in the distal esophagus. Persistent diffuse small bowel dilatation up to 5.7 cm, minimally improved. No radio-opaque calculi or other significant radiographic abnormality are seen. IMPRESSION: 1. Persistent ileus, minimally improved. 2. Unchanged enteric tube with the tip in the distal esophagus. Recommend advancement. Electronically Signed   By: Obie DredgeWilliam T Derry M.D.   On: 01/28/2019 15:35   Dg Abd Portable 1v  Result Date: 01/25/2019 CLINICAL DATA:  Ileus EXAM: PORTABLE ABDOMEN - 1 VIEW COMPARISON:  01/24/2019 abdominal radiograph FINDINGS: Enteric tube terminates at the esophagogastric junction. Vertical skin staples overlie the medial left abdomen. Partially visualized left total hip arthroplasty. Prominent diffuse small bowel dilatation up to the 6.1 cm diameter, minimally improved. No evidence of pneumatosis or pneumoperitoneum. Minimal colonic stool. No radiopaque nephrolithiasis. IMPRESSION: 1. Enteric tube terminates at the esophagogastric junction. 2. Prominent diffuse small bowel dilatation, minimally improved, compatible with severe postoperative adynamic ileus. Electronically Signed   By: Delbert PhenixJason A Poff M.D.   On: 01/25/2019 07:09   Dg Abd Portable 1v  Result Date: 01/24/2019 CLINICAL DATA:  Vomiting.  Abdominal pain.  EXAM: PORTABLE ABDOMEN - 1 VIEW COMPARISON:  01/23/2019.  01/19/2019. FINDINGS: Surgical staples noted over the abdomen. Persistent prominently dilated small bowel loops are again noted. Small-bowel distention may have increased  from prior exam. Paucity of intra colonic air. No free air. Degenerative changes lumbar spine right hip. Total left hip replacement. IMPRESSION: Persistent prominent dilated small bowel loops are again noted. Small-bowel distention may have progressed from prior exam. Findings consistent small bowel obstruction. Electronically Signed   By: Maisie Fus  Register   On: 01/24/2019 07:16   Dg Abd Portable 1v  Result Date: 01/23/2019 CLINICAL DATA:  Vomiting, ileus postop EXAM: PORTABLE ABDOMEN - 1 VIEW COMPARISON:  01/19/2019 FINDINGS: Marked gaseous distention of predominantly small bowel loops. Gas noted within nondistended stomach and large bowel. Appearance is concerning for small bowel obstruction. No visible free air organomegaly. IMPRESSION: Continued marked dilatation of small bowel loops concerning for persistent small bowel obstruction. Electronically Signed   By: Charlett Nose M.D.   On: 01/23/2019 19:50   Dg Abd Portable 1v  Result Date: 01/23/2019 CLINICAL DATA:  NG placement EXAM: PORTABLE ABDOMEN - 1 VIEW COMPARISON:  01/23/2019 FINDINGS: NG tip within hiatal hernia.  NG above the diaphragm. Dilated small bowel loops with mild improvement since earlier today. Skin staples in the midline of the abdomen. IMPRESSION: NG tip coiled in hiatal hernia.  Decrease small bowel dilatation. Electronically Signed   By: Marlan Palau M.D.   On: 01/23/2019 18:36   Dg Abd Portable 1v  Result Date: 01/14/2019 CLINICAL DATA:  Small-bowel obstruction EXAM: PORTABLE ABDOMEN - 1 VIEW COMPARISON:  01/13/2019 FINDINGS: Gastric catheter is noted within the stomach. Persistent and increased small-bowel dilatation is noted when compare with the previous day. Some colonic gas is noted and stable. No  free air is seen. IMPRESSION: Increasing small bowel dilatation when compared with the previous day Electronically Signed   By: Alcide Clever M.D.   On: 01/14/2019 08:46   Dg Abd Portable 1v-small Bowel Obstruction Protocol-initial, 8 Hr Delay  Result Date: 01/13/2019 CLINICAL DATA:  Small-bowel obstruction. EXAM: PORTABLE ABDOMEN - 1 VIEW COMPARISON:  01/13/2019 FINDINGS: The enteric tube projects over the gastric body. The patient's oral contrast is not well appreciated on this exam and is favored to remain within the small bowel. There are multiple dilated loops of small bowel measuring up to approximately 4.5 cm. There is no definite pneumatosis or free air. There is a moderate amount of stool in the ascending colon. The bladder appears to be partially distended with contrast. IMPRESSION: 1. Oral contrast is not well appreciated on this exam but is favored to reside within the small bowel. No definite oral contrast visualized in the colon. 2. Persistently dilated loops of small bowel measuring up to approximately 4.5 cm in diameter. 3. Enteric tube projects over the left upper quadrant. Electronically Signed   By: Katherine Mantle M.D.   On: 01/13/2019 20:00   Dg Abd Portable 1v-small Bowel Obstruction Protocol-initial, 8 Hr Delay  Result Date: 01/13/2019 CLINICAL DATA:  Small bowel obstruction. EXAM: PORTABLE ABDOMEN - 1 VIEW COMPARISON:  One-view abdomen 01/12/2019 FINDINGS: NG tube remains in place. Dilated loops of small bowel are slightly less prominent than on the prior study. Gas is present throughout the colon. Contrast is noted within the urinary bladder. Left hip replacement is evident. IMPRESSION: 1. Improving small bowel obstruction with NG tube in place. Electronically Signed   By: Marin Roberts M.D.   On: 01/13/2019 07:42   Dg Abd Portable 1v-small Bowel Protocol-position Verification  Result Date: 01/12/2019 CLINICAL DATA:  85 year old female with history of nasogastric tube  placement. Evaluate for bowel obstruction. EXAM: PORTABLE ABDOMEN - 1 VIEW COMPARISON:  CT the abdomen and pelvis 01/12/2019. FINDINGS: Multiple prominent mildly dilated loops of gas-filled small bowel in the central abdomen measuring up to 3.4 cm in diameter. A small amount of gas and stool is noted in the colon. No pneumoperitoneum on this single supine view. Nasogastric tube extends into the proximal body of the stomach. Either native contrast filling the urinary bladder, related to recent contrast enhanced CT examination. Status post left hip arthroplasty. IMPRESSION: 1. Tip of nasogastric tube is in the proximal body of the stomach. 2. Bowel gas pattern again suggests early or partial small bowel obstruction. Electronically Signed   By: Vinnie Langton M.D.   On: 01/12/2019 19:09   Dg Addison Bailey G Tube Plc W/fl W/rad  Result Date: 01/24/2019 CLINICAL DATA:  Small-bowel distention.  NG tube for decompression. EXAM: NASO G TUBE PLACEMENT WITH FL AND WITH RAD CONTRAST:  None. FLUOROSCOPY TIME:  Fluoroscopy Time:  0 minutes and 48 seconds. Radiation Exposure Index (if provided by the fluoroscopic device): 16.7 mGy Number of Acquired Spot Images: 0 COMPARISON:  Abdomen film from 01/24/2020. Prior NG tube placement 01/15/2019. FINDINGS: NG tube was placed via the patient's right nostril and easily directed into the patient's known moderate to large hiatal hernia. Multiple attempts were made to direct the NG tube through the hernia into the subdiaphragmatic portion of the stomach, but these were unsuccessful. IMPRESSION: Successful NG tube placement into the supradiaphragmatic portion of the stomach in this patient with moderate to large hiatal hernia. As before, the tube could not be directed into the infra diaphragmatic portion of the stomach due to orientation of the hiatal hernia. I discussed this study with the patient's nurse Beth, at 1451 hours on 01/24/2019. Electronically Signed   By: Misty Stanley M.D.   On:  01/24/2019 14:46   Dg Intro Long Gi Tube  Result Date: 01/15/2019 CLINICAL DATA:  Small bowel obstruction.  Recent lysis of adhesions. EXAM: INTRO LONG GI TUBE CONTRAST:  82mL OMNIPAQUE IOHEXOL 300 MG/ML  SOLN FLUOROSCOPY TIME:  Fluoroscopy Time:  12 Radiation Exposure Index (if provided by the fluoroscopic device): 203.4 mGy Number of Acquired Spot Images: 1 COMPARISON:  CT 01/12/2019 FINDINGS: The patient arrived with new G-tube in the distal esophagus. Patient has a large hiatal hernia. Multiple attempts were made to pass the NG tube through the hiatal hernia. Contrast and air were administered into the hernia/stomach. A guidewire was employed. Patient was repositioned in multiple position. Ultimately, the tip of the NG tube could not be passed into the stomach due to the orientation of the orifice of the hiatal hernia. The gastric contents and administer contrast were aspirated easily back out of the hernia sac and stomach. IMPRESSION: NG tube position within the large hiatal hernia. Tube could not be passed into the proximal stomach due to the anatomy of the hernia. The tube will likely decompress the stomach in the current position. Findings conveyed topatient's floor nurse Tracyon 01/15/2019 at15:29. Electronically Signed   By: Suzy Bouchard M.D.   On: 01/15/2019 16:30   [place] Ekg Site Rite  Result Date: 01/14/2019 If Site Rite image not attached, placement could not be confirmed due to current cardiac rhythm.    Antimicrobials:   None currently   Subjective: Patient still with poor oral intake Reports just not having any appetite  Objective: Vitals:   02/04/19 0107 02/04/19 0541 02/04/19 0930 02/04/19 1401  BP: 117/63 111/69 123/67 125/71  Pulse: 82 81 84 93  Resp: 17 17 16  17  Temp: 98.6 F (37 C) 98.6 F (37 C) 98.1 F (36.7 C) 98.1 F (36.7 C)  TempSrc: Oral Oral Oral Oral  SpO2: 98% 97% 98% 99%  Weight:      Height:        Intake/Output Summary (Last 24 hours) at  02/04/2019 1408 Last data filed at 02/04/2019 0953 Gross per 24 hour  Intake 1547.52 ml  Output 900 ml  Net 647.52 ml   Filed Weights   01/12/19 0929 01/21/19 1215 02/03/19 0535  Weight: 67.6 kg 71.9 kg 68.8 kg    Examination:  General exam:unchanged, appears calm and comfortable  Respiratory system: Clear to auscultation. Respiratory effort normal. Cardiovascular system:S1 &S2 heard, RRR. No JVD, murmurs, rubs, gallops or clicks. No pedal edema. Gastrointestinal system:Quiet bowel sounds, soft mildly tender to palpation diffuselywithoutfocal findings, no rebound no guarding not rigid Central nervous system:Alert and oriented. No focal neurological deficits. Extremities:Moves all 4 extremities freely no focal neurological deficits Skin: No rashes, lesions or ulcers Psychiatry:Judgement and insight appear normal. Mood &affect appropriate.     Data Reviewed: I have personally reviewed following labs and imaging studies  CBC: Recent Labs  Lab 01/29/19 0410 02/03/19 0457  WBC 6.3 5.2  NEUTROABS 4.3 2.5  HGB 9.3* 8.9*  HCT 31.8* 29.9*  MCV 85.7 84.2  PLT 318 242   Basic Metabolic Panel: Recent Labs  Lab 01/30/19 0345 01/31/19 0344 02/01/19 0400 02/02/19 0357 02/03/19 0457  NA 135 134* 132* 134* 135  K 4.6 4.6 4.2 4.3 4.4  CL 101 103 100 101 102  CO2 25 23 22 25 24   GLUCOSE 162* 134* 142* 143* 133*  BUN 30* 27* 26* 27* 28*  CREATININE 0.60 0.57 0.54 0.58 0.59  CALCIUM 8.3* 8.1* 8.2* 8.2* 8.1*  MG 2.1  --   --   --  2.2  PHOS 3.3  --   --   --  3.5   GFR: Estimated Creatinine Clearance: 45.5 mL/min (by C-G formula based on SCr of 0.59 mg/dL). Liver Function Tests: Recent Labs  Lab 01/30/19 0345 02/03/19 0457  AST 24 19  ALT 29 23  ALKPHOS 61 64  BILITOT <0.1* 0.1*  PROT 5.5* 5.4*  ALBUMIN 2.6* 2.6*   No results for input(s): LIPASE, AMYLASE in the last 168 hours. No results for input(s): AMMONIA in the last 168 hours. Coagulation Profile: No  results for input(s): INR, PROTIME in the last 168 hours. Cardiac Enzymes: No results for input(s): CKTOTAL, CKMB, CKMBINDEX, TROPONINI in the last 168 hours. BNP (last 3 results) No results for input(s): PROBNP in the last 8760 hours. HbA1C: No results for input(s): HGBA1C in the last 72 hours. CBG: Recent Labs  Lab 02/02/19 2122 02/03/19 0543 02/03/19 2146 02/04/19 0539 02/04/19 1404  GLUCAP 146* 152* 139* 130* 131*   Lipid Profile: Recent Labs    02/03/19 0457  TRIG 93   Thyroid Function Tests: No results for input(s): TSH, T4TOTAL, FREET4, T3FREE, THYROIDAB in the last 72 hours. Anemia Panel: No results for input(s): VITAMINB12, FOLATE, FERRITIN, TIBC, IRON, RETICCTPCT in the last 72 hours. Sepsis Labs: No results for input(s): PROCALCITON, LATICACIDVEN in the last 168 hours.  No results found for this or any previous visit (from the past 240 hour(s)).       Radiology Studies: Dg Abd Portable 1v  Result Date: 02/03/2019 CLINICAL DATA:  Postoperative ileus EXAM: PORTABLE ABDOMEN - 1 VIEW COMPARISON:  Portable exam 1021 hours compared to 01/28/2019 FINDINGS: Distended air-filled small bowel loops  throughout abdomen. Scattered gas in colon. Findings are consistent with a postoperative ileus, little changed. No definite bowel wall thickening. Bones demineralized with degenerative changes of the spine and RIGHT hip. LEFT hip prosthesis noted. IMPRESSION: Persistent postoperative ileus. Electronically Signed   By: Ulyses SouthwardMark  Boles M.D.   On: 02/03/2019 10:37        Scheduled Meds:  calcium carbonate  1 tablet Oral TID WC   docusate sodium  100 mg Oral BID   feeding supplement  1 Container Oral BID BM   feeding supplement (ENSURE ENLIVE)  237 mL Oral BID BM   heparin  5,000 Units Subcutaneous Q8H   insulin aspart  0-15 Units Subcutaneous Q8H   latanoprost  1 drop Both Eyes QHS   levothyroxine  50 mcg Oral Q0600   loratadine  10 mg Oral Daily   megestrol  20  mg Oral BID   metoCLOPramide  5 mg Oral TID AC   multivitamin with minerals  1 tablet Oral Daily   pantoprazole  40 mg Oral BID   polyethylene glycol  17 g Oral Daily   sodium chloride flush  10-40 mL Intracatheter Q12H   Continuous Infusions:  lactated ringers 10 mL/hr at 02/02/19 1800   TPN ADULT (ION) 75 mL/hr at 02/03/19 1820     LOS: 23 days    Time spent: 35 min    Burke Keelshristopher Asiah Befort, MD [facility]  If 7PM-7AM, please contact night-coverage  02/04/2019, 2:08 PM

## 2019-02-05 LAB — GLUCOSE, CAPILLARY
Glucose-Capillary: 120 mg/dL — ABNORMAL HIGH (ref 70–99)
Glucose-Capillary: 135 mg/dL — ABNORMAL HIGH (ref 70–99)
Glucose-Capillary: 132 mg/dL — ABNORMAL HIGH (ref 70–99)

## 2019-02-05 MED ORDER — TRAVASOL 10 % IV SOLN
INTRAVENOUS | Status: AC
  Administered 2019-02-05: 18:00:00 via INTRAVENOUS

## 2019-02-05 NOTE — Progress Notes (Signed)
PROGRESS NOTE    Christie ChampionMarian Hintz  ZOX:[id number] DOB: 1928-03-25 DOA: 01/12/2019 PCP: Sharren BridgeSellers, Billie H, NP     Brief Narrative:  Christie Hinton is a 85 year old female who presented abdominal pain, nausea or vomiting. She does have significant past medical history for hypertension, dyslipidemia and GERD. She reported generalized abdominal pain after a meal, associated with vomiting. She presented to the hospital due to progressive abdominal pain. On her initial physical examination her temperature was 97.7, heart rate 76, blood pressure 166/82, oxygen saturation 99%. Her lungs are clear to auscultation bilaterally, heart S1-S2 present rhythmic, the abdomen was soft, generalized abdominal pain to palpation, hypoactive bowel sounds, no lower extremity edema. CT of the abdomen with a small bowel obstruction, dilated loops of small bowel in the left abdomen with mesenteric edema and a well-defined transition point.Findings concerning for closed-loop obstruction, possibly related to an internal hernia. Patient was admitted to the hospital working diagnosis of small bowel obstruction. Underwent exploratory laparotomy on 01/14/19, lysis of adhesions. Complicated with post operative ileus.   New events last 24 hours / Subjective: Remains on TPN, PO intake remains low.  No complaints of nausea or vomiting today.  No complaints of abdominal pain  Assessment & Plan:   Principal Problem:   SBO (small bowel obstruction) s/p lysis of adhesions 01/14/2019 Active Problems:   Essential hypertension   Mixed hyperlipidemia   Small bowel obstructionsp exploratory laparotomy 01/14/19, with post operative ileus. -General surgery following, decrease TPN to try to stimulate appetite and p.o. intake.  Megace started.  Calorie count in process.    HTN -Blood pressure well controlled, 106/59 this morning  Hypothyroidism -Continues Synthroid  Dyslipidemia -Statin therapy on discharge  T2DM -Continue  sliding scale insulin  DVT prophylaxis: Subcutaneous heparin Code Status: Full code Family Communication: Spoke with son over the phone Disposition Plan: Home health PT on discharge, once p.o. intake adequate and weaned off TPN   Consultants:   General surgery   Procedures:   Status post ex lap with lysis of adhesion for small bowel obstruction, omental adhesion, internal hernia with Dr. Derrell Lollingamirez 01/14/2019  Antimicrobials:  Anti-infectives (From admission, onward)   Start     Dose/Rate Route Frequency Ordered Stop   01/14/19 1103  sodium chloride 0.9 % with cefoTEtan (CEFOTAN) ADS Med    Note to Pharmacy: Vevelyn RoyalsHarvell, Gwendolyn  : cabinet override      01/14/19 1103 01/14/19 1124   01/14/19 1045  cefoTEtan (CEFOTAN) 2 g in sodium chloride 0.9 % 100 mL IVPB     2 g 200 mL/hr over 30 Minutes Intravenous  Once 01/14/19 1044 01/14/19 1124        Objective: Vitals:   02/04/19 1734 02/04/19 2106 02/05/19 0459 02/05/19 0949  BP: 119/65 107/75 100/65 (!) 106/59  Pulse: 95 98 84 88  Resp: 14 19 19 18   Temp: 97.9 F (36.6 C) 98.7 F (37.1 C) 98.9 F (37.2 C) 98.4 F (36.9 C)  TempSrc: Oral Oral Oral Oral  SpO2: 98% 97% 98% 99%  Weight:      Height:        Intake/Output Summary (Last 24 hours) at 02/05/2019 1256 Last data filed at 02/05/2019 1000 Gross per 24 hour  Intake 2667.45 ml  Output 0 ml  Net 2667.45 ml   Filed Weights   01/12/19 0929 01/21/19 1215 02/03/19 0535  Weight: 67.6 kg 71.9 kg 68.8 kg    Examination:  General exam: Appears calm and comfortable, sitting in chair Respiratory system: Clear  to auscultation. Respiratory effort normal. Cardiovascular system: S1 & S2 heard, RRR. No JVD, murmurs, rubs, gallops or clicks. No pedal edema. Gastrointestinal system: Abdomen is nondistended, soft and nontender. No organomegaly or masses felt. Normal bowel sounds heard.  Midline incision clean and dry with Steri-Strips Central nervous system: Alert and oriented. No focal  neurological deficits. Extremities: Symmetric 5 x 5 power. Skin: No rashes, lesions or ulcers Psychiatry: Judgement and insight appear normal. Mood & affect appropriate.   Data Reviewed: I have personally reviewed following labs and imaging studies  CBC: Recent Labs  Lab 02/03/19 0457  WBC 5.2  NEUTROABS 2.5  HGB 8.9*  HCT 29.9*  MCV 84.2  PLT 245   Basic Metabolic Panel: Recent Labs  Lab 01/30/19 0345 01/31/19 0344 02/01/19 0400 02/02/19 0357 02/03/19 0457  NA 135 134* 132* 134* 135  K 4.6 4.6 4.2 4.3 4.4  CL 101 103 100 101 102  CO2 25 23 22 25 24   GLUCOSE 162* 134* 142* 143* 133*  BUN 30* 27* 26* 27* 28*  CREATININE 0.60 0.57 0.54 0.58 0.59  CALCIUM 8.3* 8.1* 8.2* 8.2* 8.1*  MG 2.1  --   --   --  2.2  PHOS 3.3  --   --   --  3.5   GFR: Estimated Creatinine Clearance: 45.5 mL/min (by C-G formula based on SCr of 0.59 mg/dL). Liver Function Tests: Recent Labs  Lab 01/30/19 0345 02/03/19 0457  AST 24 19  ALT 29 23  ALKPHOS 61 64  BILITOT <0.1* 0.1*  PROT 5.5* 5.4*  ALBUMIN 2.6* 2.6*   No results for input(s): LIPASE, AMYLASE in the last 168 hours. No results for input(s): AMMONIA in the last 168 hours. Coagulation Profile: No results for input(s): INR, PROTIME in the last 168 hours. Cardiac Enzymes: No results for input(s): CKTOTAL, CKMB, CKMBINDEX, TROPONINI in the last 168 hours. BNP (last 3 results) No results for input(s): PROBNP in the last 8760 hours. HbA1C: No results for input(s): HGBA1C in the last 72 hours. CBG: Recent Labs  Lab 02/03/19 2146 02/04/19 0539 02/04/19 1404 02/04/19 2147 02/05/19 0457  GLUCAP 139* 130* 131* 141* 120*   Lipid Profile: Recent Labs    02/03/19 0457  TRIG 93   Thyroid Function Tests: No results for input(s): TSH, T4TOTAL, FREET4, T3FREE, THYROIDAB in the last 72 hours. Anemia Panel: No results for input(s): VITAMINB12, FOLATE, FERRITIN, TIBC, IRON, RETICCTPCT in the last 72 hours. Sepsis Labs: No  results for input(s): PROCALCITON, LATICACIDVEN in the last 168 hours.  No results found for this or any previous visit (from the past 240 hour(s)).     Radiology Studies: No results found.    Scheduled Meds: . calcium carbonate  1 tablet Oral TID WC  . docusate sodium  100 mg Oral BID  . feeding supplement  1 Container Oral BID BM  . feeding supplement (ENSURE ENLIVE)  237 mL Oral BID BM  . heparin  5,000 Units Subcutaneous Q8H  . insulin aspart  0-15 Units Subcutaneous Q8H  . latanoprost  1 drop Both Eyes QHS  . levothyroxine  50 mcg Oral Q0600  . loratadine  10 mg Oral Daily  . megestrol  20 mg Oral BID  . metoCLOPramide  5 mg Oral TID AC  . multivitamin with minerals  1 tablet Oral Daily  . pantoprazole  40 mg Oral BID  . polyethylene glycol  17 g Oral Daily  . sodium chloride flush  10-40 mL Intracatheter Q12H  Continuous Infusions: . lactated ringers 10 mL/hr at 02/05/19 0600  . TPN ADULT (ION) 40 mL/hr at 02/04/19 1800  . TPN ADULT (ION)       LOS: 24 days    Time spent: 35 minutes   Noralee StainJennifer Julitza Rickles, DO [facility] [email or web address] 02/05/2019, 12:56 PM

## 2019-02-05 NOTE — Progress Notes (Signed)
PHARMACY - ADULT TOTAL PARENTERAL NUTRITION CONSULT NOTE   Pharmacy Consult for TPN Indication: Post-operative ileus following surgery for SBO  HPI: 85 y/o F with SBO underwent laparotomy and lysis of adhesions on 01/14/19, now on TPN. Hx of DM (diet-controlled) noted.  Significant events:  6/14: continue TPN, start CL soon and per CCS note may start to wean TPN soon if tolerated 6/16: started clear liquids 6/17: TPN reduced to 1/2 rate per surgery 6/18: +ileus, attempting to insert NGT but pt refusing.   6/20: Increase TPN back to full dose per surgery 6/22: repeat clamping trial but later developed nausea 6/24: NG removed, starting CLD, bowel regimen 6/25: advance to FLD 6/27: BM, advance to soft diet, add Breeze TID; starting 48 hr calorie count 6/29: eating minimally; having stools; KUB persistent postop ileus 6/30: TPN reduced to 1/2 rate per surgery and Megace started.   Patient Measurements: Height: 5' 6.5" (168.9 cm) Weight: 151 lb 11.2 oz (68.8 kg) IBW/kg (Calculated) : 60.45 TPN AdjBW (KG): 67.6 Body mass index is 24.12 kg/m.    Vitals:   02/04/19 2106 02/05/19 0459  BP: 107/75 100/65  Pulse: 98 84  Resp: 19 19  Temp: 98.7 F (37.1 C) 98.9 F (37.2 C)  SpO2: 97% 98%    Intake/Output Summary (Last 24 hours) at 02/05/2019 0910 Last data filed at 02/05/2019 0900 Gross per 24 hour  Intake 2707.45 ml  Output 0 ml  Net 2707.45 ml    Current Nutrition: Soft diet, TPN @ 40 mL/hr. Charted as taking 1 Boost Breeze and 1 Ensure Enlive yesterday. IVF: LR @ 72ml/hr Central access: PICC  TPN start date: 6/10  Recent Labs    02/03/19 0457  NA 135  K 4.4  CL 102  CO2 24  GLUCOSE 133*  BUN 28*  CREATININE 0.59  CALCIUM 8.1*  PHOS 3.5  MG 2.2  ALBUMIN 2.6*  ALKPHOS 64  AST 19  ALT 23  BILITOT 0.1*  TRIG 93  PREALBUMIN 16.5*   Assessment:  Glucose (goal CBG<150)  Hx DM - diet controlled  CBGs acceptable 120-141  Received 4 units sensitive SSI in  past 24 hrs; 20 units in TPN 1.8 L bag (but infusing only 960 mL since rate was reduced to 40 mL/hr)  Electrolytes - on 6/29: stable, WNL  Renal - SCr WNL, BUN slightly elevated, CrCl ~ 45 mL/min  LFTs - on 6/29: all  below ULN  TGs - on 6/29: WNL  Prealbumin - now improving 14.1>> 12.3> 16.5 (6/29)   Plan:                                                                                                        At 1800:  Continue custom TPN at 40 mL/hr (~1/2 of goal rate, to help stimulate appetite);  Reduce insulin in TPN to 10 units (since bag volume will change to 960 mL when prepared today)  Continue PO MVI w/minerals Continue moderate SSI, w/ q8 hr CBG checks  IVF per MD TPN lab panels on Mondays & Thursdays -  due tomorrow  Elie Goodyandy Julyssa Kyer, PharmD, BCPS [phone] 02/05/2019  9:10 AM

## 2019-02-05 NOTE — Progress Notes (Signed)
PT Cancellation Note  Patient Details Name: Fidela Cieslak MRN: [id number] DOB: 1928-07-13   Cancelled Treatment:    Reason Eval/Treat Not Completed: Attempted PT tx session. Pt politely declined 2* not feeling well. Will check back another day   Weston Anna, [place] Pager: [phone] Office: [phone]

## 2019-02-05 NOTE — Progress Notes (Signed)
[facility] Progress Note  22 Days Post-Op  Subjective: CC: poor appetite Patient ate some fruit last night, not particularly hungry so far this AM. Denies nausea. Had some diarrhea yesterday. Discussed with patient that if she wanted something specific that someone could bring for her, that would be ok.   Objective: Vital signs in last 24 hours: Temp:  [97.9 F (36.6 C)-98.9 F (37.2 C)] 98.9 F (37.2 C) (07/01 0459) Pulse Rate:  [84-98] 84 (07/01 0459) Resp:  [14-19] 19 (07/01 0459) BP: (100-125)/(65-75) 100/65 (07/01 0459) SpO2:  [97 %-99 %] 98 % (07/01 0459) Last BM Date: 02/04/19  Intake/Output from previous day: 06/30 0701 - 07/01 0700 In: 2527.5 [P.O.:420; I.V.:2107.5] Out: 0  Intake/Output this shift: No intake/output data recorded.  PE: Gen: Alert, NAD, pleasant Card: Regular rate and rhythm Pulm: Normal effort, clear to auscultation bilaterally Abd: Soft, non-tender,non-distended,+BS, incision c/d/i with steri-strips present Skin: warm and dry, no rashes  Psych: A&Ox3   Lab Results:  Recent Labs    02/03/19 0457  WBC 5.2  HGB 8.9*  HCT 29.9*  PLT 242   BMET Recent Labs    02/03/19 0457  NA 135  K 4.4  CL 102  CO2 24  GLUCOSE 133*  BUN 28*  CREATININE 0.59  CALCIUM 8.1*   PT/INR No results for input(s): LABPROT, INR in the last 72 hours. CMP     Component Value Date/Time   NA 135 02/03/2019 0457   K 4.4 02/03/2019 0457   CL 102 02/03/2019 0457   CO2 24 02/03/2019 0457   GLUCOSE 133 (H) 02/03/2019 0457   BUN 28 (H) 02/03/2019 0457   CREATININE 0.59 02/03/2019 0457   CALCIUM 8.1 (L) 02/03/2019 0457   PROT 5.4 (L) 02/03/2019 0457   ALBUMIN 2.6 (L) 02/03/2019 0457   AST 19 02/03/2019 0457   ALT 23 02/03/2019 0457   ALKPHOS 64 02/03/2019 0457   BILITOT 0.1 (L) 02/03/2019 0457   GFRNONAA >60 02/03/2019 0457   GFRAA >60 02/03/2019 0457   Lipase     Component Value Date/Time   LIPASE 29 01/12/2019 0928        Studies/Results: Dg Abd Portable 1v  Result Date: 02/03/2019 CLINICAL DATA:  Postoperative ileus EXAM: PORTABLE ABDOMEN - 1 VIEW COMPARISON:  Portable exam 1021 hours compared to 01/28/2019 FINDINGS: Distended air-filled small bowel loops throughout abdomen. Scattered gas in colon. Findings are consistent with a postoperative ileus, little changed. No definite bowel wall thickening. Bones demineralized with degenerative changes of the spine and RIGHT hip. LEFT hip prosthesis noted. IMPRESSION: Persistent postoperative ileus. Electronically Signed   By: Lavonia Dana M.D.   On: 02/03/2019 10:37    Anti-infectives: Anti-infectives (From admission, onward)   Start     Dose/Rate Route Frequency Ordered Stop   01/14/19 1103  sodium chloride 0.9 % with cefoTEtan (CEFOTAN) ADS Med    Note to Pharmacy: [name]  : cabinet override      01/14/19 1103 01/14/19 1124   01/14/19 1045  cefoTEtan (CEFOTAN) 2 g in sodium chloride 0.9 % 100 mL IVPB     2 g 200 mL/hr over 30 Minutes Intravenous  Once 01/14/19 1044 01/14/19 1124       Assessment/Plan Thyroid disease HTN HLD DM Protein Calorie Malnutrition- Pre-albumin16.4 6/29  SBO S/p Ex Lap with LOA for SBO due to Omental Adhesion,internal hernia - Dr. Rosendo Gros - 01/14/2019 - POD #22 - Mobilize for bowel function - Pulm toliet - Keep K>4 and  Mg >2 for bowel function - Soft diet, continue boost - dietician following as well, continue reduced TPN rate to try to stimulate more PO intake, megace started yesterday as well - continue calorie count - continue scheduled low dose reglan   FEN - TPN, Soft, Bowel Regimen (Miralax, Colace) VTE - Heparin, SCDs, Mobilize  ID - Cefotetan periop. None currently. Afebrile. Foley - None  Follow up- Dr. Derrell Lollingamirez  POC - Son - Rockie Neighboursd Bly [phone] -Patient's son and daughter plan to be with her when she returns home. Daughter during the day and son at night.  LOS: 24  days    Wells GuilesKelly Rayburn , [facility] 02/05/2019, 8:13 AM Pager: [phone] Consults: [phone]

## 2019-02-06 ENCOUNTER — Inpatient Hospital Stay (HOSPITAL_COMMUNITY): Payer: Medicare Other

## 2019-02-06 LAB — CBC
HCT: 30.5 % — ABNORMAL LOW (ref 36.0–46.0)
Hemoglobin: 9.5 g/dL — ABNORMAL LOW (ref 12.0–15.0)
MCH: 25.8 pg — ABNORMAL LOW (ref 26.0–34.0)
MCHC: 31.1 g/dL (ref 30.0–36.0)
MCV: 82.9 fL (ref 80.0–100.0)
Platelets: 248 10*3/uL (ref 150–400)
RBC: 3.68 MIL/uL — ABNORMAL LOW (ref 3.87–5.11)
RDW: 16.4 % — ABNORMAL HIGH (ref 11.5–15.5)
WBC: 6 10*3/uL (ref 4.0–10.5)
nRBC: 0 % (ref 0.0–0.2)

## 2019-02-06 LAB — COMPREHENSIVE METABOLIC PANEL
ALT: 27 U/L (ref 0–44)
AST: 22 U/L (ref 15–41)
Albumin: 3 g/dL — ABNORMAL LOW (ref 3.5–5.0)
Alkaline Phosphatase: 71 U/L (ref 38–126)
Anion gap: 10 (ref 5–15)
BUN: 23 mg/dL (ref 8–23)
CO2: 23 mmol/L (ref 22–32)
Calcium: 8.5 mg/dL — ABNORMAL LOW (ref 8.9–10.3)
Chloride: 102 mmol/L (ref 98–111)
Creatinine, Ser: 0.67 mg/dL (ref 0.44–1.00)
GFR calc Af Amer: >60 mL/min (ref >60)
GFR calc non Af Amer: >60 mL/min (ref >60)
Glucose, Bld: 119 mg/dL — ABNORMAL HIGH (ref 70–99)
Potassium: 4.2 mmol/L (ref 3.5–5.1)
Sodium: 135 mmol/L (ref 135–145)
Total Bilirubin: 0.2 mg/dL — ABNORMAL LOW (ref 0.3–1.2)
Total Protein: 5.8 g/dL — ABNORMAL LOW (ref 6.5–8.1)

## 2019-02-06 LAB — GLUCOSE, CAPILLARY
Glucose-Capillary: 124 mg/dL — ABNORMAL HIGH (ref 70–99)
Glucose-Capillary: 118 mg/dL — ABNORMAL HIGH (ref 70–99)
Glucose-Capillary: 124 mg/dL — ABNORMAL HIGH (ref 70–99)

## 2019-02-06 LAB — PHOSPHORUS: Phosphorus: 3.6 mg/dL (ref 2.5–4.6)

## 2019-02-06 LAB — MAGNESIUM: Magnesium: 1.9 mg/dL (ref 1.7–2.4)

## 2019-02-06 MED ORDER — SODIUM CHLORIDE (PF) 0.9 % IJ SOLN
INTRAMUSCULAR | Status: AC
  Filled 2019-02-06: qty 50

## 2019-02-06 MED ORDER — IOHEXOL 300 MG/ML  SOLN
100.0000 mL | Freq: Once | INTRAMUSCULAR | Status: AC | PRN
  Administered 2019-02-06: 100 mL via INTRAVENOUS

## 2019-02-06 MED ORDER — TECHNETIUM TC 99M SULFUR COLLOID
2.1000 | Freq: Once | INTRAVENOUS | Status: AC | PRN
  Administered 2019-02-06: 2.1 via INTRAVENOUS

## 2019-02-06 MED ORDER — IOHEXOL 300 MG/ML  SOLN
30.0000 mL | Freq: Once | INTRAMUSCULAR | Status: AC
  Administered 2019-02-06: 30 mL via ORAL

## 2019-02-06 MED ORDER — TRAVASOL 10 % IV SOLN
INTRAVENOUS | Status: AC
  Administered 2019-02-06: 18:00:00 via INTRAVENOUS

## 2019-02-06 NOTE — Progress Notes (Signed)
PHARMACY - ADULT TOTAL PARENTERAL NUTRITION CONSULT NOTE   Pharmacy Consult for TPN Indication: Post-operative ileus following surgery for SBO  HPI: 85 y/o F with SBO underwent laparotomy and lysis of adhesions on 01/14/19, now on TPN. Hx of DM (diet-controlled) noted.  Significant events:  6/14: continue TPN, start CL soon and per CCS note may start to wean TPN soon if tolerated 6/16: started clear liquids 6/17: TPN reduced to 1/2 rate per surgery 6/18: +ileus, attempting to insert NGT but pt refusing.   6/20: Increase TPN back to full dose per surgery 6/22: repeat clamping trial but later developed nausea 6/24: NG removed, starting CLD, bowel regimen 6/25: advance to FLD 6/27: BM, advance to soft diet, add Breeze TID; starting 48 hr calorie count 6/29: eating minimally; having stools; KUB persistent postop ileus 6/30: TPN reduced to 1/2 rate per surgery and Megace started. 7/2:   Persistent N/V.  For gastric emptying study today.    Patient Measurements: Height: 5' 6.5" (168.9 cm) Weight: 151 lb 11.2 oz (68.8 kg) IBW/kg (Calculated) : 60.45 TPN AdjBW (KG): 67.6 Body mass index is 24.12 kg/m.    Vitals:   02/05/19 2225 02/06/19 0539  BP: 97/66 111/67  Pulse: 88 81  Resp: 16 18  Temp: 98.6 F (37 C) 98.1 F (36.7 C)  SpO2: 97% 97%    Intake/Output Summary (Last 24 hours) at 02/06/2019 7341 Last data filed at 02/06/2019 9379 Gross per 24 hour  Intake 1388.14 ml  Output 300 ml  Net 1088.14 ml    Current Nutrition: Soft diet, TPN @ 40 mL/hr. Charted as taking 1 Boost Breeze and 1 Ensure Enlive in past 24 hrs.  IVF: LR @ 24ml/hr Central access: PICC  TPN start date: 6/10  Estimated Nutritional Needs per RD assessment: Kcal: 0240 - 2025 / day Protein: 80-95 grams/day Fluid: >/= 1.8 L/day  Recent Labs    02/06/19 0436  NA 135  K 4.2  CL 102  CO2 23  GLUCOSE 119*  BUN 23  CREATININE 0.67  CALCIUM 8.5*  PHOS 3.6  MG 1.9  ALBUMIN 3.0*  ALKPHOS 71  AST 22   ALT 27  BILITOT 0.2*   Assessment:  Glucose (goal CBG<150)  Hx DM - diet controlled  CBGs over past 24 hrs: 120-135  Received 6 units sensitive SSI in past 24 hrs; 10 units in TPN 0.96 L bag   Electrolytes - WNL  Renal - SCr and BUN WNL, CrCl ~ 45 mL/min  LFTs - all below ULN  TGs - on 6/29: WNL  Prealbumin - now improving 14.1>> 12.3> 16.5 (6/29)  For gastric emptying study today to evaluate persistent N/V.   Plan:                                                                                                        At 1800:  Continue custom TPN at 40 mL/hr (~1/2 of goal rate, to help stimulate appetite);  Continue PO MVI w/minerals Continue moderate SSI q8h  IVF per MD TPN lab panels on Mondays &  Thursdays Follow daily for tolerance of PO diet and potential for discontinuation of TPN.  Elie Goodyandy Johnica Armwood, PharmD, BCPS [phone] 02/06/2019  6:52 AM

## 2019-02-06 NOTE — Progress Notes (Signed)
[facility] Progress Note  23 Days Post-Op  Subjective: CC: vomited overnight Patient vomited last night, denies nausea this AM. Still reports discomfort in epigastrium at times. Passing flatus, no BM yesterday.   Objective: Vital signs in last 24 hours: Temp:  [98.1 F (36.7 C)-99.2 F (37.3 C)] 98.1 F (36.7 C) (07/02 0539) Pulse Rate:  [81-92] 81 (07/02 0539) Resp:  [16-18] 18 (07/02 0539) BP: (97-113)/(59-69) 111/67 (07/02 0539) SpO2:  [97 %-99 %] 97 % (07/02 0539) Last BM Date: 02/04/19  Intake/Output from previous day: 07/01 0701 - 07/02 0700 In: 1388.1 [P.O.:390; I.V.:998.1] Out: 300 [Emesis/NG output:300] Intake/Output this shift: No intake/output data recorded.  PE: Gen: Alert, NAD, pleasant Card: Regular rate and rhythm Pulm: Normal effort, clear to auscultation bilaterally Abd: Soft, non-tender,non-distended,+BS, incision c/d/i with steri-strips present Skin: warm and dry, no rashes  Psych: A&Ox3  Lab Results:  Recent Labs    02/06/19 0436  WBC 6.0  HGB 9.5*  HCT 30.5*  PLT 248   BMET Recent Labs    02/06/19 0436  NA 135  K 4.2  CL 102  CO2 23  GLUCOSE 119*  BUN 23  CREATININE 0.67  CALCIUM 8.5*   PT/INR No results for input(s): LABPROT, INR in the last 72 hours. CMP     Component Value Date/Time   NA 135 02/06/2019 0436   K 4.2 02/06/2019 0436   CL 102 02/06/2019 0436   CO2 23 02/06/2019 0436   GLUCOSE 119 (H) 02/06/2019 0436   BUN 23 02/06/2019 0436   CREATININE 0.67 02/06/2019 0436   CALCIUM 8.5 (L) 02/06/2019 0436   PROT 5.8 (L) 02/06/2019 0436   ALBUMIN 3.0 (L) 02/06/2019 0436   AST 22 02/06/2019 0436   ALT 27 02/06/2019 0436   ALKPHOS 71 02/06/2019 0436   BILITOT 0.2 (L) 02/06/2019 0436   GFRNONAA >60 02/06/2019 0436   GFRAA >60 02/06/2019 0436   Lipase     Component Value Date/Time   LIPASE 29 01/12/2019 0928       Studies/Results: No results found.  Anti-infectives: Anti-infectives (From  admission, onward)   Start     Dose/Rate Route Frequency Ordered Stop   01/14/19 1103  sodium chloride 0.9 % with cefoTEtan (CEFOTAN) ADS Med    Note to Pharmacy: Christie Hinton, Christie Hinton  : cabinet override      01/14/19 1103 01/14/19 1124   01/14/19 1045  cefoTEtan (CEFOTAN) 2 g in sodium chloride 0.9 % 100 mL IVPB     2 g 200 mL/hr over 30 Minutes Intravenous  Once 01/14/19 1044 01/14/19 1124       Assessment/Plan Thyroid disease HTN HLD DM Protein Calorie Malnutrition- Pre-albumin16.46/29  SBO S/p Ex Lap with LOA for SBO due to Omental Adhesion,internal hernia - Dr. Derrell Lollingamirez - 01/14/2019 - POD #23 - Mobilize for bowel function - Pulm toliet - Keep K>4 and Mg >2 for bowel function - Soft diet, continue boost -dietician following as well, continue reduced TPN rate to try to stimulate more PO intake, megace started yesterday as well - continue calorie count -continue scheduled low dose reglan - gastric emptying study today    FEN - TPN, Soft, Bowel Regimen (Miralax, Colace) VTE - Heparin, SCDs, Mobilize  ID - Cefotetan periop. None currently. Afebrile. Foley - None  Follow up- Dr. Derrell Lollingamirez  POC - Son - Rockie Neighboursd Bly [phone] -Patient's son and daughter plan to be with her when she returns home. Daughter during the day and son at night.  LOS: 25 days    Christie Hinton , [facility] 02/06/2019, 7:16 AM Pager: [phone] Consults: [phone]

## 2019-02-06 NOTE — Progress Notes (Signed)
Patient has drank contrast and is ready for her CT, I call to inform CT but no one answers the phone, will pass on this information to night shift so that they can follow up with CT.

## 2019-02-06 NOTE — Progress Notes (Signed)
Unable to complete gastric emptying study, she vomited the eggs.   She says she just could not eat what they gave her.  Abdomen is soft, not distended or tender.  She had a BM today.  Discussed with Dr. Brantley Stage and we will repeat CT tonight with contrast to see if there is we can find an issue we can correct.  She is currently on TPN @40ml /hr.

## 2019-02-06 NOTE — Progress Notes (Addendum)
PROGRESS NOTE    Christie Hinton  XBM:[id number] DOB: 06/30/1928 DOA: 01/12/2019 PCP: Belia Heman, NP     Brief Narrative:  Christie Hinton is a 85 year old female who presented abdominal pain, nausea or vomiting. She does have significant past medical history for hypertension, dyslipidemia and GERD. She reported generalized abdominal pain after a meal, associated with vomiting. She presented to the hospital due to progressive abdominal pain. On her initial physical examination her temperature was 97.7, heart rate 76, blood pressure 166/82, oxygen saturation 99%. Her lungs are clear to auscultation bilaterally, heart S1-S2 present rhythmic, the abdomen was soft, generalized abdominal pain to palpation, hypoactive bowel sounds, no lower extremity edema. CT of the abdomen with a small bowel obstruction, dilated loops of small bowel in the left abdomen with mesenteric edema and a well-defined transition point.Findings concerning for closed-loop obstruction, possibly related to an internal hernia. Patient was admitted to the hospital working diagnosis of small bowel obstruction. Underwent exploratory laparotomy on 01/14/19, lysis of adhesions. Complicated with post operative ileus.   New events last 24 hours / Subjective: Remains on TPN. No nausea today. Planning for gastric emptying study later today  Assessment & Plan:   Principal Problem:   SBO (small bowel obstruction) s/p lysis of adhesions 01/14/2019 Active Problems:   Essential hypertension   Mixed hyperlipidemia   Small bowel obstructionsp exploratory laparotomy 01/14/19, with post operative ileus. -General surgery following, decrease TPN to try to stimulate appetite and p.o. intake.  Megace started.  Calorie count in process.   -Gastric emptying study today   HTN -Blood pressure well controlled, 111/67 this morning  Hypothyroidism -Continues Synthroid  Dyslipidemia -Statin therapy on discharge  T2DM -Continue  sliding scale insulin   DVT prophylaxis: Subcutaneous heparin Code Status: Full code Family Communication: Spoke with son over the phone today  Disposition Plan: Home health PT on discharge, once p.o. intake adequate and weaned off TPN   Consultants:   General surgery   Procedures:   Status post ex lap with lysis of adhesion for small bowel obstruction, omental adhesion, internal hernia with Dr. Rosendo Gros 01/14/2019  Antimicrobials:  Anti-infectives (From admission, onward)   Start     Dose/Rate Route Frequency Ordered Stop   01/14/19 1103  sodium chloride 0.9 % with cefoTEtan (CEFOTAN) ADS Med    Note to Pharmacy: [name]  : cabinet override      01/14/19 1103 01/14/19 1124   01/14/19 1045  cefoTEtan (CEFOTAN) 2 g in sodium chloride 0.9 % 100 mL IVPB     2 g 200 mL/hr over 30 Minutes Intravenous  Once 01/14/19 1044 01/14/19 1124       Objective: Vitals:   02/05/19 1337 02/05/19 1753 02/05/19 2225 02/06/19 0539  BP: 111/66 113/69 97/66 111/67  Pulse: 92 90 88 81  Resp: 17 17 16 18   Temp: 98.7 F (37.1 C) 99.2 F (37.3 C) 98.6 F (37 C) 98.1 F (36.7 C)  TempSrc: Oral Oral Oral Oral  SpO2: 99% 98% 97% 97%  Weight:      Height:        Intake/Output Summary (Last 24 hours) at 02/06/2019 1142 Last data filed at 02/06/2019 1000 Gross per 24 hour  Intake 1405.26 ml  Output 300 ml  Net 1105.26 ml   Filed Weights   01/12/19 0929 01/21/19 1215 02/03/19 0535  Weight: 67.6 kg 71.9 kg 68.8 kg    Examination: General exam: Appears calm and comfortable  Respiratory system: Clear to auscultation. Respiratory effort normal.  Cardiovascular system: S1 & S2 heard, RRR. No JVD, murmurs, rubs, gallops or clicks. No pedal edema. Gastrointestinal system: Abdomen is nondistended, soft and nontender. No organomegaly or masses felt. Normal bowel sounds heard. Central nervous system: Alert and oriented. No focal neurological deficits. Extremities: Symmetric 5 x 5 power.  Skin: No rashes, lesions or ulcers Psychiatry: Judgement and insight appear normal. Mood & affect appropriate.    Data Reviewed: I have personally reviewed following labs and imaging studies  CBC: Recent Labs  Lab 02/03/19 0457 02/06/19 0436  WBC 5.2 6.0  NEUTROABS 2.5  --   HGB 8.9* 9.5*  HCT 29.9* 30.5*  MCV 84.2 82.9  PLT 242 248   Basic Metabolic Panel: Recent Labs  Lab 01/31/19 0344 02/01/19 0400 02/02/19 0357 02/03/19 0457 02/06/19 0436  NA 134* 132* 134* 135 135  K 4.6 4.2 4.3 4.4 4.2  CL 103 100 101 102 102  CO2 23 22 25 24 23   GLUCOSE 134* 142* 143* 133* 119*  BUN 27* 26* 27* 28* 23  CREATININE 0.57 0.54 0.58 0.59 0.67  CALCIUM 8.1* 8.2* 8.2* 8.1* 8.5*  MG  --   --   --  2.2 1.9  PHOS  --   --   --  3.5 3.6   GFR: Estimated Creatinine Clearance: 45.5 mL/min (by C-G formula based on SCr of 0.67 mg/dL). Liver Function Tests: Recent Labs  Lab 02/03/19 0457 02/06/19 0436  AST 19 22  ALT 23 27  ALKPHOS 64 71  BILITOT 0.1* 0.2*  PROT 5.4* 5.8*  ALBUMIN 2.6* 3.0*   No results for input(s): LIPASE, AMYLASE in the last 168 hours. No results for input(s): AMMONIA in the last 168 hours. Coagulation Profile: No results for input(s): INR, PROTIME in the last 168 hours. Cardiac Enzymes: No results for input(s): CKTOTAL, CKMB, CKMBINDEX, TROPONINI in the last 168 hours. BNP (last 3 results) No results for input(s): PROBNP in the last 8760 hours. HbA1C: No results for input(s): HGBA1C in the last 72 hours. CBG: Recent Labs  Lab 02/04/19 2147 02/05/19 0457 02/05/19 1410 02/05/19 2223 02/06/19 0604  GLUCAP 141* 120* 135* 132* 124*   Lipid Profile: No results for input(s): CHOL, HDL, LDLCALC, TRIG, CHOLHDL, LDLDIRECT in the last 72 hours. Thyroid Function Tests: No results for input(s): TSH, T4TOTAL, FREET4, T3FREE, THYROIDAB in the last 72 hours. Anemia Panel: No results for input(s): VITAMINB12, FOLATE, FERRITIN, TIBC, IRON, RETICCTPCT in the last  72 hours. Sepsis Labs: No results for input(s): PROCALCITON, LATICACIDVEN in the last 168 hours.  No results found for this or any previous visit (from the past 240 hour(s)).     Radiology Studies: No results found.    Scheduled Meds: . calcium carbonate  1 tablet Oral TID WC  . docusate sodium  100 mg Oral BID  . feeding supplement  1 Container Oral BID BM  . feeding supplement (ENSURE ENLIVE)  237 mL Oral BID BM  . heparin  5,000 Units Subcutaneous Q8H  . insulin aspart  0-15 Units Subcutaneous Q8H  . latanoprost  1 drop Both Eyes QHS  . levothyroxine  50 mcg Oral Q0600  . loratadine  10 mg Oral Daily  . megestrol  20 mg Oral BID  . metoCLOPramide  5 mg Oral TID AC  . multivitamin with minerals  1 tablet Oral Daily  . pantoprazole  40 mg Oral BID  . polyethylene glycol  17 g Oral Daily  . sodium chloride flush  10-40 mL Intracatheter Q12H  Continuous Infusions: . lactated ringers 10 mL/hr at 02/05/19 0600  . TPN ADULT (ION) 40 mL/hr at 02/05/19 1817  . TPN ADULT (ION)       LOS: 25 days    Time spent: 25 minutes   Noralee StainJennifer Tennyson Kallen, DO [facility] [email or web address] 02/06/2019, 11:42 AM

## 2019-02-07 LAB — GLUCOSE, CAPILLARY
Glucose-Capillary: 115 mg/dL — ABNORMAL HIGH (ref 70–99)
Glucose-Capillary: 132 mg/dL — ABNORMAL HIGH (ref 70–99)
Glucose-Capillary: 132 mg/dL — ABNORMAL HIGH (ref 70–99)

## 2019-02-07 MED ORDER — TRAVASOL 10 % IV SOLN
INTRAVENOUS | Status: AC
  Administered 2019-02-07: 19:00:00 via INTRAVENOUS

## 2019-02-07 NOTE — Progress Notes (Signed)
24 Days Post-Op   Subjective/Chief Complaint: No nausea. Had CT yesterday Some burping but lots of flatus, had 2 BMs Reports when at home she eats small meals, if trys to eat large meals - will get n, fullness, discomfort   Objective: Vital signs in last 24 hours: Temp:  [97.4 F (36.3 C)-98.3 F (36.8 C)] 98.2 F (36.8 C) (07/03 0456) Pulse Rate:  [80-84] 84 (07/03 0456) Resp:  [15-18] 17 (07/03 0456) BP: (107-157)/(69-137) 118/69 (07/03 0456) SpO2:  [97 %-99 %] 97 % (07/03 0456) Last BM Date: 02/06/19  Intake/Output from previous day: 07/02 0701 - 07/03 0700 In: 1477.2 [P.O.:300; I.V.:1177.2] Out: 0  Intake/Output this shift: No intake/output data recorded.  Alert, sitting in chair, nontoxic cta Reg Soft, NT  Lab Results:  Recent Labs    02/06/19 0436  WBC 6.0  HGB 9.5*  HCT 30.5*  PLT 248   BMET Recent Labs    02/06/19 0436  NA 135  K 4.2  CL 102  CO2 23  GLUCOSE 119*  BUN 23  CREATININE 0.67  CALCIUM 8.5*   PT/INR No results for input(s): LABPROT, INR in the last 72 hours. ABG No results for input(s): PHART, HCO3 in the last 72 hours.  Invalid input(s): PCO2, PO2  Studies/Results: Ct Abdomen Pelvis W Contrast  Result Date: 02/06/2019 CLINICAL DATA:  Nausea and vomiting EXAM: CT ABDOMEN AND PELVIS WITH CONTRAST TECHNIQUE: Multidetector CT imaging of the abdomen and pelvis was performed using the standard protocol following bolus administration of intravenous contrast. CONTRAST:  162mL OMNIPAQUE IOHEXOL 300 MG/ML  SOLN COMPARISON:  CT dated January 12, 2019. FINDINGS: Lower chest: The lung bases are clear. The heart size is normal. Hepatobiliary: There is a stable hypoattenuating area in hepatic segment 6, likely representing a benign process such as a cyst or hemangioma. Normal gallbladder.There is stable extrahepatic biliary ductal dilatation. Pancreas: Normal contours without ductal dilatation. No peripancreatic fluid collection. Spleen: No splenic  laceration or hematoma. Adrenals/Urinary Tract: --Adrenal glands: No adrenal hemorrhage. --Right kidney/ureter: No hydronephrosis or perinephric hematoma. --Left kidney/ureter: No hydronephrosis or perinephric hematoma. --Urinary bladder: Unremarkable. Stomach/Bowel: --Stomach/Duodenum: There is a large hiatal hernia, similar to prior study. There is some mild gastric wall thickening of unknown clinical significance. --Small bowel: There are few dilated loops of small bowel measuring up to approximately 3.5-4 cm. There are air-fluid levels involving these small bowel loops. There appears to be a transition point in the left lower quadrant. --Colon: Rectosigmoid diverticulosis without acute inflammation. --Appendix: Normal. Vascular/Lymphatic: Atherosclerotic calcification is present within the non-aneurysmal abdominal aorta, without hemodynamically significant stenosis. There is a retroaortic left renal vein, a normal variant. --No retroperitoneal lymphadenopathy. --No mesenteric lymphadenopathy. --No pelvic or inguinal lymphadenopathy. Reproductive: Unremarkable Other: No ascites or free air. The abdominal wall is normal. Musculoskeletal. Multilevel degenerative disc disease and facet arthrosis. No bony spinal canal stenosis. Patient is status post total hip arthroplasty on the left without evidence of a periprosthetic fracture involving visualized portions of the left femur. IMPRESSION: 1. Again noted are dilated loops of small bowel with a transition point in the left lower quadrant. Findings are consistent with a low to moderate grade small bowel obstruction. 2. Large hiatal hernia. 3. Apparent wall thickening of the stomach which is similar to prior study and is of unknown clinical significance. This may be secondary to overall underdistention. 4. Sigmoid diverticulosis without CT evidence of diverticulitis. Electronically Signed   By: Constance Holster M.D.   On: 02/06/2019 20:21  Anti-infectives: Anti-infectives (From admission, onward)   Start     Dose/Rate Route Frequency Ordered Stop   01/14/19 1103  sodium chloride 0.9 % with cefoTEtan (CEFOTAN) ADS Med    Note to Pharmacy: Vevelyn RoyalsHarvell, Gwendolyn  : cabinet override      01/14/19 1103 01/14/19 1124   01/14/19 1045  cefoTEtan (CEFOTAN) 2 g in sodium chloride 0.9 % 100 mL IVPB     2 g 200 mL/hr over 30 Minutes Intravenous  Once 01/14/19 1044 01/14/19 1124      Assessment/Plan: Thyroid disease HTN HLD DM Protein Calorie Malnutrition- Pre-albumin16.46/29  SBO/ileus S/p Ex Lap with LOA for SBO due to Omental Adhesion,internal hernia - Dr. Derrell Lollingamirez - 01/14/2019 - POD #24 - Mobilize for bowel function - Pulm toliet - Keep K>4 and Mg >2 for bowel function - start Full liquid, continue boost -dietician following as well,continue reducedTPN rate to try to stimulate more PO intake, megace started yesterday as well - continue calorie count -continue scheduled low dose reglan - She has a moderate hiatal hernia, in speaking with her she eats very small meals at home bc if she tries to eat more, she will have issues, I think her recent episodes of n/v are probably more due to her hiatal hernia than what is possibly going on in her LLQ (but that is probably contributing to issues). She does have some mild SB dilation on CT. However she is having flatus/BMs.  I predict she may need to stay on full liquid diet for awhile. Would like to see if she meets protein intake on FLD over the next few days and can make decision about TPN at that time.    FEN - TPN,  Full liquid diet Bowel Regimen (Miralax, Colace) VTE - Heparin, SCDs, Mobilize  ID - Cefotetan periop. None currently. Afebrile. Foley - None  Follow up- Dr. Derrell Lollingamirez  POC - Son - Rockie Neighboursd Bly [phone]) -Patient's son and daughter plan to be with her when she returns home. Daughter during the day and son at night.   LOS: 26 days    Gaynelle Aduric  Sherree Shankman 02/07/2019

## 2019-02-07 NOTE — Progress Notes (Signed)
PHARMACY - ADULT TOTAL PARENTERAL NUTRITION CONSULT NOTE   Pharmacy Consult for TPN Indication: Post-operative ileus following surgery for SBO  HPI: 85 y/o F with SBO underwent laparotomy and lysis of adhesions on 01/14/19, now on TPN. Hx of DM (diet-controlled) noted.  Significant events:  6/14: continue TPN, start CL soon and per CCS note may start to wean TPN soon if tolerated 6/16: started clear liquids 6/17: TPN reduced to 1/2 rate per surgery 6/18: +ileus, attempting to insert NGT but pt refusing.   6/20: Increase TPN back to full dose per surgery 6/22: repeat clamping trial but later developed nausea 6/24: NG removed, starting CLD, bowel regimen 6/25: advance to FLD 6/27: BM, advance to soft diet, add Breeze TID; starting 48 hr calorie count 6/29: eating minimally; having stools; KUB persistent postop ileus 6/30: TPN reduced to 1/2 rate per surgery and Megace started. 7/2:   Still w/ intermittent N/V.  Attempted gastric emptying study but pt unable to tolerate soft diet.  CT, findings included low to moderate grade SBO, large hiatal hernia. 7/3:   No nausea, now on FL diet. Per surgery, plan to see if pt has adequate protein intake on FL diet over next few days, then make decision regarding TPN.   Patient Measurements: Height: 5' 6.5" (168.9 cm) Weight: 151 lb 11.2 oz (68.8 kg) IBW/kg (Calculated) : 60.45 TPN AdjBW (KG): 67.6 Body mass index is 24.12 kg/m.    Vitals:   02/06/19 2029 02/07/19 0456  BP: 118/71 118/69  Pulse: 80 84  Resp: 15 17  Temp: 98.3 F (36.8 C) 98.2 F (36.8 C)  SpO2: 99% 97%    Intake/Output Summary (Last 24 hours) at 02/07/2019 0848 Last data filed at 02/07/2019 0600 Gross per 24 hour  Intake 1477.18 ml  Output 0 ml  Net 1477.18 ml    Current Nutrition: FL diet, TPN @ 40 mL/hr IVF: LR @ 1210ml/hr Central access: PICC  TPN start date: 6/10  Estimated Nutritional Needs per RD assessment: Kcal: 1750 - 2025 / day Protein: 80-95  grams/day Fluid: >/= 1.8 L/day  Insulin Requirements:  Received 4 units moderate SSI in past 24 hrs; 10 units insulin in TPN 0.96 L bag    Recent Labs    02/06/19 0436  NA 135  K 4.2  CL 102  CO2 23  GLUCOSE 119*  BUN 23  CREATININE 0.67  CALCIUM 8.5*  PHOS 3.6  MG 1.9  ALBUMIN 3.0*  ALKPHOS 71  AST 22  ALT 27  BILITOT 0.2*   Assessment:  Glucose (goal CBG<150)  Hx DM - diet controlled  CBGs over past 24 hrs: 115 - 124 on SSI + insulin in TPN  Electrolytes - WNL (7/2)  Renal - SCr and BUN WNL (7/2), CrCl ~ 45 mL/min  LFTs - all below ULN (7/2)  TGs - WNL (6/29)  Prealbumin - improving 14.1>>  12.3 > 16.5 (6/29)  Surgery plans to leave on FL diet for next few days then reassess before making decision regarding TPN    Plan:  At 1800:  Continue custom TPN at 40 mL/hr (~1/2 of goal rate, to help stimulate appetite)  Continue insulin 10 units / 0.96 L bag Continue MVI w/minerals by PO route Continue moderate SSI q8h  IVF per MD TPN lab panels on Mondays & Thursdays Follow daily    Clayburn Pert, PharmD, BCPS [phone] 02/07/2019  8:48 AM

## 2019-02-07 NOTE — Progress Notes (Signed)
Physical Therapy Treatment Patient Details Name: Anastasio ChampionMarian Limes MRN: [id number] DOB: March 13, 1928 Today's Date: 02/07/2019    History of Present Illness 85 yo female admitted with SBO. S/P lysis of adhesions 01/14/19. Hx of DM    PT Comments    Pt OOB in recliner.  Feeling "Okay"  Assisted with amba full unit with 4ww.  Tolerated distance well.    Follow Up Recommendations  Home health PT;Supervision - Intermittent     Equipment Recommendations  None recommended by PT(has a rollator at home)    Recommendations for Other Services       Precautions / Restrictions Precautions Precautions: Fall Restrictions Weight Bearing Restrictions: No    Mobility  Bed Mobility               General bed mobility comments: OOB in recliner  Transfers Overall transfer level: Needs assistance Equipment used: 4-wheeled walker Transfers: Sit to/from Stand Sit to Stand: Supervision         General transfer comment: for safety. cues hand placement, proper operation of rollator  Ambulation/Gait Ambulation/Gait assistance: Min guard;Supervision Gait Distance (Feet): 500 Feet Assistive device: 4-wheeled walker Gait Pattern/deviations: Step-through pattern;Decreased stride length     General Gait Details: tolerated distance well   Stairs             Wheelchair Mobility    Modified Rankin (Stroke Patients Only)       Balance                                            Cognition   Behavior During Therapy: WFL for tasks assessed/performed Overall Cognitive Status: Within Functional Limits for tasks assessed                                        Exercises      General Comments        Pertinent Vitals/Pain Pain Assessment: No/denies pain Pain Location: just feels slight nauseated    Home Living                      Prior Function            PT Goals (current goals can now be found in the care plan section)  Progress towards PT goals: Progressing toward goals    Frequency    Min 3X/week      PT Plan Current plan remains appropriate    Co-evaluation              AM-PAC PT "6 Clicks" Mobility   Outcome Measure  Help needed turning from your back to your side while in a flat bed without using bedrails?: A Little Help needed moving from lying on your back to sitting on the side of a flat bed without using bedrails?: A Little Help needed moving to and from a bed to a chair (including a wheelchair)?: A Little Help needed standing up from a chair using your arms (e.g., wheelchair or bedside chair)?: A Little Help needed to walk in hospital room?: A Little Help needed climbing 3-5 steps with a railing? : A Little 6 Click Score: 18    End of Session Equipment Utilized During Treatment: Gait belt Activity Tolerance: Patient tolerated treatment well Patient left: in chair;with call  bell/phone within reach Nurse Communication: Mobility status PT Visit Diagnosis: Unsteadiness on feet (R26.81);Muscle weakness (generalized) (M62.81)     Time: 0051-1021 PT Time Calculation (min) (ACUTE ONLY): 15 min  Charges:  $Gait Training: 8-22 mins                     Rica Koyanagi  PTA Acute  Rehabilitation Services Pager      [phone] Office      [phone]

## 2019-02-07 NOTE — Progress Notes (Signed)
PROGRESS NOTE    Christie ChampionMarian Filsinger  NFA:[id number] DOB: [birth year] DOA: 01/12/2019 PCP: Sharren BridgeSellers, Billie H, NP     Brief Narrative:  Christie Hinton is a 85 year old female who presented abdominal pain, nausea or vomiting. She does have significant past medical history for hypertension, dyslipidemia and GERD. She reported generalized abdominal pain after a meal, associated with vomiting. She presented to the hospital due to progressive abdominal pain. On her initial physical examination her temperature was 97.7, heart rate 76, blood pressure 166/82, oxygen saturation 99%. Her lungs are clear to auscultation bilaterally, heart S1-S2 present rhythmic, the abdomen was soft, generalized abdominal pain to palpation, hypoactive bowel sounds, no lower extremity edema. CT of the abdomen with a small bowel obstruction, dilated loops of small bowel in the left abdomen with mesenteric edema and a well-defined transition point.Findings concerning for closed-loop obstruction, possibly related to an internal hernia. Patient was admitted to the hospital working diagnosis of small bowel obstruction. Underwent exploratory laparotomy on 01/14/19, lysis of adhesions. Complicated with post operative ileus.   New events last 24 hours / Subjective: No new complaints today, having small bowel movements, passing gas  Assessment & Plan:   Principal Problem:   SBO (small bowel obstruction) s/p lysis of adhesions 01/14/2019 Active Problems:   Essential hypertension   Mixed hyperlipidemia   Small bowel obstructionsp exploratory laparotomy 01/14/19, with post operative ileus. -General surgery following, decrease TPN to try to stimulate appetite and p.o. intake.  Megace started.  Calorie count in process.   -Able to tolerate gastric emptying study -CT abdomen pelvis completed yesterday which revealed dilated loops of small bowel, low to moderate grade small bowel obstruction, large hiatal hernia, wall thickening of the  stomach, sigmoid diverticulosis without diverticulitis -General surgery planning to keep patient on full liquid diet, continue reduced rate of TPN  HTN -Blood pressure well controlled  Hypothyroidism -Continues Synthroid  Dyslipidemia -Statin therapy on discharge  T2DM -Continue sliding scale insulin   DVT prophylaxis: Subcutaneous heparin Code Status: Full code Family Communication: Spoke with son and daughter-in-law over the phone today  Disposition Plan: Home health PT on discharge, once p.o. intake adequate and weaned off TPN.  Full liquid diet over the weekend   Consultants:   General surgery   Procedures:   Status post ex lap with lysis of adhesion for small bowel obstruction, omental adhesion, internal hernia with Dr. Derrell Lollingamirez 01/14/2019  Antimicrobials:  Anti-infectives (From admission, onward)   Start     Dose/Rate Route Frequency Ordered Stop   01/14/19 1103  sodium chloride 0.9 % with cefoTEtan (CEFOTAN) ADS Med    Note to Pharmacy: Vevelyn RoyalsHarvell, Gwendolyn  : cabinet override      01/14/19 1103 01/14/19 1124   01/14/19 1045  cefoTEtan (CEFOTAN) 2 g in sodium chloride 0.9 % 100 mL IVPB     2 g 200 mL/hr over 30 Minutes Intravenous  Once 01/14/19 1044 01/14/19 1124       Objective: Vitals:   02/06/19 1505 02/06/19 1508 02/06/19 2029 02/07/19 0456  BP: (!) 157/137 107/76 118/71 118/69  Pulse: 82 83 80 84  Resp: 18  15 17   Temp: (!) 97.4 F (36.3 C)  98.3 F (36.8 C) 98.2 F (36.8 C)  TempSrc: Oral  Oral Oral  SpO2: 99%  99% 97%  Weight:      Height:        Intake/Output Summary (Last 24 hours) at 02/07/2019 1105 Last data filed at 02/07/2019 1000 Gross per 24 hour  Intake  1714.02 ml  Output 100 ml  Net 1614.02 ml   Filed Weights   01/12/19 0929 01/21/19 1215 02/03/19 0535  Weight: 67.6 kg 71.9 kg 68.8 kg    Examination: General exam: Appears calm and comfortable  Respiratory system: Clear to auscultation. Respiratory effort normal.  Cardiovascular system: S1 & S2 heard, RRR. No JVD, murmurs, rubs, gallops or clicks. No pedal edema. Gastrointestinal system: Abdomen is nondistended, soft and nontender. No organomegaly or masses felt. Normal bowel sounds heard. Central nervous system: Alert and oriented. No focal neurological deficits. Extremities: Symmetric 5 x 5 power. Skin: No rashes, lesions or ulcers Psychiatry: Judgement and insight appear normal. Mood & affect appropriate.    Data Reviewed: I have personally reviewed following labs and imaging studies  CBC: Recent Labs  Lab 02/03/19 0457 02/06/19 0436  WBC 5.2 6.0  NEUTROABS 2.5  --   HGB 8.9* 9.5*  HCT 29.9* 30.5*  MCV 84.2 82.9  PLT 242 248   Basic Metabolic Panel: Recent Labs  Lab 02/01/19 0400 02/02/19 0357 02/03/19 0457 02/06/19 0436  NA 132* 134* 135 135  K 4.2 4.3 4.4 4.2  CL 100 101 102 102  CO2 22 25 24 23   GLUCOSE 142* 143* 133* 119*  BUN 26* 27* 28* 23  CREATININE 0.54 0.58 0.59 0.67  CALCIUM 8.2* 8.2* 8.1* 8.5*  MG  --   --  2.2 1.9  PHOS  --   --  3.5 3.6   GFR: Estimated Creatinine Clearance: 45.5 mL/min (by C-G formula based on SCr of 0.67 mg/dL). Liver Function Tests: Recent Labs  Lab 02/03/19 0457 02/06/19 0436  AST 19 22  ALT 23 27  ALKPHOS 64 71  BILITOT 0.1* 0.2*  PROT 5.4* 5.8*  ALBUMIN 2.6* 3.0*   No results for input(s): LIPASE, AMYLASE in the last 168 hours. No results for input(s): AMMONIA in the last 168 hours. Coagulation Profile: No results for input(s): INR, PROTIME in the last 168 hours. Cardiac Enzymes: No results for input(s): CKTOTAL, CKMB, CKMBINDEX, TROPONINI in the last 168 hours. BNP (last 3 results) No results for input(s): PROBNP in the last 8760 hours. HbA1C: No results for input(s): HGBA1C in the last 72 hours. CBG: Recent Labs  Lab 02/05/19 2223 02/06/19 0604 02/06/19 1424 02/06/19 2301 02/07/19 0702  GLUCAP 132* 124* 118* 124* 115*   Lipid Profile: No results for input(s):  CHOL, HDL, LDLCALC, TRIG, CHOLHDL, LDLDIRECT in the last 72 hours. Thyroid Function Tests: No results for input(s): TSH, T4TOTAL, FREET4, T3FREE, THYROIDAB in the last 72 hours. Anemia Panel: No results for input(s): VITAMINB12, FOLATE, FERRITIN, TIBC, IRON, RETICCTPCT in the last 72 hours. Sepsis Labs: No results for input(s): PROCALCITON, LATICACIDVEN in the last 168 hours.  No results found for this or any previous visit (from the past 240 hour(s)).     Radiology Studies: Ct Abdomen Pelvis W Contrast  Result Date: 02/06/2019 CLINICAL DATA:  Nausea and vomiting EXAM: CT ABDOMEN AND PELVIS WITH CONTRAST TECHNIQUE: Multidetector CT imaging of the abdomen and pelvis was performed using the standard protocol following bolus administration of intravenous contrast. CONTRAST:  100mL OMNIPAQUE IOHEXOL 300 MG/ML  SOLN COMPARISON:  CT dated January 12, 2019. FINDINGS: Lower chest: The lung bases are clear. The heart size is normal. Hepatobiliary: There is a stable hypoattenuating area in hepatic segment 6, likely representing a benign process such as a cyst or hemangioma. Normal gallbladder.There is stable extrahepatic biliary ductal dilatation. Pancreas: Normal contours without ductal dilatation. No peripancreatic fluid  collection. Spleen: No splenic laceration or hematoma. Adrenals/Urinary Tract: --Adrenal glands: No adrenal hemorrhage. --Right kidney/ureter: No hydronephrosis or perinephric hematoma. --Left kidney/ureter: No hydronephrosis or perinephric hematoma. --Urinary bladder: Unremarkable. Stomach/Bowel: --Stomach/Duodenum: There is a large hiatal hernia, similar to prior study. There is some mild gastric wall thickening of unknown clinical significance. --Small bowel: There are few dilated loops of small bowel measuring up to approximately 3.5-4 cm. There are air-fluid levels involving these small bowel loops. There appears to be a transition point in the left lower quadrant. --Colon: Rectosigmoid  diverticulosis without acute inflammation. --Appendix: Normal. Vascular/Lymphatic: Atherosclerotic calcification is present within the non-aneurysmal abdominal aorta, without hemodynamically significant stenosis. There is a retroaortic left renal vein, a normal variant. --No retroperitoneal lymphadenopathy. --No mesenteric lymphadenopathy. --No pelvic or inguinal lymphadenopathy. Reproductive: Unremarkable Other: No ascites or free air. The abdominal wall is normal. Musculoskeletal. Multilevel degenerative disc disease and facet arthrosis. No bony spinal canal stenosis. Patient is status post total hip arthroplasty on the left without evidence of a periprosthetic fracture involving visualized portions of the left femur. IMPRESSION: 1. Again noted are dilated loops of small bowel with a transition point in the left lower quadrant. Findings are consistent with a low to moderate grade small bowel obstruction. 2. Large hiatal hernia. 3. Apparent wall thickening of the stomach which is similar to prior study and is of unknown clinical significance. This may be secondary to overall underdistention. 4. Sigmoid diverticulosis without CT evidence of diverticulitis. Electronically Signed   By: Constance Holster M.D.   On: 02/06/2019 20:21      Scheduled Meds: . calcium carbonate  1 tablet Oral TID WC  . docusate sodium  100 mg Oral BID  . feeding supplement  1 Container Oral BID BM  . feeding supplement (ENSURE ENLIVE)  237 mL Oral BID BM  . heparin  5,000 Units Subcutaneous Q8H  . insulin aspart  0-15 Units Subcutaneous Q8H  . latanoprost  1 drop Both Eyes QHS  . levothyroxine  50 mcg Oral Q0600  . loratadine  10 mg Oral Daily  . megestrol  20 mg Oral BID  . metoCLOPramide  5 mg Oral TID AC  . multivitamin with minerals  1 tablet Oral Daily  . pantoprazole  40 mg Oral BID  . polyethylene glycol  17 g Oral Daily  . sodium chloride flush  10-40 mL Intracatheter Q12H   Continuous Infusions: . lactated  ringers 10 mL/hr at 02/05/19 0600  . TPN ADULT (ION) 40 mL/hr at 02/06/19 1825  . TPN ADULT (ION)       LOS: 26 days    Time spent: 25 minutes   Dessa Phi, DO [facility] [email or web address] 02/07/2019, 11:05 AM

## 2019-02-08 LAB — GLUCOSE, CAPILLARY
Glucose-Capillary: 117 mg/dL — ABNORMAL HIGH (ref 70–99)
Glucose-Capillary: 133 mg/dL — ABNORMAL HIGH (ref 70–99)
Glucose-Capillary: 116 mg/dL — ABNORMAL HIGH (ref 70–99)

## 2019-02-08 MED ORDER — MEGESTROL ACETATE 400 MG/10ML PO SUSP
800.0000 mg | Freq: Every day | ORAL | Status: DC
  Administered 2019-02-08 – 2019-02-11 (×4): 800 mg via ORAL
  Filled 2019-02-08 (×4): qty 20

## 2019-02-08 MED ORDER — TRAVASOL 10 % IV SOLN
INTRAVENOUS | Status: AC
  Administered 2019-02-08: 18:00:00 via INTRAVENOUS

## 2019-02-08 NOTE — Progress Notes (Signed)
PROGRESS NOTE    Christie Hinton  BJY:[id number] DOB: 1928/06/19 DOA: 01/12/2019 PCP: Christie BridgeSellers, Billie H, NP     Brief Narrative:  Christie Hinton is a 85 year old female who presented abdominal pain, nausea or vomiting. She does have significant past medical history for hypertension, dyslipidemia and GERD. She reported generalized abdominal pain after a meal, associated with vomiting. She presented to the hospital due to progressive abdominal pain. On her initial physical examination her temperature was 97.7, heart rate 76, blood pressure 166/82, oxygen saturation 99%. Her lungs are clear to auscultation bilaterally, heart S1-S2 present rhythmic, the abdomen was soft, generalized abdominal pain to palpation, hypoactive bowel sounds, no lower extremity edema. CT of the abdomen with a small bowel obstruction, dilated loops of small bowel in the left abdomen with mesenteric edema and a well-defined transition point.Findings concerning for closed-loop obstruction, possibly related to an internal hernia. Patient was admitted to the hospital working diagnosis of small bowel obstruction. Underwent exploratory laparotomy on 01/14/19, lysis of adhesions. Complicated with post operative ileus.   New events last 24 hours / Subjective: Had some nausea yesterday, many bowel movements.  Assessment & Plan:   Principal Problem:   SBO (small bowel obstruction) s/p lysis of adhesions 01/14/2019 Active Problems:   Essential hypertension   Mixed hyperlipidemia   Small bowel obstructionsp exploratory laparotomy 01/14/19, with post operative ileus. -General surgery following, decrease TPN to try to stimulate appetite and p.o. intake.  Megace started.  Calorie count in process.   -Unable to tolerate gastric emptying study -CT abdomen pelvis completed yesterday which revealed dilated loops of small bowel, low to moderate grade small bowel obstruction, large hiatal hernia, wall thickening of the stomach, sigmoid  diverticulosis without diverticulitis -General surgery planning to keep patient on full liquid diet, continue reduced rate of TPN  HTN -Blood pressure well controlled  Hypothyroidism -Continues Synthroid  Dyslipidemia -Statin therapy on discharge  T2DM -Continue sliding scale insulin   DVT prophylaxis: Subcutaneous heparin Code Status: Full code Family Communication: None Disposition Plan: Home health PT on discharge, once p.o. intake adequate and weaned off TPN.  Full liquid diet over the weekend   Consultants:   General surgery   Procedures:   Status post ex lap with lysis of adhesion for small bowel obstruction, omental adhesion, internal hernia with Dr. Derrell Hinton 01/14/2019  Antimicrobials:  Anti-infectives (From admission, onward)   Start     Dose/Rate Route Frequency Ordered Stop   01/14/19 1103  sodium chloride 0.9 % with cefoTEtan (CEFOTAN) ADS Med    Note to Pharmacy: Christie Hinton  : cabinet override      01/14/19 1103 01/14/19 1124   01/14/19 1045  cefoTEtan (CEFOTAN) 2 g in sodium chloride 0.9 % 100 mL IVPB     2 g 200 mL/hr over 30 Minutes Intravenous  Once 01/14/19 1044 01/14/19 1124       Objective: Vitals:   02/07/19 0456 02/07/19 1332 02/07/19 2202 02/08/19 0427  BP: 118/69 115/72 126/78 116/68  Pulse: 84 94 98 85  Resp: 17 18 18 18   Temp: 98.2 F (36.8 C) 97.8 F (36.6 C) 98.2 F (36.8 C) 98.2 F (36.8 C)  TempSrc: Oral Oral Oral Oral  SpO2: 97% 100% 100% 98%  Weight:      Height:        Intake/Output Summary (Last 24 hours) at 02/08/2019 1101 Last data filed at 02/08/2019 0600 Gross per 24 hour  Intake 1320.96 ml  Output 0 ml  Net 1320.96 ml  Filed Weights   01/12/19 0929 01/21/19 1215 02/03/19 0535  Weight: 67.6 kg 71.9 kg 68.8 kg    Examination: General exam: Appears calm and comfortable  Respiratory system: Clear to auscultation. Respiratory effort normal. Cardiovascular system: S1 & S2 heard, RRR. No JVD, murmurs,  rubs, gallops or clicks. No pedal edema. Gastrointestinal system: Abdomen is nondistended, soft and nontender. No organomegaly or masses felt. Normal bowel sounds heard. Central nervous system: Alert and oriented. No focal neurological deficits. Extremities: Symmetric 5 x 5 power. Skin: No rashes, lesions or ulcers Psychiatry: Judgement and insight appear normal. Mood & affect appropriate.    Data Reviewed: I have personally reviewed following labs and imaging studies  CBC: Recent Labs  Lab 02/03/19 0457 02/06/19 0436  WBC 5.2 6.0  NEUTROABS 2.5  --   HGB 8.9* 9.5*  HCT 29.9* 30.5*  MCV 84.2 82.9  PLT 242 161   Basic Metabolic Panel: Recent Labs  Lab 02/02/19 0357 02/03/19 0457 02/06/19 0436  NA 134* 135 135  K 4.3 4.4 4.2  CL 101 102 102  CO2 25 24 23   GLUCOSE 143* 133* 119*  BUN 27* 28* 23  CREATININE 0.58 0.59 0.67  CALCIUM 8.2* 8.1* 8.5*  MG  --  2.2 1.9  PHOS  --  3.5 3.6   GFR: Estimated Creatinine Clearance: 45.5 mL/min (by C-G formula based on SCr of 0.67 mg/dL). Liver Function Tests: Recent Labs  Lab 02/03/19 0457 02/06/19 0436  AST 19 22  ALT 23 27  ALKPHOS 64 71  BILITOT 0.1* 0.2*  PROT 5.4* 5.8*  ALBUMIN 2.6* 3.0*   No results for input(s): LIPASE, AMYLASE in the last 168 hours. No results for input(s): AMMONIA in the last 168 hours. Coagulation Profile: No results for input(s): INR, PROTIME in the last 168 hours. Cardiac Enzymes: No results for input(s): CKTOTAL, CKMB, CKMBINDEX, TROPONINI in the last 168 hours. BNP (last 3 results) No results for input(s): PROBNP in the last 8760 hours. HbA1C: No results for input(s): HGBA1C in the last 72 hours. CBG: Recent Labs  Lab 02/06/19 2301 02/07/19 0702 02/07/19 1335 02/07/19 2203 02/08/19 0611  GLUCAP 124* 115* 132* 132* 117*   Lipid Profile: No results for input(s): CHOL, HDL, LDLCALC, TRIG, CHOLHDL, LDLDIRECT in the last 72 hours. Thyroid Function Tests: No results for input(s):  TSH, T4TOTAL, FREET4, T3FREE, THYROIDAB in the last 72 hours. Anemia Panel: No results for input(s): VITAMINB12, FOLATE, FERRITIN, TIBC, IRON, RETICCTPCT in the last 72 hours. Sepsis Labs: No results for input(s): PROCALCITON, LATICACIDVEN in the last 168 hours.  No results found for this or any previous visit (from the past 240 hour(s)).     Radiology Studies: Ct Abdomen Pelvis W Contrast  Result Date: 02/06/2019 CLINICAL DATA:  Nausea and vomiting EXAM: CT ABDOMEN AND PELVIS WITH CONTRAST TECHNIQUE: Multidetector CT imaging of the abdomen and pelvis was performed using the standard protocol following bolus administration of intravenous contrast. CONTRAST:  17mL OMNIPAQUE IOHEXOL 300 MG/ML  SOLN COMPARISON:  CT dated January 12, 2019. FINDINGS: Lower chest: The lung bases are clear. The heart size is normal. Hepatobiliary: There is a stable hypoattenuating area in hepatic segment 6, likely representing a benign process such as a cyst or hemangioma. Normal gallbladder.There is stable extrahepatic biliary ductal dilatation. Pancreas: Normal contours without ductal dilatation. No peripancreatic fluid collection. Spleen: No splenic laceration or hematoma. Adrenals/Urinary Tract: --Adrenal glands: No adrenal hemorrhage. --Right kidney/ureter: No hydronephrosis or perinephric hematoma. --Left kidney/ureter: No hydronephrosis or perinephric hematoma. --  Urinary bladder: Unremarkable. Stomach/Bowel: --Stomach/Duodenum: There is a large hiatal hernia, similar to prior study. There is some mild gastric wall thickening of unknown clinical significance. --Small bowel: There are few dilated loops of small bowel measuring up to approximately 3.5-4 cm. There are air-fluid levels involving these small bowel loops. There appears to be a transition point in the left lower quadrant. --Colon: Rectosigmoid diverticulosis without acute inflammation. --Appendix: Normal. Vascular/Lymphatic: Atherosclerotic calcification is present  within the non-aneurysmal abdominal aorta, without hemodynamically significant stenosis. There is a retroaortic left renal vein, a normal variant. --No retroperitoneal lymphadenopathy. --No mesenteric lymphadenopathy. --No pelvic or inguinal lymphadenopathy. Reproductive: Unremarkable Other: No ascites or free air. The abdominal wall is normal. Musculoskeletal. Multilevel degenerative disc disease and facet arthrosis. No bony spinal canal stenosis. Patient is status post total hip arthroplasty on the left without evidence of a periprosthetic fracture involving visualized portions of the left femur. IMPRESSION: 1. Again noted are dilated loops of small bowel with a transition point in the left lower quadrant. Findings are consistent with a low to moderate grade small bowel obstruction. 2. Large hiatal hernia. 3. Apparent wall thickening of the stomach which is similar to prior study and is of unknown clinical significance. This may be secondary to overall underdistention. 4. Sigmoid diverticulosis without CT evidence of diverticulitis. Electronically Signed   By: Katherine Mantlehristopher  Green M.D.   On: 02/06/2019 20:21      Scheduled Meds: . calcium carbonate  1 tablet Oral TID WC  . docusate sodium  100 mg Oral BID  . feeding supplement  1 Container Oral BID BM  . feeding supplement (ENSURE ENLIVE)  237 mL Oral BID BM  . heparin  5,000 Units Subcutaneous Q8H  . insulin aspart  0-15 Units Subcutaneous Q8H  . latanoprost  1 drop Both Eyes QHS  . levothyroxine  50 mcg Oral Q0600  . loratadine  10 mg Oral Daily  . megestrol  800 mg Oral Daily  . metoCLOPramide  5 mg Oral TID AC  . multivitamin with minerals  1 tablet Oral Daily  . pantoprazole  40 mg Oral BID  . polyethylene glycol  17 g Oral Daily  . sodium chloride flush  10-40 mL Intracatheter Q12H   Continuous Infusions: . lactated ringers 10 mL/hr at 02/05/19 0600  . TPN ADULT (ION) 40 mL/hr at 02/07/19 1835  . TPN ADULT (ION)       LOS: 27 days     Time spent: 25 minutes   Noralee StainJennifer Lashara Urey, DO [facility] [email or web address] 02/08/2019, 11:01 AM

## 2019-02-08 NOTE — Progress Notes (Signed)
PHARMACY - ADULT TOTAL PARENTERAL NUTRITION CONSULT NOTE   Pharmacy Consult for TPN Indication: Post-operative ileus following surgery for SBO  HPI: 85 y/o F with SBO underwent laparotomy and lysis of adhesions on 01/14/19, now on TPN. Hx of DM (diet-controlled) noted.  Significant events:  6/14: continue TPN, start CL soon and per CCS note may start to wean TPN soon if tolerated 6/16: started clear liquids 6/17: TPN reduced to 1/2 rate per surgery 6/18: +ileus, attempting to insert NGT but pt refusing.   6/20: Increase TPN back to full dose per surgery 6/22: repeat clamping trial but later developed nausea 6/24: NG removed, starting CLD, bowel regimen 6/25: advance to FLD 6/27: BM, advance to soft diet, add Breeze TID; starting 48 hr calorie count 6/29: eating minimally; having stools; KUB persistent postop ileus 6/30: TPN reduced to 1/2 rate per surgery and Megace started. 7/2:   Still w/ intermittent N/V.  Attempted gastric emptying study but pt unable to tolerate soft diet.  CT, findings included low to moderate grade SBO, large hiatal hernia. 7/3:   No nausea, now on FL diet. Per surgery, plan to see if pt has adequate protein intake on FL diet over next few days, then make decision regarding TPN.   Patient Measurements: Height: 5' 6.5" (168.9 cm) Weight: 151 lb 11.2 oz (68.8 kg) IBW/kg (Calculated) : 60.45 TPN AdjBW (KG): 67.6 Body mass index is 24.12 kg/m.    Vitals:   02/07/19 2202 02/08/19 0427  BP: 126/78 116/68  Pulse: 98 85  Resp: 18 18  Temp: 98.2 F (36.8 C) 98.2 F (36.8 C)  SpO2: 100% 98%    Intake/Output Summary (Last 24 hours) at 02/08/2019 0723 Last data filed at 02/08/2019 0600 Gross per 24 hour  Intake 1757.64 ml  Output 100 ml  Net 1657.64 ml    Current Nutrition: FL diet, TPN @ 40 mL/hr IVF: LR @ 42ml/hr Central access: PICC  TPN start date: 6/10  Estimated Nutritional Needs per RD assessment: Kcal: 1750 - 2025 / day Protein: 80-95  grams/day Fluid: >/= 1.8 L/day  Insulin Requirements:  Received 4 units moderate SSI in past 24 hrs; 10 units insulin in TPN 0.96 L bag    Recent Labs    02/06/19 0436  NA 135  K 4.2  CL 102  CO2 23  GLUCOSE 119*  BUN 23  CREATININE 0.67  CALCIUM 8.5*  PHOS 3.6  MG 1.9  ALBUMIN 3.0*  ALKPHOS 71  AST 22  ALT 27  BILITOT 0.2*   Assessment:  Glucose (goal CBG<150)  Hx DM - diet controlled  CBGs over past 24 hrs: 117-132 on SSI + insulin in TPN  Electrolytes - WNL (7/2)  Renal - SCr and BUN WNL (7/2), CrCl ~ 45 mL/min  LFTs - all below ULN (7/2)  TGs - WNL (6/29)  Prealbumin - improving 14.1>>  12.3 > 16.5 (6/29)  Surgery plans to leave on FL diet for next few days then reassess before making decision regarding TPN   Plan:        Now: change megace 20 bid to 800 qday for cachexia indication  At 1800:  Continue custom TPN at 40 mL/hr (~1/2 of goal rate, to help stimulate appetite)  Continue insulin 10 units / 0.96 L bag Continue MVI w/minerals by PO route Continue moderate SSI q8h  IVF per MD TPN lab panels on Mondays & Thursdays Follow daily   Herby AbrahamMichelle T. Quantavius Humm, Pharm.D 02/08/2019 7:38 AM

## 2019-02-08 NOTE — Progress Notes (Signed)
25 Days Post-Op   Subjective/Chief Complaint: Had some nausea yesterday. No emesis Had some liquids and 1/2 protein shake she recalls.  Calorie count hasn't been done since 7/1 Had 5 BMs  Objective: Vital signs in last 24 hours: Temp:  [97.8 F (36.6 C)-98.2 F (36.8 C)] 98.2 F (36.8 C) (07/04 0427) Pulse Rate:  [85-98] 85 (07/04 0427) Resp:  [18] 18 (07/04 0427) BP: (115-126)/(68-78) 116/68 (07/04 0427) SpO2:  [98 %-100 %] 98 % (07/04 0427) Last BM Date: 02/07/19  Intake/Output from previous day: 07/03 0701 - 07/04 0700 In: 1757.6 [P.O.:600; I.V.:1157.6] Out: 100 [Urine:100] Intake/Output this shift: No intake/output data recorded.  Alert, sitting in chair, nontoxic cta Reg Soft, NT  Lab Results:  Recent Labs    02/06/19 0436  WBC 6.0  HGB 9.5*  HCT 30.5*  PLT 248   BMET Recent Labs    02/06/19 0436  NA 135  K 4.2  CL 102  CO2 23  GLUCOSE 119*  BUN 23  CREATININE 0.67  CALCIUM 8.5*   PT/INR No results for input(s): LABPROT, INR in the last 72 hours. ABG No results for input(s): PHART, HCO3 in the last 72 hours.  Invalid input(s): PCO2, PO2  Studies/Results: Ct Abdomen Pelvis W Contrast  Result Date: 02/06/2019 CLINICAL DATA:  Nausea and vomiting EXAM: CT ABDOMEN AND PELVIS WITH CONTRAST TECHNIQUE: Multidetector CT imaging of the abdomen and pelvis was performed using the standard protocol following bolus administration of intravenous contrast. CONTRAST:  100mL OMNIPAQUE IOHEXOL 300 MG/ML  SOLN COMPARISON:  CT dated January 12, 2019. FINDINGS: Lower chest: The lung bases are clear. The heart size is normal. Hepatobiliary: There is a stable hypoattenuating area in hepatic segment 6, likely representing a benign process such as a cyst or hemangioma. Normal gallbladder.There is stable extrahepatic biliary ductal dilatation. Pancreas: Normal contours without ductal dilatation. No peripancreatic fluid collection. Spleen: No splenic laceration or hematoma.  Adrenals/Urinary Tract: --Adrenal glands: No adrenal hemorrhage. --Right kidney/ureter: No hydronephrosis or perinephric hematoma. --Left kidney/ureter: No hydronephrosis or perinephric hematoma. --Urinary bladder: Unremarkable. Stomach/Bowel: --Stomach/Duodenum: There is a large hiatal hernia, similar to prior study. There is some mild gastric wall thickening of unknown clinical significance. --Small bowel: There are few dilated loops of small bowel measuring up to approximately 3.5-4 cm. There are air-fluid levels involving these small bowel loops. There appears to be a transition point in the left lower quadrant. --Colon: Rectosigmoid diverticulosis without acute inflammation. --Appendix: Normal. Vascular/Lymphatic: Atherosclerotic calcification is present within the non-aneurysmal abdominal aorta, without hemodynamically significant stenosis. There is a retroaortic left renal vein, a normal variant. --No retroperitoneal lymphadenopathy. --No mesenteric lymphadenopathy. --No pelvic or inguinal lymphadenopathy. Reproductive: Unremarkable Other: No ascites or free air. The abdominal wall is normal. Musculoskeletal. Multilevel degenerative disc disease and facet arthrosis. No bony spinal canal stenosis. Patient is status post total hip arthroplasty on the left without evidence of a periprosthetic fracture involving visualized portions of the left femur. IMPRESSION: 1. Again noted are dilated loops of small bowel with a transition point in the left lower quadrant. Findings are consistent with a low to moderate grade small bowel obstruction. 2. Large hiatal hernia. 3. Apparent wall thickening of the stomach which is similar to prior study and is of unknown clinical significance. This may be secondary to overall underdistention. 4. Sigmoid diverticulosis without CT evidence of diverticulitis. Electronically Signed   By: Katherine Mantlehristopher  Green M.D.   On: 02/06/2019 20:21    Anti-infectives: Anti-infectives (From  admission, onward)  Start     Dose/Rate Route Frequency Ordered Stop   01/14/19 1103  sodium chloride 0.9 % with cefoTEtan (CEFOTAN) ADS Med    Note to Pharmacy: [name]  : cabinet override      01/14/19 1103 01/14/19 1124   01/14/19 1045  cefoTEtan (CEFOTAN) 2 g in sodium chloride 0.9 % 100 mL IVPB     2 g 200 mL/hr over 30 Minutes Intravenous  Once 01/14/19 1044 01/14/19 1124      Assessment/Plan: Thyroid disease HTN HLD DM Protein Calorie Malnutrition- Pre-albumin16.46/29  SBO/ileus S/p Ex Lap with LOA for SBO due to Omental Adhesion,internal hernia - Dr. Rosendo Gros - 01/14/2019 - POD #24 - Mobilize for bowel function - Pulm toliet - Keep K>4 and Mg >2 for bowel function - start Full liquid, continue boost -dietician following as well,continue reducedTPN rate to try to stimulate more PO intake, megace started yesterday as well - continue calorie count -continue scheduled low dose reglan - She has a moderate hiatal hernia, in speaking with her she eats very small meals at home bc if she tries to eat more, she will have issues, I think her recent episodes of n/v are probably more due to her hiatal hernia than what is possibly going on in her LLQ (but that is probably contributing to issues). She does have some mild SB dilation on CT. However she is having flatus/BMs.  I predict she may need to stay on full liquid diet for awhile. Would like to see if she meets protein intake on FLD over the next few days and can make decision about TPN at that time.   Will d/w nursing team about cont calorie counts  FEN - TPN,  Full liquid diet Bowel Regimen (Miralax, Colace) VTE - Heparin, SCDs, Mobilize  ID - Cefotetan periop. None currently. Afebrile. Foley - None  Follow up- Dr. Rosendo Gros  POC - Son - Tyrone Schimke [phone]) -Patient's son and daughter plan to be with her when she returns home. Daughter during the day and son at night.   LOS: 27 days     [name] 02/08/2019

## 2019-02-09 LAB — GLUCOSE, CAPILLARY
Glucose-Capillary: 100 mg/dL — ABNORMAL HIGH (ref 70–99)
Glucose-Capillary: 127 mg/dL — ABNORMAL HIGH (ref 70–99)
Glucose-Capillary: 145 mg/dL — ABNORMAL HIGH (ref 70–99)

## 2019-02-09 MED ORDER — TRAVASOL 10 % IV SOLN
INTRAVENOUS | Status: DC
  Administered 2019-02-09: 18:00:00 via INTRAVENOUS

## 2019-02-09 NOTE — Progress Notes (Signed)
26 Days Post-Op   Subjective/Chief Complaint: Reports yesterday was a good day Didn't get sick on stomach Several BMs Ate about 75% of one of her meals  Objective: Vital signs in last 24 hours: Temp:  [98 F (36.7 C)-98.1 F (36.7 C)] 98 F (36.7 C) (07/05 16100608) Pulse Rate:  [85-93] 87 (07/05 0608) Resp:  [18] 18 (07/05 0608) BP: (107-118)/(63-74) 107/63 (07/05 0608) SpO2:  [98 %-100 %] 98 % (07/05 0608) Last BM Date: 02/07/19  Intake/Output from previous day: 07/04 0701 - 07/05 0700 In: 1850.8 [P.O.:840; I.V.:1010.8] Out: 0  Intake/Output this shift: No intake/output data recorded.  Alert, sitting in chair, nontoxic cta Reg Soft, NT  Lab Results:  No results for input(s): WBC, HGB, HCT, PLT in the last 72 hours. BMET No results for input(s): NA, K, CL, CO2, GLUCOSE, BUN, CREATININE, CALCIUM in the last 72 hours. PT/INR No results for input(s): LABPROT, INR in the last 72 hours. ABG No results for input(s): PHART, HCO3 in the last 72 hours.  Invalid input(s): PCO2, PO2  Studies/Results: No results found.  Anti-infectives: Anti-infectives (From admission, onward)   Start     Dose/Rate Route Frequency Ordered Stop   01/14/19 1103  sodium chloride 0.9 % with cefoTEtan (CEFOTAN) ADS Med    Note to Pharmacy: Vevelyn RoyalsHarvell, Gwendolyn  : cabinet override      01/14/19 1103 01/14/19 1124   01/14/19 1045  cefoTEtan (CEFOTAN) 2 g in sodium chloride 0.9 % 100 mL IVPB     2 g 200 mL/hr over 30 Minutes Intravenous  Once 01/14/19 1044 01/14/19 1124      Assessment/Plan: Thyroid disease HTN HLD DM Protein Calorie Malnutrition- Pre-albumin16.46/29  SBO/ileus - S/p Ex Lap with LOA for SBO due to Omental Adhesion,internal hernia - Dr. Derrell Lollingamirez - 01/14/2019 - POD #24 - Mobilize for bowel function - Pulm toliet - Keep K>4 and Mg >2 for bowel function - cont Full liquid, continue boost -dietician following as well,continue reducedTPN rate to try to stimulate more PO  intake, megace started yesterday as well - continue calorie count -continue scheduled low dose reglan - She has a moderate hiatal hernia, in speaking with her she eats very small meals at home bc if she tries to eat more, she will have issues, I think her recent episodes of n/v are probably more due to her hiatal hernia than what is possibly going on in her LLQ (but that is probably contributing to issues). She does have some mild SB dilation on CT. However she is having flatus/BMs.  I predict she may need to stay on full liquid diet for awhile. Would like to see if she meets protein intake on FLD over the next few days and can make decision about TPN at that time.   Will d/w nursing team about cont calorie counts  FEN - TPN,  Full liquid diet Bowel Regimen (Miralax, Colace) VTE - Heparin, SCDs, Mobilize  ID - Cefotetan periop. None currently. Afebrile. Foley - None  Follow up- Dr. Derrell Lollingamirez  POC - Son - Rockie Neighboursd Bly [phone]) -Patient's son and daughter plan to be with her when she returns home. Daughter during the day and son at night.  Dispo: I think her SBO/ileus has resolved. And her intermittent n/v/sick on stomach has more to do with her moderate hiatal hernia. Would cont FLD. Cont protein shakes. I think we can stop TPN after today if she cont to improve with her oral intake   LOS: 28 days  [name] 02/09/2019

## 2019-02-09 NOTE — Progress Notes (Signed)
PROGRESS NOTE    Christie Hinton  ZOX:[id number] DOB: Oct 17, 1927 DOA: 01/12/2019 PCP: Sharren BridgeSellers, Billie H, NP     Brief Narrative:  Christie Hinton is a 85 year old female who presented abdominal pain, nausea or vomiting. She does have significant past medical history for hypertension, dyslipidemia and GERD. She reported generalized abdominal pain after a meal, associated with vomiting. She presented to the hospital due to progressive abdominal pain. On her initial physical examination her temperature was 97.7, heart rate 76, blood pressure 166/82, oxygen saturation 99%. Her lungs are clear to auscultation bilaterally, heart S1-S2 present rhythmic, the abdomen was soft, generalized abdominal pain to palpation, hypoactive bowel sounds, no lower extremity edema. CT of the abdomen with a small bowel obstruction, dilated loops of small bowel in the left abdomen with mesenteric edema and a well-defined transition point.Findings concerning for closed-loop obstruction, possibly related to an internal hernia. Patient was admitted to the hospital working diagnosis of small bowel obstruction. Underwent exploratory laparotomy on 01/14/19, lysis of adhesions. Complicated with post operative ileus.   New events last 24 hours / Subjective: Doing well this morning.  About to start eating breakfast.  No new complaints today.  Assessment & Plan:   Principal Problem:   SBO (small bowel obstruction) s/p lysis of adhesions 01/14/2019 Active Problems:   Essential hypertension   Mixed hyperlipidemia   Small bowel obstructionsp exploratory laparotomy 01/14/19, with post operative ileus. -General surgery following, decrease TPN to try to stimulate appetite and p.o. intake.  Megace started.  Calorie count in process.   -Unable to tolerate gastric emptying study -CT abdomen pelvis completed yesterday which revealed dilated loops of small bowel, low to moderate grade small bowel obstruction, large hiatal hernia, wall  thickening of the stomach, sigmoid diverticulosis without diverticulitis -General surgery planning to keep patient on full liquid diet, hopefully stop TPN today  HTN -Blood pressure well controlled  Hypothyroidism -Continues Synthroid  Dyslipidemia -Statin therapy on discharge  T2DM -Continue sliding scale insulin   DVT prophylaxis: Subcutaneous heparin Code Status: Full code Family Communication: Son over the phone  Disposition Plan: Home health PT on discharge, once p.o. intake adequate and weaned off TPN.  Full liquid diet today, hopefully off TPN soon    Consultants:   General surgery   Procedures:   Status post ex lap with lysis of adhesion for small bowel obstruction, omental adhesion, internal hernia with Dr. Derrell Lollingamirez 01/14/2019  Antimicrobials:  Anti-infectives (From admission, onward)   Start     Dose/Rate Route Frequency Ordered Stop   01/14/19 1103  sodium chloride 0.9 % with cefoTEtan (CEFOTAN) ADS Med    Note to Pharmacy: Vevelyn RoyalsHarvell, Gwendolyn  : cabinet override      01/14/19 1103 01/14/19 1124   01/14/19 1045  cefoTEtan (CEFOTAN) 2 g in sodium chloride 0.9 % 100 mL IVPB     2 g 200 mL/hr over 30 Minutes Intravenous  Once 01/14/19 1044 01/14/19 1124       Objective: Vitals:   02/08/19 0427 02/08/19 1401 02/08/19 2053 02/09/19 0608  BP: 116/68 118/74 108/69 107/63  Pulse: 85 93 85 87  Resp: 18 18 18 18   Temp: 98.2 F (36.8 C) 98.1 F (36.7 C) 98.1 F (36.7 C) 98 F (36.7 C)  TempSrc: Oral Oral Oral Oral  SpO2: 98% 100% 98% 98%  Weight:      Height:        Intake/Output Summary (Last 24 hours) at 02/09/2019 1026 Last data filed at 02/09/2019 0906 Gross per 24  hour  Intake 2090.82 ml  Output 0 ml  Net 2090.82 ml   Filed Weights   01/12/19 0929 01/21/19 1215 02/03/19 0535  Weight: 67.6 kg 71.9 kg 68.8 kg    Examination: General exam: Appears calm and comfortable  Respiratory system: Clear to auscultation. Respiratory effort normal.  Cardiovascular system: S1 & S2 heard, RRR. No JVD, murmurs, rubs, gallops or clicks. No pedal edema. Gastrointestinal system: Abdomen is nondistended, soft and nontender. No organomegaly or masses felt. Normal bowel sounds heard. Central nervous system: Alert and oriented. No focal neurological deficits. Extremities: Symmetric 5 x 5 power. Skin: No rashes, lesions or ulcers Psychiatry: Judgement and insight appear normal. Mood & affect appropriate.    Data Reviewed: I have personally reviewed following labs and imaging studies  CBC: Recent Labs  Lab 02/03/19 0457 02/06/19 0436  WBC 5.2 6.0  NEUTROABS 2.5  --   HGB 8.9* 9.5*  HCT 29.9* 30.5*  MCV 84.2 82.9  PLT 242 248   Basic Metabolic Panel: Recent Labs  Lab 02/03/19 0457 02/06/19 0436  NA 135 135  K 4.4 4.2  CL 102 102  CO2 24 23  GLUCOSE 133* 119*  BUN 28* 23  CREATININE 0.59 0.67  CALCIUM 8.1* 8.5*  MG 2.2 1.9  PHOS 3.5 3.6   GFR: Estimated Creatinine Clearance: 45.5 mL/min (by C-G formula based on SCr of 0.67 mg/dL). Liver Function Tests: Recent Labs  Lab 02/03/19 0457 02/06/19 0436  AST 19 22  ALT 23 27  ALKPHOS 64 71  BILITOT 0.1* 0.2*  PROT 5.4* 5.8*  ALBUMIN 2.6* 3.0*   No results for input(s): LIPASE, AMYLASE in the last 168 hours. No results for input(s): AMMONIA in the last 168 hours. Coagulation Profile: No results for input(s): INR, PROTIME in the last 168 hours. Cardiac Enzymes: No results for input(s): CKTOTAL, CKMB, CKMBINDEX, TROPONINI in the last 168 hours. BNP (last 3 results) No results for input(s): PROBNP in the last 8760 hours. HbA1C: No results for input(s): HGBA1C in the last 72 hours. CBG: Recent Labs  Lab 02/07/19 2203 02/08/19 0611 02/08/19 1543 02/08/19 2213 02/09/19 0650  GLUCAP 132* 117* 133* 116* 100*   Lipid Profile: No results for input(s): CHOL, HDL, LDLCALC, TRIG, CHOLHDL, LDLDIRECT in the last 72 hours. Thyroid Function Tests: No results for input(s):  TSH, T4TOTAL, FREET4, T3FREE, THYROIDAB in the last 72 hours. Anemia Panel: No results for input(s): VITAMINB12, FOLATE, FERRITIN, TIBC, IRON, RETICCTPCT in the last 72 hours. Sepsis Labs: No results for input(s): PROCALCITON, LATICACIDVEN in the last 168 hours.  No results found for this or any previous visit (from the past 240 hour(s)).     Radiology Studies: No results found.    Scheduled Meds: . calcium carbonate  1 tablet Oral TID WC  . docusate sodium  100 mg Oral BID  . feeding supplement  1 Container Oral BID BM  . feeding supplement (ENSURE ENLIVE)  237 mL Oral BID BM  . heparin  5,000 Units Subcutaneous Q8H  . insulin aspart  0-15 Units Subcutaneous Q8H  . latanoprost  1 drop Both Eyes QHS  . levothyroxine  50 mcg Oral Q0600  . loratadine  10 mg Oral Daily  . megestrol  800 mg Oral Daily  . metoCLOPramide  5 mg Oral TID AC  . multivitamin with minerals  1 tablet Oral Daily  . pantoprazole  40 mg Oral BID  . polyethylene glycol  17 g Oral Daily  . sodium chloride flush  10-40 mL Intracatheter Q12H   Continuous Infusions: . lactated ringers 10 mL/hr at 02/09/19 0541  . TPN ADULT (ION) 40 mL/hr at 02/09/19 0541  . TPN ADULT (ION)       LOS: 28 days    Time spent: 20 minutes   Dessa Phi, DO [facility] [email or web address] 02/09/2019, 10:26 AM

## 2019-02-09 NOTE — Plan of Care (Signed)
Patient lying in bed this morning; states pain and nausea well controlled. Has been up and ambulating to bathroom overnight. Will sit up in chair later this morning. Will continue to monitor.

## 2019-02-09 NOTE — Progress Notes (Signed)
PHARMACY - ADULT TOTAL PARENTERAL NUTRITION CONSULT NOTE   Pharmacy Consult for TPN Indication: Post-operative ileus following surgery for SBO  HPI: 85 y/o F with SBO underwent laparotomy and lysis of adhesions on 01/14/19, now on TPN. Hx of DM (diet-controlled) noted.  Significant events:  6/14: continue TPN, start CL soon and per CCS note may start to wean TPN soon if tolerated 6/16: started clear liquids 6/17: TPN reduced to 1/2 rate per surgery 6/18: +ileus, attempting to insert NGT but pt refusing.   6/20: Increase TPN back to full dose per surgery 6/22: repeat clamping trial but later developed nausea 6/24: NG removed, starting CLD, bowel regimen 6/25: advance to FLD 6/27: BM, advance to soft diet, add Breeze TID; starting 48 hr calorie count 6/29: eating minimally; having stools; KUB persistent postop ileus 6/30: TPN reduced to 1/2 rate per surgery and Megace started. 7/2:   Still w/ intermittent N/V.  Attempted gastric emptying study but pt unable to tolerate soft diet.  CT, findings included low to moderate grade SBO, large hiatal hernia. 7/3:   No nausea, now on FL diet. Per surgery, plan to see if pt has adequate protein intake on FL diet over next few days, then make decision regarding TPN. 7/4:  Megace dosage increased to 800 mg/day for appetite stimulation   Patient Measurements: Height: 5' 6.5" (168.9 cm) Weight: 151 lb 11.2 oz (68.8 kg) IBW/kg (Calculated) : 60.45 TPN AdjBW (KG): 67.6 Body mass index is 24.12 kg/m.    Vitals:   02/08/19 2053 02/09/19 0608  BP: 108/69 107/63  Pulse: 85 87  Resp: 18 18  Temp: 98.1 F (36.7 C) 98 F (36.7 C)  SpO2: 98% 98%    Intake/Output Summary (Last 24 hours) at 02/09/2019 0738 Last data filed at 02/09/2019 0600 Gross per 24 hour  Intake 1850.82 ml  Output 0 ml  Net 1850.82 ml    Current Nutrition: FL diet, TPN @ 40 mL/hr IVF: LR @ 38ml/hr Central access: PICC  TPN start date: 6/10  Estimated Nutritional Needs per  RD assessment: Kcal: 1750 - 2025 / day Protein: 80-95 grams/day Fluid: >/= 1.8 L/day  Insulin Requirements:  Received 2 units moderate SSI in past 24 hrs; 10 units insulin in TPN 0.96 L bag   Recent Labs: No results for input(s): NA, K, CL, CO2, GLUCOSE, BUN, CREATININE, CALCIUM, PHOS, MG, ALBUMIN, ALKPHOS, AST, ALT, BILITOT, TRIG, PREALBUMIN in the last 72 hours.   Assessment:  Glucose (goal CBG 100-150)  Hx DM - diet controlled  CBGs over past 24 hrs: 133 > 116 > 100 on SSI + insulin in TPN  Electrolytes - WNL (7/2)  Renal - SCr and BUN WNL (7/2), CrCl ~ 45 mL/min  LFTs - all below ULN (7/2)  TGs - WNL (6/29)  Prealbumin - improving 14.1>>  12.3 > 16.5 (6/29)  Surgery plans to leave on FL diet for next few days then reassess before making decision regarding TPN   Plan:        At 1800:  Continue custom TPN at 40 mL/hr (~1/2 of goal rate, to help stimulate appetite)  Remove insulin from TPN bag given small amount currently ordered (10 units) and CBG trend Continue moderate SSI q8h  Continue MVI w/minerals by PO route IVF per MD TPN lab panels on Mondays & Thursdays - due tomorrow Follow daily   Clayburn Pert, PharmD, BCPS [phone] 02/09/2019  7:38 AM   ADDENDUM: Was notified that due to national shortage the [facility] compounding pharmacy is out of 30% lipid emulsion today.  Will therefore omit from formula today and re-evaluate tomorrow.

## 2019-02-10 LAB — DIFFERENTIAL
Abs Immature Granulocytes: 0.04 10*3/uL (ref 0.00–0.07)
Basophils Absolute: 0 10*3/uL (ref 0.0–0.1)
Basophils Relative: 1 %
Eosinophils Absolute: 0.2 10*3/uL (ref 0.0–0.5)
Eosinophils Relative: 3 %
Immature Granulocytes: 1 %
Lymphocytes Relative: 42 %
Lymphs Abs: 2.6 10*3/uL (ref 0.7–4.0)
Monocytes Absolute: 0.6 10*3/uL (ref 0.1–1.0)
Monocytes Relative: 10 %
Neutro Abs: 2.7 10*3/uL (ref 1.7–7.7)
Neutrophils Relative %: 43 %

## 2019-02-10 LAB — COMPREHENSIVE METABOLIC PANEL
ALT: 21 U/L (ref 0–44)
AST: 17 U/L (ref 15–41)
Albumin: 2.9 g/dL — ABNORMAL LOW (ref 3.5–5.0)
Alkaline Phosphatase: 69 U/L (ref 38–126)
Anion gap: 5 (ref 5–15)
BUN: 21 mg/dL (ref 8–23)
CO2: 26 mmol/L (ref 22–32)
Calcium: 8.3 mg/dL — ABNORMAL LOW (ref 8.9–10.3)
Chloride: 102 mmol/L (ref 98–111)
Creatinine, Ser: 0.69 mg/dL (ref 0.44–1.00)
GFR calc Af Amer: >60 mL/min (ref >60)
GFR calc non Af Amer: >60 mL/min (ref >60)
Glucose, Bld: 121 mg/dL — ABNORMAL HIGH (ref 70–99)
Potassium: 4.3 mmol/L (ref 3.5–5.1)
Sodium: 133 mmol/L — ABNORMAL LOW (ref 135–145)
Total Bilirubin: 0.2 mg/dL — ABNORMAL LOW (ref 0.3–1.2)
Total Protein: 5.7 g/dL — ABNORMAL LOW (ref 6.5–8.1)

## 2019-02-10 LAB — GLUCOSE, CAPILLARY: Glucose-Capillary: 129 mg/dL — ABNORMAL HIGH (ref 70–99)

## 2019-02-10 LAB — CBC
HCT: 28.9 % — ABNORMAL LOW (ref 36.0–46.0)
Hemoglobin: 8.9 g/dL — ABNORMAL LOW (ref 12.0–15.0)
MCH: 25.4 pg — ABNORMAL LOW (ref 26.0–34.0)
MCHC: 30.8 g/dL (ref 30.0–36.0)
MCV: 82.3 fL (ref 80.0–100.0)
Platelets: 242 10*3/uL (ref 150–400)
RBC: 3.51 MIL/uL — ABNORMAL LOW (ref 3.87–5.11)
RDW: 16.7 % — ABNORMAL HIGH (ref 11.5–15.5)
WBC: 6.1 10*3/uL (ref 4.0–10.5)
nRBC: 0 % (ref 0.0–0.2)

## 2019-02-10 LAB — PHOSPHORUS: Phosphorus: 3.2 mg/dL (ref 2.5–4.6)

## 2019-02-10 LAB — MAGNESIUM: Magnesium: 2 mg/dL (ref 1.7–2.4)

## 2019-02-10 LAB — PREALBUMIN: Prealbumin: 15.8 mg/dL — ABNORMAL LOW (ref 18–38)

## 2019-02-10 LAB — TRIGLYCERIDES: Triglycerides: 69 mg/dL (ref <150)

## 2019-02-10 MED ORDER — TRAVASOL 10 % IV SOLN: INTRAVENOUS | Status: AC

## 2019-02-10 MED ORDER — SODIUM CHLORIDE 0.9% FLUSH
3.0000 mL | Freq: Two times a day (BID) | INTRAVENOUS | Status: DC
  Administered 2019-02-10: 10 mL via INTRAVENOUS

## 2019-02-10 MED ORDER — SODIUM CHLORIDE 0.9 % IV SOLN: 250.0000 mL | INTRAVENOUS | Status: DC | PRN

## 2019-02-10 MED ORDER — SODIUM CHLORIDE 0.9% FLUSH: 3.0000 mL | INTRAVENOUS | Status: DC | PRN

## 2019-02-10 NOTE — Progress Notes (Signed)
Nutrition Follow-up  DOCUMENTATION CODES:   Not applicable  INTERVENTION:  - continue Boost Breeze BID and Ensure Enlive BID. - will start Calorie Count (7/6-7/8). - continue to encourage PO intakes and advance diet as medically feasible. - will provide diet education prior to d/c.  - weigh patient today.   NUTRITION DIAGNOSIS:   Inadequate oral intake related to acute illness, decreased appetite as evidenced by meal completion < 50%, per patient/family report. -ongoing  GOAL:   Patient will meet greater than or equal to 90% of their needs -unmet without TPN  MONITOR:   PO intake, Supplement acceptance, Labs, Weight trends, Other (Comment)(TPN regimen)  REASON FOR ASSESSMENT:   Consult Assessment of nutrition requirement/status  ASSESSMENT:   85 year old female with history of HTN, hyperlipidemia, and GERD who presented initially to [facility] with complaints of generalized abdominal pain and vomiting that started after dinner. Patient reported lower abdominal pain which progressively got worse and developed N/V after that. Last bowel movement was 2 days before admission. CT abdomen showed SBO with dilated loops of small bowel. Findings concerning for closed-loop obstruction. General surgery consulted.  Patient has not been weighed since 6/29. Diet downgraded from Soft to NPO on 7/2 at 7:45 AM and then advanced to FLD on 7/3 at 8:00 AM. Meal completion recently: 6/30-  25% dinner (187 kcal, 9 grams protein) 7/1- 0% breakfast, 25% lunch (182 kcal, 7 grams protein) 7/4- 50% dinner (217 kcal, 6 grams protein) 7/5- 50% breakfast and 30% lunch (total of 376 kcal, 10 grams protein)  TPN stopping today. Plan is for patient to remain on FLD for the for seeable future. Boost Breeze (250 kcal, 9 grams protein) and Ensure Enlive (350 kcal, 20 grams protein) both ordered BID and patient is accepting both of these supplements ~50% of the time offered.      Medications  reviewed; 100 mg colace BID, 50 mcg oral synthroid/day, 5 mg reglan TID, 40 mg protonix BID, 1 packet miralax/day, 800 mg megace/day (started 7/4). Labs reviewed; CBG: 129 mg/dl today, Na: 133 mmol/l, Ca: 8.3 mg/dl.    Diet Order:   Diet Order            Diet full liquid Room service appropriate? Yes; Fluid consistency: Thin  Diet effective now              EDUCATION NEEDS:   No education needs have been identified at this time  Skin:  Skin Assessment: Skin Integrity Issues: Skin Integrity Issues:: Incisions Incisions: abdomen (6/9)  Last BM:  7/3  Height:   Ht Readings from Last 1 Encounters:  01/12/19 5' 6.5" (1.689 m)    Weight:   Wt Readings from Last 1 Encounters:  02/03/19 68.8 kg    Ideal Body Weight:  60.2 kg  BMI:  Body mass index is 24.12 kg/m.  Estimated Nutritional Needs:   Kcal:  1750-2025 kcal  Protein:  80-95 grams  Fluid:  >/= 1.8 L/day     Jarome Matin, MS, RD, LDN, [facility] Inpatient Clinical Dietitian Pager # [phone] After hours/weekend pager # [phone]

## 2019-02-10 NOTE — Progress Notes (Signed)
27 Days Post-Op    CC: Abdominal pain  Subjective: Patient is up walking to the bathroom using a pole for assistance.  She is tolerating full liquids fairly well.  Her midline incision is healing nicely.  Overall she seems to be making fairly good progress.  Objective: Vital signs in last 24 hours: Temp:  [98.2 F (36.8 C)-98.7 F (37.1 C)] 98.2 F (36.8 C) (07/06 0528) Pulse Rate:  [75-95] 88 (07/06 0528) Resp:  [18-20] 20 (07/06 0528) BP: (97-127)/(66-84) 97/84 (07/06 0528) SpO2:  [96 %-99 %] 96 % (07/06 0528) Last BM Date: 02/07/19 460 PO 1036 IV Urine x 8 Stool x 1 Afebrile, VSS Labs OK Prealbumin 15.8  Intake/Output from previous day: 07/05 0701 - 07/06 0700 In: 1496.1 [P.O.:460; I.V.:1036.1] Out: -  Intake/Output this shift: No intake/output data recorded.  General appearance: alert, cooperative and no distress Resp: clear to auscultation bilaterally GI: soft, non-tender; bowel sounds normal; no masses,  no organomegaly and Tolerating full liquids well.  Lab Results:  Recent Labs    02/10/19 0427  WBC 6.1  HGB 8.9*  HCT 28.9*  PLT 242    BMET Recent Labs    02/10/19 0427  NA 133*  K 4.3  CL 102  CO2 26  GLUCOSE 121*  BUN 21  CREATININE 0.69  CALCIUM 8.3*   PT/INR No results for input(s): LABPROT, INR in the last 72 hours.  Recent Labs  Lab 02/06/19 0436 02/10/19 0427  AST 22 17  ALT 27 21  ALKPHOS 71 69  BILITOT 0.2* 0.2*  PROT 5.8* 5.7*  ALBUMIN 3.0* 2.9*     Lipase     Component Value Date/Time   LIPASE 29 01/12/2019 0928     Medications: . calcium carbonate  1 tablet Oral TID WC  . docusate sodium  100 mg Oral BID  . feeding supplement  1 Container Oral BID BM  . feeding supplement (ENSURE ENLIVE)  237 mL Oral BID BM  . heparin  5,000 Units Subcutaneous Q8H  . insulin aspart  0-15 Units Subcutaneous Q8H  . latanoprost  1 drop Both Eyes QHS  . levothyroxine  50 mcg Oral Q0600  . loratadine  10 mg Oral Daily  .  megestrol  800 mg Oral Daily  . metoCLOPramide  5 mg Oral TID AC  . multivitamin with minerals  1 tablet Oral Daily  . pantoprazole  40 mg Oral BID  . polyethylene glycol  17 g Oral Daily  . sodium chloride flush  10-40 mL Intracatheter Q12H   . lactated ringers 10 mL/hr at 02/09/19 1400  . TPN ADULT (ION) 40 mL/hr at 02/09/19 1813    Assessment/Plan Thyroid disease HTN HLD DM Hiatal hernia - TUMS, Protonix Protein Calorie Malnutrition- Pre-albumin16.4(6/29) >> 15.8(7/6) Anemia H/H 8.9/28.9(7/6)  SBO/ileus - S/p Ex Lap with LOA for SBO due to Omental Adhesion,internal hernia - Dr. Rosendo Gros - 01/14/2019 - POD #27 - Mobilize for bowel function - Pulm toliet - Keep K>4 and Mg >2 for bowel function - cont Full liquid, continue boost -dietician following as well,continue reducedTPN rate to try to stimulate more PO intake, megace started yesterday as well - continue calorie count -continue scheduled low dose reglan - She has a moderate hiatal hernia, in speaking with her she eats very small meals at home bc if she tries to eat more, she will have issues, I think her recent episodes of n/v are probably more due to her hiatal hernia than what is possibly  going on in her LLQ (but that is probably contributing to issues). She does have some mild SB dilation on CT. However she is having flatus/BMs.  I predict she may need to stay on full liquid diet for awhile. Would like to see if she meets protein intake on FLD over the next few days and can make decision about TPN at that time.   Will d/w nursing team about cont calorie counts  FEN - TPN,  Full liquid diet Bowel Regimen (Miralax, Colace) VTE - Heparin, SCDs, Mobilize  ID - Cefotetan periop. None currently. Afebrile. Foley - None  Follow up- Dr. Derrell Lollingamirez  POC - Son - Rockie Neighboursd Bly [phone]) -Patient's son and daughter plan to be with her when she returns home. Daughter during the day and son at night.  Plan: We  will get the dietitian to help her with full liquid diet.  I am going to stop the TPN.  We will let PT and OT work with her's further she can get in the shower with assistance.  If she continues to do well on full liquids and is meeting her dietary goals we can begin planning her discharge home.       LOS: 29 days    Leldon Steege 02/10/2019 [phone]

## 2019-02-10 NOTE — Progress Notes (Signed)
PHARMACY - ADULT TOTAL PARENTERAL NUTRITION CONSULT NOTE   Pharmacy Consult for TPN Indication: Post-operative ileus following surgery for SBO  HPI: 85 y/o Hinton with SBO underwent laparotomy and lysis of adhesions on 01/14/19, now on TPN. Hx of DM (diet-controlled) noted.  Significant events:  6/14: continue TPN, start CL soon and per CCS note may start to wean TPN soon if tolerated 6/16: started clear liquids 6/17: TPN reduced to 1/2 rate per surgery 6/18: +ileus, attempting to insert NGT but pt refusing.   6/20: Increase TPN back to full dose per surgery 6/22: repeat clamping trial but later developed nausea 6/24: NG removed, starting CLD, bowel regimen 6/25: advance to FLD 6/27: BM, advance to soft diet, add Breeze TID; starting 48 hr calorie count 6/29: eating minimally; having stools; KUB persistent postop ileus 6/30: TPN reduced to 1/2 rate per surgery and Megace started. 7/2:   Still w/ intermittent N/V.  Attempted gastric emptying study but pt unable to tolerate soft diet.  CT, findings included low to moderate grade SBO, large hiatal hernia. 7/3:   No nausea, now on FL diet. Per surgery, plan to see if pt has adequate protein intake on FL diet over next few days, then make decision regarding TPN. 7/4:  Megace dosage increased to 800 mg/day for appetite stimulation   Patient Measurements: Height: 5' 6.5" (168.9 cm) Weight: 151 lb 11.2 oz (68.8 kg) IBW/kg (Calculated) : 60.45 TPN AdjBW (KG): 67.6 Body mass index is 24.12 kg/m.    Vitals:   02/09/19 2058 02/10/19 0528  BP: 118/73 97/84  Pulse: 95 88  Resp: 20 20  Temp: 98.6 [place] (37 C) 98.2 [place] (36.8 C)  SpO2: 98% 96%    Intake/Output Summary (Last 24 hours) at 02/10/2019 7322 Last data filed at 02/10/2019 0600 Gross per 24 hour  Intake 1496.06 ml  Output -  Net 1496.06 ml    Current Nutrition: FL diet, TPN @ 40 mL/hr IVF: LR @ 40ml/hr Central access: PICC  TPN start date: 6/10  Estimated Nutritional Needs per RD  assessment: Kcal: 1750 - 2025 / day Protein: 80-95 grams/day Fluid: >/= 1.8 L/day  Insulin Requirements:  Received 6 units moderate SSI in past 24 hrs.  Insulin was removed from TPN bag on 7/5.  Recent Labs: Recent Labs    02/10/19 0427  NA 133*  K 4.3  CL 102  CO2 26  GLUCOSE 121*  BUN 21  CREATININE 0.69  CALCIUM 8.3*  PHOS 3.2  MG 2.0  ALBUMIN 2.9*  ALKPHOS 69  AST 17  ALT 21  BILITOT 0.2*  TRIG 69  PREALBUMIN 15.8*     Assessment:  Glucose (goal CBG 100-150)  Hx DM - diet controlled  CBGs over past 24 hrs: 127-145  Electrolytes - WNL (Ca slightly low but CorrCa WNL)  Renal - SCr and BUN WNL, CrCl ~ 45 mL/min  LFTs - all below ULN   TGs - WNL   Prealbumin - 14.1>>  12.3 > 16.5;  15.8  today  Plan:        Agree with discontinuing TPN today as ordered by surgery. Reduce TPN rate to 20 mL/hr x 2 hrs then stop. DC TPN labs, CBGs, sliding scale insulin coverage.    Clayburn Pert, PharmD, BCPS [phone] 02/10/2019  8:17 AM

## 2019-02-10 NOTE — Progress Notes (Signed)
PROGRESS NOTE    Christie Hinton  LKG:[id number] DOB: 11-03-1927 DOA: 01/12/2019 PCP: Sharren BridgeSellers, Billie H, NP     Brief Narrative:  Christie Hinton is a 85 year old female who presented abdominal pain, nausea or vomiting. She does have significant past medical history for hypertension, dyslipidemia and GERD. She reported generalized abdominal pain after a meal, associated with vomiting. She presented to the hospital due to progressive abdominal pain. On her initial physical examination her temperature was 97.7, heart rate 76, blood pressure 166/82, oxygen saturation 99%. Her lungs are clear to auscultation bilaterally, heart S1-S2 present rhythmic, the abdomen was soft, generalized abdominal pain to palpation, hypoactive bowel sounds, no lower extremity edema. CT of the abdomen with a small bowel obstruction, dilated loops of small bowel in the left abdomen with mesenteric edema and a well-defined transition point.Findings concerning for closed-loop obstruction, possibly related to an internal hernia. Patient was admitted to the hospital working diagnosis of small bowel obstruction. Underwent exploratory laparotomy on 01/14/19, lysis of adhesions. Complicated with post operative ileus.   New events last 24 hours / Subjective: Has tolerated full liquid diet. No further nausea.   Assessment & Plan:   Principal Problem:   SBO (small bowel obstruction) s/p lysis of adhesions 01/14/2019 Active Problems:   Essential hypertension   Mixed hyperlipidemia   Small bowel obstructionsp exploratory laparotomy 01/14/19, with post operative ileus. -General surgery following, decrease TPN to try to stimulate appetite and p.o. intake.  Megace started.  Calorie count in process.   -Unable to tolerate gastric emptying study -CT abdomen pelvis completed yesterday which revealed dilated loops of small bowel, low to moderate grade small bowel obstruction, large hiatal hernia, wall thickening of the stomach, sigmoid  diverticulosis without diverticulitis -Continue full liquid diet -Gen surg planning to stop TPN today   HTN -Blood pressure well controlled  Hypothyroidism -Continues Synthroid  Dyslipidemia -Statin therapy on discharge  T2DM -Continue sliding scale insulin   DVT prophylaxis: Subcutaneous heparin Code Status: Full code Family Communication: None  Disposition Plan: Home health PT on discharge. Full liquid diet. Planning to stop TPN today.    Consultants:   General surgery   Procedures:   Status post ex lap with lysis of adhesion for small bowel obstruction, omental adhesion, internal hernia with Dr. Derrell Lollingamirez 01/14/2019  Antimicrobials:  Anti-infectives (From admission, onward)   Start     Dose/Rate Route Frequency Ordered Stop   01/14/19 1103  sodium chloride 0.9 % with cefoTEtan (CEFOTAN) ADS Med    Note to Pharmacy: Vevelyn RoyalsHarvell, Gwendolyn  : cabinet override      01/14/19 1103 01/14/19 1124   01/14/19 1045  cefoTEtan (CEFOTAN) 2 g in sodium chloride 0.9 % 100 mL IVPB     2 g 200 mL/hr over 30 Minutes Intravenous  Once 01/14/19 1044 01/14/19 1124       Objective: Vitals:   02/09/19 0608 02/09/19 1430 02/09/19 2058 02/10/19 0528  BP: 107/63 127/66 118/73 97/84  Pulse: 87 75 95 88  Resp: 18 18 20 20   Temp: 98 F (36.7 C) 98.7 F (37.1 C) 98.6 F (37 C) 98.2 F (36.8 C)  TempSrc: Oral Oral Oral Oral  SpO2: 98% 99% 98% 96%  Weight:      Height:        Intake/Output Summary (Last 24 hours) at 02/10/2019 0919 Last data filed at 02/10/2019 0600 Gross per 24 hour  Intake 1256.06 ml  Output -  Net 1256.06 ml   Filed Weights   01/12/19 0929  01/21/19 1215 02/03/19 0535  Weight: 67.6 kg 71.9 kg 68.8 kg    Examination: General exam: Appears calm and comfortable  Respiratory system: Clear to auscultation. Respiratory effort normal. Cardiovascular system: S1 & S2 heard, RRR. +murmur  Gastrointestinal system: Abdomen is nondistended, soft and nontender. No  organomegaly or masses felt. Normal bowel sounds heard. Central nervous system: Alert and oriented. No focal neurological deficits. Extremities: Symmetric 5 x 5 power. Skin: No rashes, lesions or ulcers Psychiatry: Judgement and insight appear normal. Mood & affect appropriate.   Data Reviewed: I have personally reviewed following labs and imaging studies  CBC: Recent Labs  Lab 02/06/19 0436 02/10/19 0427  WBC 6.0 6.1  NEUTROABS  --  2.7  HGB 9.5* 8.9*  HCT 30.5* 28.9*  MCV 82.9 82.3  PLT 248 242   Basic Metabolic Panel: Recent Labs  Lab 02/06/19 0436 02/10/19 0427  NA 135 133*  K 4.2 4.3  CL 102 102  CO2 23 26  GLUCOSE 119* 121*  BUN 23 21  CREATININE 0.67 0.69  CALCIUM 8.5* 8.3*  MG 1.9 2.0  PHOS 3.6 3.2   GFR: Estimated Creatinine Clearance: 45.5 mL/min (by C-G formula based on SCr of 0.69 mg/dL). Liver Function Tests: Recent Labs  Lab 02/06/19 0436 02/10/19 0427  AST 22 17  ALT 27 21  ALKPHOS 71 69  BILITOT 0.2* 0.2*  PROT 5.8* 5.7*  ALBUMIN 3.0* 2.9*   No results for input(s): LIPASE, AMYLASE in the last 168 hours. No results for input(s): AMMONIA in the last 168 hours. Coagulation Profile: No results for input(s): INR, PROTIME in the last 168 hours. Cardiac Enzymes: No results for input(s): CKTOTAL, CKMB, CKMBINDEX, TROPONINI in the last 168 hours. BNP (last 3 results) No results for input(s): PROBNP in the last 8760 hours. HbA1C: No results for input(s): HGBA1C in the last 72 hours. CBG: Recent Labs  Lab 02/08/19 2213 02/09/19 0650 02/09/19 1428 02/09/19 2100 02/10/19 0529  GLUCAP 116* 100* 127* 145* 129*   Lipid Profile: Recent Labs    02/10/19 0427  TRIG 69   Thyroid Function Tests: No results for input(s): TSH, T4TOTAL, FREET4, T3FREE, THYROIDAB in the last 72 hours. Anemia Panel: No results for input(s): VITAMINB12, FOLATE, FERRITIN, TIBC, IRON, RETICCTPCT in the last 72 hours. Sepsis Labs: No results for input(s):  PROCALCITON, LATICACIDVEN in the last 168 hours.  No results found for this or any previous visit (from the past 240 hour(s)).     Radiology Studies: No results found.    Scheduled Meds: . calcium carbonate  1 tablet Oral TID WC  . docusate sodium  100 mg Oral BID  . feeding supplement  1 Container Oral BID BM  . feeding supplement (ENSURE ENLIVE)  237 mL Oral BID BM  . heparin  5,000 Units Subcutaneous Q8H  . latanoprost  1 drop Both Eyes QHS  . levothyroxine  50 mcg Oral Q0600  . loratadine  10 mg Oral Daily  . megestrol  800 mg Oral Daily  . metoCLOPramide  5 mg Oral TID AC  . multivitamin with minerals  1 tablet Oral Daily  . pantoprazole  40 mg Oral BID  . polyethylene glycol  17 g Oral Daily  . sodium chloride flush  10-40 mL Intracatheter Q12H   Continuous Infusions: . lactated ringers 10 mL/hr at 02/09/19 1400  . TPN ADULT (ION)       LOS: 29 days    Time spent: 20 minutes   Noralee StainJennifer Jahmia Berrett, DO  [facility] [email or web address] 02/10/2019, 9:19 AM

## 2019-02-10 NOTE — Progress Notes (Signed)
Physical Therapy Treatment Patient Details Name: Anastasio ChampionMarian Inghram MRN: [id number] DOB: 09-15-1927 Today's Date: 02/10/2019    History of Present Illness 85 yo female admitted with SBO. S/P lysis of adhesions 01/14/19. Hx of DM    PT Comments    Pt continues to participate well.    Follow Up Recommendations  Home health PT;Supervision - Intermittent     Equipment Recommendations  None recommended by PT    Recommendations for Other Services       Precautions / Restrictions Precautions Precautions: Fall Restrictions Weight Bearing Restrictions: No    Mobility  Bed Mobility               General bed mobility comments: OOB in recliner  Transfers Overall transfer level: Needs assistance   Transfers: Sit to/from Stand Sit to Stand: Supervision         General transfer comment: for safety.  Ambulation/Gait Ambulation/Gait assistance: Min guard Gait Distance (Feet): 500 Feet Assistive device: IV Pole Gait Pattern/deviations: Step-through pattern;Decreased stride length     General Gait Details: tolerated distance well   Stairs             Wheelchair Mobility    Modified Rankin (Stroke Patients Only)       Balance Overall balance assessment: Mild deficits observed, not formally tested                                          Cognition Arousal/Alertness: Awake/alert Behavior During Therapy: WFL for tasks assessed/performed Overall Cognitive Status: Within Functional Limits for tasks assessed                                        Exercises      General Comments        Pertinent Vitals/Pain Pain Assessment: No/denies pain    Home Living                      Prior Function            PT Goals (current goals can now be found in the care plan section) Progress towards PT goals: Progressing toward goals    Frequency    Min 3X/week      PT Plan Current plan remains appropriate     Co-evaluation              AM-PAC PT "6 Clicks" Mobility   Outcome Measure  Help needed turning from your back to your side while in a flat bed without using bedrails?: A Little Help needed moving from lying on your back to sitting on the side of a flat bed without using bedrails?: A Little Help needed moving to and from a bed to a chair (including a wheelchair)?: A Little Help needed standing up from a chair using your arms (e.g., wheelchair or bedside chair)?: A Little Help needed to walk in hospital room?: A Little Help needed climbing 3-5 steps with a railing? : A Little 6 Click Score: 18    End of Session   Activity Tolerance: Patient tolerated treatment well Patient left: in chair;with call bell/phone within reach   PT Visit Diagnosis: Unsteadiness on feet (R26.81);Muscle weakness (generalized) (M62.81)     Time: [id number] PT Time Calculation (min) (ACUTE ONLY): 11 min  Charges:  $Gait Training: 8-22 mins                        Weston Anna, [place] Pager: [phone] Office: [phone]

## 2019-02-11 MED ORDER — POLYETHYLENE GLYCOL 3350 17 G PO PACK: 17.0000 g | PACK | Freq: Every day | ORAL | 0 refills | Status: AC

## 2019-02-11 MED ORDER — METOCLOPRAMIDE HCL 5 MG PO TABS: 5.0000 mg | ORAL_TABLET | Freq: Three times a day (TID) | ORAL | 0 refills | Status: AC

## 2019-02-11 MED ORDER — MEGESTROL ACETATE 400 MG/10ML PO SUSP: 800.0000 mg | Freq: Every day | ORAL | 0 refills | Status: AC

## 2019-02-11 MED ORDER — OMEPRAZOLE 40 MG PO CPDR: 40.0000 mg | DELAYED_RELEASE_CAPSULE | Freq: Two times a day (BID) | ORAL | 0 refills | Status: AC

## 2019-02-11 MED ORDER — DOCUSATE SODIUM 100 MG PO CAPS: 100.0000 mg | ORAL_CAPSULE | Freq: Two times a day (BID) | ORAL | 0 refills | Status: AC

## 2019-02-11 NOTE — TOC Initial Note (Signed)
Transition of Care [facility]) - Initial/Assessment Note    Patient Details  Name: Christie Hinton MRN: [id number] Date of Birth: 1928-06-09  Transition of Care (TOC) CM/SW Contact:    Joaquin Courts, RN Phone Number: 02/11/2019, 10:02 AM  Clinical Narrative:      CM spoke with [facility] rep Ronalee Belts to confirm patient is set up for [facility] PT RN and aide.               Expected Discharge Plan: [place] Barriers to Discharge: No Barriers Identified   Patient Goals and CMS Choice Patient states their goals for this hospitalization and ongoing recovery are:: to go home CMS Medicare.gov Compare Post Acute Care list provided to:: Patient Choice offered to / list presented to : Patient  Expected Discharge Plan and Services Expected Discharge Plan: [place] In-house Referral: Clinical Social Work Discharge Planning Services: CM Consult Post Acute Care Choice: [place] arrangements for the past 2 months: Single Family Home Expected Discharge Date: 02/11/19               DME Arranged: N/A DME Agency: NA       HH Arranged: PT, Nurse's Aide, RN [place] Agency: [facility] [employer] [facility] [employer] Date [place]: 02/11/19 Time [place]: 22 Representative spoke with at [place]: [place] Arrangements/Services Living arrangements for the past 2 months: [place] with:: Self Patient language and need for interpreter reviewed:: Yes Do you feel safe going back to the place where you live?: Yes      Need for Family Participation in Patient Care: Yes (Comment) Care giver support system in place?: Yes (comment) Current home services: DME Criminal Activity/Legal Involvement Pertinent to Current Situation/Hospitalization: No - Comment as needed  Activities of Daily Living Home Assistive Devices/Equipment: Cane (specify quad or straight) ADL Screening (condition at time of admission) Patient's  cognitive ability adequate to safely complete daily activities?: Yes Is the patient deaf or have difficulty hearing?: Yes Does the patient have difficulty seeing, even when wearing glasses/contacts?: No Does the patient have difficulty concentrating, remembering, or making decisions?: No Patient able to express need for assistance with ADLs?: Yes Does the patient have difficulty dressing or bathing?: No Independently performs ADLs?: Yes (appropriate for developmental age) Does the patient have difficulty walking or climbing stairs?: No Weakness of Legs: Both Weakness of Arms/Hands: Both  Permission Sought/Granted Permission sought to share information with : Facility Art therapist granted to share information with : Yes, Verbal Permission Granted     Permission granted to share info w AGENCY: [facility]- to find out what [facility] agency pt was referred to by [facility] last year        Emotional Assessment Appearance:: Appears stated age Attitude/Demeanor/Rapport: Engaged Affect (typically observed): Accepting Orientation: : Oriented to Self, Oriented to Place, Oriented to  Time, Oriented to Situation Alcohol / Substance Use: Not Applicable Psych Involvement: No (comment)  Admission diagnosis:  Abdominal Pain Patient Active Problem List   Diagnosis Date Noted  . SBO (small bowel obstruction) s/p lysis of adhesions 01/14/2019 01/12/2019  . Heart murmur 06/20/2018  . Arthritis of left hip 11/23/2017  . Presbyopia of both eyes 03/22/2017  . Pseudophakia of right eye 03/22/2017  . Ocular hypertension, bilateral 11/29/2016  . Primary open-angle glaucoma, bilateral, mild stage 11/20/2016  . Neuropathic pain 07/20/2016  . Essential hypertension 09/22/2015  . Mixed hyperlipidemia 09/22/2015  .  Acquired hypothyroidism 09/22/2015  . Chronic GERD 09/22/2015  . Diabetes mellitus type 2 in nonobese (HCC) 09/22/2015   PCP:  Sharren BridgeSellers, Billie H, NP Pharmacy:   [facility] [phone]#[id number] - [place], [place] - [address] [facility] [address] [place] [phone] Phone: [phone] Fax: [phone]     Social Determinants of Health (SDOH) Interventions    Readmission Risk Interventions Readmission Risk Prevention Plan 02/11/2019  Transportation Screening Complete  PCP or Specialist Appt within 3-5 Days Complete  HRI or Home Care Consult Complete  Social Work Consult for Recovery Care Planning/Counseling Complete  Palliative Care Screening Not Applicable  Medication Review Oceanographer(RN Care Manager) Complete

## 2019-02-11 NOTE — Progress Notes (Signed)
Home health agencies that serve [phone].        [place] Quality of Patient Care Rating Patient Survey Summary Rating  [facility] [phone] 3 out of 5 stars 4 out of [place] [phone] 3  out of 5 stars 4 out of [place] [phone] 4 out of 5 stars 4 out of [place] [phone] 4 out of 5 stars 4 out of [place] [phone] 4  out of 5 stars 4 out of 5 stars  [place] [phone] 4 out of 5 stars 4 out of 5 stars  [place] [phone] 4 out of 5 stars 4 out of [place] [phone] 4 out of 5 stars 4 out of [place] [phone] 3  out of 5 stars 4 out of [place] [phone] 2  out of 5 stars 3 out of [place] [phone] 3 out of 5 stars 4 out of 5 stars  [facility] [phone] 4 out of 5 stars Not [place] [phone] 3 out of 5 stars 4 out of 5 stars  [facility] [phone] 3  out of 5 stars 3 out of 5 stars  [facility] [phone] 3  out of 5 stars Not Available9  [facility] [phone] 3  out of 5 stars 4 out of [place] [phone] 3  out of 5 stars 4 out of [place] [phone] 3  out of 5 stars 3 out of [place] [phone] 3  out of 5 stars Not [place] [phone] 3 out of 5 stars 3 out of [place] [phone] 3  out of 5 stars 4 out of [place] [phone] 4  out of 5 stars 3 out  of [place] number Footnote as displayed on [place]  1 This agency provides services under a federal waiver program to non-traditional, chronic long term population.  2 This agency provides services to a special needs population.  3 Not Available.  4 The number of patient episodes for this measure is too small to report.  5 This measure currently does not have data or provider has been certified/recertified for less than 6 months.  6 The national average for this measure is not provided because of state-to-state differences in data collection.  7 Medicare is not displaying rates for this measure for any home health agency, because of an issue with the data.  8 There were problems with the data and they are being corrected.  9 Zero, or very few, patients met the survey's rules for inclusion. The scores shown, if any, reflect a very small number of surveys and may not accurately tell how an agency is doing.  10 Survey results are based on less than 12 months  of data.  11 Fewer than 70 patients completed the survey. Use the scores shown, if any, with caution as the number of surveys may be too low to accurately tell how an agency is doing.  12 No survey results are available for this period.  13 Data suppressed by CMS for one or more quarters.

## 2019-02-11 NOTE — Progress Notes (Signed)
Dr Maylene Roes paged to inquire about PICC line removal. Donne Hazel, RNM

## 2019-02-11 NOTE — Discharge Instructions (Signed)
Full Liquid Diet A full liquid diet refers to fluids and foods that are liquid or will become liquid at room temperature. This diet should only be used for a short period of time to help you recover from illness or surgery. Your health care provider or dietitian will help you determine when it is safe to eat regular foods. What are tips for following this plan?     Reading food labels  Check food labels of nutrition shakes for the amount of protein. Look for nutrition shakes that have at least 8-10 grams of protein in each serving.  Look for drinks, such as milks and juices, that are "fortified" or "enriched." This means that vitamins and minerals have been added. Shopping  Buy premade nutritional shakes to keep on hand.  To vary your choices, buy different flavors of milks and shakes. Meal planning  Choose flavors and foods that you enjoy.  To make sure you get enough energy from food (calories): ? Eat 3 full liquid meals each day. Have a liquid snack between each meal. ? Drink 6-8 ounces (177-237 ml) of a nutrition supplement shake with meals or as snacks. ? Add protein powder, powdered milk, milk, or yogurt to shakes to increase the amount of protein.  Drink at least one serving a day of citrus fruit juice or fruit juice that has vitamin C added. General guidelines  Before starting the full-liquid diet, check with your health care provider to know what foods you should avoid. These may include full-fat or high-fiber liquids.  You may have any liquid or food that becomes a liquid at room temperature. The food is considered a liquid if it can be poured off a spoon at room temperature.  Do not drink alcohol unless approved by your health care provider.  This diet gives you most of the nutrients that you need for energy, but you may not get enough of certain vitamins, minerals, and fiber. Make sure to talk to your health care provider or dietitian about: ? How many calories you need  to eat get day. ? How much fluid you should have each day. ? Taking a multivitamin or a nutritional supplement. What foods are allowed? The items listed may not be a complete list. Talk with your dietitian about what dietary choices are best for you. Grains Thin hot cereal, such as cream of wheat. Soft-cooked pasta or rice pured in soup. Vegetables Pulp-free tomato or vegetable juice. Vegetables pured in soup. Fruits Fruit juice without pulp. Strained fruit pures (seeds and skins removed). Meats and other protein foods Beef, chicken, and fish broths. Powdered protein supplements. Dairy Milk and milk-based beverages, including milk shakes and instant breakfast mixes. Smooth yogurt. Pured cottage cheese. Beverages Water. Coffee and tea (caffeinated or decaffeinated). Cocoa. Liquid nutritional supplements. Soft drinks. Nondairy milks, such as almond, coconut, rice, or soy milk. Fats and oils Melted margarine and butter. Cream. Canola, almond, avocado, corn, grapeseed, sunflower, and sesame oils. Gravy. Sweets and desserts Custard. Pudding. Flavored gelatin. Smooth ice cream (without nuts or candy pieces). Sherbet. Popsicles. [place] ice. Pudding pops. Seasoning and other foods Salt and pepper. Spices. Cocoa powder. Vinegar. Ketchup. Yellow mustard. Smooth sauces, such as [place], cheese sauce, or white sauce. Soy sauce. Cream soups. Strained soups. Syrup. Honey. Jelly (without fruit pieces). What foods are not allowed? The items listed may not be a complete list. Talk with your dietitian about what dietary choices are best for you. Grains Whole grains. Pasta. Rice. Cold cereal. Bread. Crackers.  Vegetables All whole fresh, frozen, or canned vegetables. Fruits All whole fresh, frozen, or canned fruits. Meats and other protein foods All cuts of meat, poultry, and fish. Eggs. Tofu and soy protein. Nuts and nut butters. Lunch meat. Sausage. Dairy Hard cheese. Yogurt with fruit  chunks. Fats and oils Coconut oil. Palm oil. Lard. Cold butter. Sweets and desserts Ice cream or other frozen desserts that have any solids in them or on top, such as nuts, chocolate chips, and pieces of cookies. Cakes. Cookies. Candy. Seasoning and other foods Stone-ground mustards. Soups with chunks or pieces. Summary  A full liquid diet refers to fluids and foods that are liquid or will become liquid at room temperature.  This diet should only be used for a short period of time to help you recover from illness or surgery. Ask your health care provider or dietitian when it is safe for you to eat regular foods.  To make sure you get enough calories and nutrients, eat 3 meals each day with snacks between. Drink premade nutrition supplement shakes or add protein powder to homemade shakes. Take a vitamin and mineral supplement as told by your health care provider. This information is not intended to replace advice given to you by your health care provider. Make sure you discuss any questions you have with your health care provider. Document Released: 07/24/2005 Document Revised: 10/20/2017 Document Reviewed: 09/06/2016 Elsevier Patient Education  2020 [employer]  CCS      [facility], [place] [phone]  OPEN ABDOMINAL SURGERY: POST OP INSTRUCTIONS Your incision is well healed and you can take a shower and wash area with soap and water. Always review your discharge instruction sheet given to you by the facility where your surgery was performed.  IF YOU HAVE DISABILITY OR FAMILY LEAVE FORMS, YOU MUST BRING THEM TO THE OFFICE FOR PROCESSING.  PLEASE DO NOT GIVE THEM TO YOUR DOCTOR.  1. A prescription for pain medication may be given to you upon discharge.  Take your pain medication as prescribed, if needed.  If narcotic pain medicine is not needed, then you may take acetaminophen (Tylenol) or ibuprofen (Advil) as needed. 2. Take your usually prescribed medications unless  otherwise directed. 3. If you need a refill on your pain medication, please contact your pharmacy. They will contact our office to request authorization.  Prescriptions will not be filled after 5pm or on week-ends. 4. You should follow a light diet the first few days after arrival home, such as soup and crackers, pudding, etc.unless your doctor has advised otherwise. A high-fiber, low fat diet can be resumed as tolerated.   Be sure to include lots of fluids daily. Most patients will experience some swelling and bruising on the chest and neck area.  Ice packs will help.  Swelling and bruising can take several days to resolve 5. Most patients will experience some swelling and bruising in the area of the incision. Ice pack will help. Swelling and bruising can take several days to resolve..  6. It is common to experience some constipation if taking pain medication after surgery.  Increasing fluid intake and taking a stool softener will usually help or prevent this problem from occurring.  A mild laxative (Milk of Magnesia or Miralax) should be taken according to package directions if there are no bowel movements after 48 hours. 7.  You may have steri-strips (small skin tapes) in place directly over the incision.  These strips should be left on the skin for 7-10 days.  If your surgeon used skin glue on the incision, you may shower in 24 hours.  The glue will flake off over the next 2-3 weeks.  Any sutures or staples will be removed at the office during your follow-up visit. You may find that a light gauze bandage over your incision may keep your staples from being rubbed or pulled. You may shower and replace the bandage daily. 8. ACTIVITIES:  You may resume regular (light) daily activities beginning the next day--such as daily self-care, walking, climbing stairs--gradually increasing activities as tolerated.  You may have sexual intercourse when it is comfortable.  Refrain from any heavy lifting or straining until  approved by your doctor. a. You may drive when you no longer are taking prescription pain medication, you can comfortably wear a seatbelt, and you can safely maneuver your car and apply brakes b. Return to Work: ___________________________________ 9. You should see your doctor in the office for a follow-up appointment approximately two weeks after your surgery.  Make sure that you call for this appointment within a day or two after you arrive home to insure a convenient appointment time. OTHER INSTRUCTIONS:  _____________________________________________________________ _____________________________________________________________  WHEN TO CALL YOUR DOCTOR: 1. Fever over 101.0 2. Inability to urinate 3. Nausea and/or vomiting 4. Extreme swelling or bruising 5. Continued bleeding from incision. 6. Increased pain, redness, or drainage from the incision. 7. Difficulty swallowing or breathing 8. Muscle cramping or spasms. 9. Numbness or tingling in hands or feet or around lips.  The clinic staff is available to answer your questions during regular business hours.  Please dont hesitate to call and ask to speak to one of the nurses if you have concerns.  For further questions, please visit [email or web address]   FULL LIQUID DIET The full liquid diet includes mostly liquids (including milk) and some foods with small amounts of fiber.  The full liquid diet can provide many of the nutrients your body needs, but it may not give enough vitamins, minerals, and fiber. A fluid is anything that is liquid or anything that would melt if left at room temperature.   You will need to count these foods and liquids--including any liquid used to take medication--as part of your daily fluid intake.   Some examples are: Coffee, tea, and other hot beverages Gelatin  Gravy Ice cream, sherbet, sorbet Ice cubes, ice chips Milk, liquid creamer Nutritional supplements Popsicles Vegetable and fruit  juices; fluid in canned fruit (fruit itself is not allowed) Yogurt (without nuts, seeds, or fruit) Soft drinks, lemonade, limeade Soups (strained) Syrup   Tips Determine the which foods/fluids from the list are most appealing to you. Eat or drink your favorite flavors to help you better enjoy this diet. Include milk-based or dairy-type fluids and juices. If you are lactose intolerant, choose nut and seed milks. Eat 3 full liquid meals throughout the day and include a snack time between each meal. Drink nutritional shakes in 6-8 ounce servings as part of a meal or between meals to make sure youre taking in enough calories.  You can make fortified shakes for yourself or buy them premade at a store.  Foods Recommended  Grains Thin hot cereal, such as cream of wheat  Dairy Milk: Nonfat, 1%, 2%, whole Soy milk, almond milk, rice milk, coconut milk, cashew milk Milkshakes Yogurt Custard Pudding  Vegetables Vegetable juice with or without pulp Thin, pureed vegetable soups  Fruits Translucent fruit juices without pulp (apple, cranberry, grape)  Oils Almond, avocado, canola, cashew, corn,  grapeseed, olive, safflower, sesame, soybean, sunflower Butter (melted) Margarine (melted) that does not contain trans fat (read the product label)  Other Flavored gelatin (any flavor) Strained cream soups Chicken, beef, or vegetable broths Popsicle  Beverages Water Ice Soda Tea Coffee Nutritional supplements or shakes  A high-protein shake should contain at least 8-10 grams of protein per serving.  Read product labels to find a shake that is high in protein.  If you are preparing the shake at home, you can increase the amount of protein by adding protein powder, non-fat dry milk powder, yogurt, or low-fat milk   Foods Not Recommended  Grains All grain foods including whole grains, processed grains such as pasta, rice, cold cereals, bread, snacks, and sweets that are flour-based  (cakes, cookies)  Protein Foods Beef and pork (all cuts) Chicken and [place] (all cuts) Fish (all types) Nuts and nut butters (all types) Eggs (all types) All meat substitutes (such as soy and tofu) All old cuts or lunch meat (such as salami, ham) Sausage (all types)  Dairy Hard cheese Yogurt with fruit chunks  Vegetables Whole, frozen, fresh, canned varieties   Fruits Whole, frozen, fresh, canned varieties   Oils Butter Coconut oil Palm Oil Lard  Full Liquid Nutrition Therapy Sample 1-Day Menu  Breakfast 1/2 cup orange juice (without pulp) 1 cup cream of wheat 1 cup skim milk 6 ounces nonfat yogurt (without nuts, seeds, or fruit) 8 ounces coffee Lunch 1 cup apple juice 1 cup tomato soup 1/2 cup chocolate pudding 1 cup high protein chocolate shake 8 ounces tea Evening Meal 1/2 cup grape juice 1 cup skim milk 1 cup high-protein vanilla shake 1 cup strained, blended cream of broccoli soup 1/4 cup custard Evening Snack 1 cup high-protein strawberry shake (without seeds)

## 2019-02-11 NOTE — Progress Notes (Signed)
NUTRITION NOTE  Discharge order and discharge summary entered into the chart earlier this AM. Plan for d/c home with Home Health.   Will put full liquids diet education handouts in Discharge Instructions as the plan is for patient to remain on FLD for the foreseeable future.     Jarome Matin, MS, RD, LDN, [facility] Inpatient Clinical Dietitian Pager # [phone] After hours/weekend pager # [phone]

## 2019-02-11 NOTE — Discharge Summary (Signed)
Physician Discharge Summary  Christie Hinton ZOX:[id number] DOB: [birth year] DOA: 01/12/2019  PCP: Sharren Bridge, NP  Admit date: 01/12/2019 Discharge date: 02/11/2019  Admitted From: Home Disposition:  Home  Recommendations for Outpatient Follow-up:  1. Follow up with PCP in 1 week 2. Follow up with Dr. Derrell Lolling   Home Health: PT RN  Equipment/Devices: None   Discharge Condition: Stable, improved CODE STATUS: Full  Diet recommendation: Full liquid diet   Brief/Interim Summary: Christie Hinton is a 85 year old female who presented abdominal pain, nausea or vomiting. She does have significant past medical history for hypertension, dyslipidemia and GERD. She reported generalized abdominal pain after a meal, associated with vomiting. She presented to the hospital due to progressive abdominal pain. On her initial physical examination her temperature was 97.7, heart rate 76, blood pressure 166/82, oxygen saturation 99%. Her lungs are clear to auscultation bilaterally, heart S1-S2 present rhythmic, the abdomen was soft, generalized abdominal pain to palpation, hypoactive bowel sounds, no lower extremity edema. CT of the abdomen with a small bowel obstruction, dilated loops of small bowel in the left abdomen with mesenteric edema and a well-defined transition point.Findings concerning for closed-loop obstruction, possibly related to an internal hernia. Patient was admitted to the hospital working diagnosis of small bowel obstruction. Underwent exploratory laparotomy on 01/14/19, lysis of adhesions. Complicated with post operative ileus. She required TPN throughout her hospitalization and was discontinued prior to discharge. On day of discharge, she was tolerating full liquid diet without any issues.  Discharge Diagnoses:   Small bowel obstructionsp exploratory laparotomy 01/14/19, with post operative ileus. -General surgery following, decrease TPN to try to stimulate appetite and p.o. intake.   Megace started.  Calorie count in process.   -Unable to tolerate gastric emptying study -CT abdomen pelvis completed yesterday which revealed dilated loops of small bowel, low to moderate grade small bowel obstruction, large hiatal hernia, wall thickening of the stomach, sigmoid diverticulosis without diverticulitis -TPN discontinued -Continue full liquid diet.  Patient will continue Megace, Reglan on discharge  HTN -Blood pressure well controlled off of antihypertensive medication.  Will discontinue at discharge  Hypothyroidism -Continues Synthroid  Dyslipidemia -Continue statin  T2DM -Sliding scale insulin, while in the hospital  Discharge Instructions  Discharge Instructions    Call MD for:  difficulty breathing, headache or visual disturbances   Complete by: As directed    Call MD for:  extreme fatigue   Complete by: As directed    Call MD for:  hives   Complete by: As directed    Call MD for:  persistant dizziness or light-headedness   Complete by: As directed    Call MD for:  persistant nausea and vomiting   Complete by: As directed    Call MD for:  redness, tenderness, or signs of infection (pain, swelling, redness, odor or green/yellow discharge around incision site)   Complete by: As directed    Call MD for:  severe uncontrolled pain   Complete by: As directed    Call MD for:  temperature >100.4   Complete by: As directed    Diet full liquid   Complete by: As directed    Discharge instructions   Complete by: As directed    You were cared for by a hospitalist during your hospital stay. If you have any questions about your discharge medications or the care you received while you were in the hospital after you are discharged, you can call the unit and ask to speak with the hospitalist on  call if the hospitalist that took care of you is not available. Once you are discharged, your primary care physician will handle any further medical issues. Please note that NO  REFILLS for any discharge medications will be authorized once you are discharged, as it is imperative that you return to your primary care physician (or establish a relationship with a primary care physician if you do not have one) for your aftercare needs so that they can reassess your need for medications and monitor your lab values.   Increase activity slowly   Complete by: As directed      Allergies as of 02/11/2019      Reactions   Piroxicam Rash   Piroxicam is Feldene      Medication List    STOP taking these medications   losartan-hydrochlorothiazide 100-25 MG tablet Commonly known as: HYZAAR     TAKE these medications   atorvastatin 20 MG tablet Commonly known as: LIPITOR Take 20 mg by mouth daily.   docusate sodium 100 MG capsule Commonly known as: COLACE Take 1 capsule (100 mg total) by mouth 2 (two) times daily.   latanoprost 0.005 % ophthalmic solution Commonly known as: XALATAN Place 1 drop into both eyes at bedtime.   levothyroxine 50 MCG tablet Commonly known as: SYNTHROID Take 50 mcg by mouth daily.   megestrol 400 MG/10ML suspension Commonly known as: MEGACE Take 20 mLs (800 mg total) by mouth daily.   metoCLOPramide 5 MG tablet Commonly known as: REGLAN Take 1 tablet (5 mg total) by mouth 3 (three) times daily before meals.   omeprazole 40 MG capsule Commonly known as: PRILOSEC Take 1 capsule (40 mg total) by mouth 2 (two) times a day. What changed: when to take this   polyethylene glycol 17 g packet Commonly known as: MIRALAX / GLYCOLAX Take 17 g by mouth daily.      Follow-up Information    Axel Filleramirez, Armando, MD. Call.   Specialty: General Surgery Why: To schedule an appointment  Contact information: [address] [place] [address] [phone]        Sharren BridgeSellers, Billie H, NP. Schedule an appointment as soon as possible for a visit in 1 week(s).   Specialty: Internal Medicine Contact information: [id number] [place] [address] [phone]          Allergies  Allergen Reactions  . Piroxicam Rash    Piroxicam is Feldene     Consultations:  General surgery   Procedures/Studies: Dg Abd 1 View  Result Date: 01/16/2019 CLINICAL DATA:  Abdominal distension EXAM: ABDOMEN - 1 VIEW COMPARISON:  01/15/2019 FINDINGS: Persistent small bowel dilatation is noted consistent with at least partial small bowel obstruction. Contrast is noted within the right colon administered on prior CT examination. Postsurgical changes are seen. IMPRESSION: Stable small bowel dilatation. This may be related to postoperative ileus although persistent small-bowel obstruction deserves consideration as well. Correlation with the physical exam is recommended. Electronically Signed   By: Alcide CleverMark  Lukens M.D.   On: 01/16/2019 10:21   Dg Abd 1 View  Result Date: 01/15/2019 CLINICAL DATA:  Small-bowel obstruction EXAM: ABDOMEN - 1 VIEW COMPARISON:  01/14/2019 FINDINGS: Persistent markedly dilated small intestine consistent with ongoing small bowel obstruction. Collapsed colon containing some contrast. IMPRESSION: Persistent small bowel obstruction pattern. No visible nasogastric tube. Electronically Signed   By: Paulina FusiMark  Shogry M.D.   On: 01/15/2019 11:20   X-ray Abdomen Ap  Result Date: 01/14/2019 CLINICAL DATA:  Nasogastric tube placement. EXAM: ABDOMEN - 1 VIEW COMPARISON:  Radiograph of same day. FINDINGS: Slightly decreased small bowel dilatation is noted. These findings remain suspicious for distal small bowel obstruction. Midline surgical staples are noted. Nasogastric tube is seen looped within probable hiatal hernia above the diaphragm. IMPRESSION: Nasogastric tube tip appears to be looped within the hiatal hernia above the diaphragm. Slightly decreased small bowel dilatation is noted as described above. Electronically Signed   By: Lupita Raider M.D.   On: 01/14/2019 14:40   X-ray Abdomen Ap  Result  Date: 01/14/2019 CLINICAL DATA:  Nasogastric tube placement. EXAM: ABDOMEN - 1 VIEW COMPARISON:  Radiograph of January 14, 2019. FINDINGS: Stable severe small bowel dilatation is noted consistent with distal small bowel obstruction. Midline surgical staples are noted. No abnormal calcifications are noted. Nasogastric tube appears to be going into the right mainstem and lower lobe bronchus. IMPRESSION: Nasogastric tube is seen going into right mainstem and lower lobe bronchus. Immediate withdrawal is recommended. Critical Value/emergent results were called by telephone at the time of interpretation on 01/14/2019 at 1:42 pm toAshura, who verbally acknowledged these results and stated that they have already removed the nasogastric tube and replaced it. Stable small bowel dilatation is noted consistent with distal small bowel obstruction. Electronically Signed   By: Lupita Raider M.D.   On: 01/14/2019 13:43   Ct Abdomen Pelvis W Contrast  Result Date: 02/06/2019 CLINICAL DATA:  Nausea and vomiting EXAM: CT ABDOMEN AND PELVIS WITH CONTRAST TECHNIQUE: Multidetector CT imaging of the abdomen and pelvis was performed using the standard protocol following bolus administration of intravenous contrast. CONTRAST:  OMNIPAQUE IOHEXOL 300 MG/ML  SOLN COMPARISON:  CT dated January 12, 2019. FINDINGS: Lower chest: The lung bases are clear. The heart size is normal. Hepatobiliary: There is a stable hypoattenuating area in hepatic segment 6, likely representing a benign process such as a cyst or hemangioma. Normal gallbladder.There is stable extrahepatic biliary ductal dilatation. Pancreas: Normal contours without ductal dilatation. No peripancreatic fluid collection. Spleen: No splenic laceration or hematoma. Adrenals/Urinary Tract: --Adrenal glands: No adrenal hemorrhage. --Right kidney/ureter: No hydronephrosis or perinephric hematoma. --Left kidney/ureter: No hydronephrosis or perinephric hematoma. --Urinary bladder: Unremarkable.  Stomach/Bowel: --Stomach/Duodenum: There is a large hiatal hernia, similar to prior study. There is some mild gastric wall thickening of unknown clinical significance. --Small bowel: There are few dilated loops of small bowel measuring up to approximately 3.5-4 cm. There are air-fluid levels involving these small bowel loops. There appears to be a transition point in the left lower quadrant. --Colon: Rectosigmoid diverticulosis without acute inflammation. --Appendix: Normal. Vascular/Lymphatic: Atherosclerotic calcification is present within the non-aneurysmal abdominal aorta, without hemodynamically significant stenosis. There is a retroaortic left renal vein, a normal variant. --No retroperitoneal lymphadenopathy. --No mesenteric lymphadenopathy. --No pelvic or inguinal lymphadenopathy. Reproductive: Unremarkable Other: No ascites or free air. The abdominal wall is normal. Musculoskeletal. Multilevel degenerative disc disease and facet arthrosis. No bony spinal canal stenosis. Patient is status post total hip arthroplasty on the left without evidence of a periprosthetic fracture involving visualized portions of the left femur. IMPRESSION: 1. Again noted are dilated loops of small bowel with a transition point in the left lower quadrant. Findings are consistent with a low to moderate grade small bowel obstruction. 2. Large hiatal hernia. 3. Apparent wall thickening of the stomach which is similar to prior study and is of unknown clinical significance. This may be secondary to overall underdistention. 4. Sigmoid diverticulosis without CT evidence of diverticulitis.  Electronically Signed   By: Constance Holster M.D.   On: 02/06/2019 20:21   Ct Abdomen Pelvis W Contrast  Result Date: 01/12/2019 CLINICAL DATA:  85 year old with bowel obstruction. Acute lower abdominal pain with nausea. EXAM: CT ABDOMEN AND PELVIS WITH CONTRAST TECHNIQUE: Multidetector CT imaging of the abdomen and pelvis was performed using the  standard protocol following bolus administration of intravenous contrast. CONTRAST:  199mL OMNIPAQUE IOHEXOL 300 MG/ML  SOLN COMPARISON:  None. FINDINGS: Lower chest: Lung bases are clear.  No pleural effusions. Hepatobiliary: Normal appearance of the liver. Small hypodensity in the right hepatic lobe on image 19 is probably an incidental finding. Portal venous system is patent. Normal appearance of the gallbladder without inflammatory changes. No biliary dilatation. Pancreas: Unremarkable. No pancreatic ductal dilatation or surrounding inflammatory changes. Spleen: Normal in size without focal abnormality. Adrenals/Urinary Tract: Normal adrenal glands. Urinary bladder is unremarkable but slightly limited evaluation due to artifact from left hip replacement. Normal appearance of both kidneys without hydronephrosis. Stomach/Bowel: Extensive diverticulosis in the sigmoid colon and left colon without acute colonic inflammation. Normal appendix. Moderate sized hiatal hernia. Normal appearance of the duodenum. Dilated small bowel loops in the left abdomen compatible with jejunum. There is mesenteric edema associated with these dilated small bowel loops. Small bowel loops measure up to 3.3 cm. Distal small bowel is decompressed. Small bowel transition point in the left abdomen near the mesenteric edema. Transition point best seen on the coronal reformats, sequence 5 image 42 and sagittal reformats, sequence 6, image 84. Irregularity of the mesentery and fat near the bowel obstruction and raises concern for a closed loop obstruction and possibly an internal hernia. No evidence for pneumatosis or portal air. Vascular/Lymphatic: Atherosclerotic disease in the abdominal aorta without aneurysm. No abdominopelvic lymphadenopathy. Reproductive: Uterus and bilateral adnexa are unremarkable. Other: Small amount of free fluid in the pelvis. Minimal fluid in the left paracolic gutter. Mesenteric edema in the left abdomen associated  with the dilated small bowel loops. Negative for free air. Musculoskeletal: No acute bone abnormality in the spine. Extensive facet arthropathy. IMPRESSION: 1. Small bowel obstruction. Dilated loops of small bowel in the left abdomen with mesenteric edema and well-defined transition point. Findings raise concern for a closed loop obstruction, possibly related to an internal hernia. Small amount of fluid in the abdomen and pelvis. 2. Colonic diverticulosis without acute colonic inflammation. 3. Moderate sized hiatal hernia. These results were called by telephone at the time of interpretation on 01/12/2019 at 12:08 pm to Dr. Gareth Morgan , who verbally acknowledged these results. Electronically Signed   By: Markus Daft M.D.   On: 01/12/2019 12:16   Dg Abd 2 Views  Result Date: 01/19/2019 CLINICAL DATA:  Recent exploratory laparotomy with lysis of adhesions for small-bowel obstruction. Abdominal distension. EXAM: ABDOMEN - 2 VIEW COMPARISON:  01/16/2019 FINDINGS: The enteric tube has been removed. Postsurgical changes are again noted with skin staples remaining in place. Moderate to severe diffuse small bowel dilatation is similar to the prior study. Gas is present in nondilated colon. Residual oral contrast material has migrated distally and is now located in the descending and sigmoid colon. No intraperitoneal free air is identified. Prior left hip arthroplasty is noted. IMPRESSION: Unchanged small bowel dilatation concerning for obstruction. Electronically Signed   By: Logan Bores M.D.   On: 01/19/2019 14:25   Dg Abd Portable 1v  Result Date: 02/03/2019 CLINICAL DATA:  Postoperative ileus EXAM: PORTABLE ABDOMEN - 1 VIEW COMPARISON:  Portable exam 1021 hours  compared to 01/28/2019 FINDINGS: Distended air-filled small bowel loops throughout abdomen. Scattered gas in colon. Findings are consistent with a postoperative ileus, little changed. No definite bowel wall thickening. Bones demineralized with  degenerative changes of the spine and RIGHT hip. LEFT hip prosthesis noted. IMPRESSION: Persistent postoperative ileus. Electronically Signed   By: Ulyses Southward M.D.   On: 02/03/2019 10:37   Dg Abd Portable 1v  Result Date: 01/28/2019 CLINICAL DATA:  Ileus follow-up. EXAM: PORTABLE ABDOMEN - 1 VIEW COMPARISON:  Abdominal x-ray dated January 25, 2019. FINDINGS: Unchanged enteric tube terminating in the distal esophagus. Persistent diffuse small bowel dilatation up to 5.7 cm, minimally improved. No radio-opaque calculi or other significant radiographic abnormality are seen. IMPRESSION: 1. Persistent ileus, minimally improved. 2. Unchanged enteric tube with the tip in the distal esophagus. Recommend advancement. Electronically Signed   By: Obie Dredge M.D.   On: 01/28/2019 15:35   Dg Abd Portable 1v  Result Date: 01/25/2019 CLINICAL DATA:  Ileus EXAM: PORTABLE ABDOMEN - 1 VIEW COMPARISON:  01/24/2019 abdominal radiograph FINDINGS: Enteric tube terminates at the esophagogastric junction. Vertical skin staples overlie the medial left abdomen. Partially visualized left total hip arthroplasty. Prominent diffuse small bowel dilatation up to the 6.1 cm diameter, minimally improved. No evidence of pneumatosis or pneumoperitoneum. Minimal colonic stool. No radiopaque nephrolithiasis. IMPRESSION: 1. Enteric tube terminates at the esophagogastric junction. 2. Prominent diffuse small bowel dilatation, minimally improved, compatible with severe postoperative adynamic ileus. Electronically Signed   By: Delbert Phenix M.D.   On: 01/25/2019 07:09   Dg Abd Portable 1v  Result Date: 01/24/2019 CLINICAL DATA:  Vomiting.  Abdominal pain. EXAM: PORTABLE ABDOMEN - 1 VIEW COMPARISON:  01/23/2019.  01/19/2019. FINDINGS: Surgical staples noted over the abdomen. Persistent prominently dilated small bowel loops are again noted. Small-bowel distention may have increased from prior exam. Paucity of intra colonic air. No free air.  Degenerative changes lumbar spine right hip. Total left hip replacement. IMPRESSION: Persistent prominent dilated small bowel loops are again noted. Small-bowel distention may have progressed from prior exam. Findings consistent small bowel obstruction. Electronically Signed   By: Maisie Fus  Register   On: 01/24/2019 07:16   Dg Abd Portable 1v  Result Date: 01/23/2019 CLINICAL DATA:  Vomiting, ileus postop EXAM: PORTABLE ABDOMEN - 1 VIEW COMPARISON:  01/19/2019 FINDINGS: Marked gaseous distention of predominantly small bowel loops. Gas noted within nondistended stomach and large bowel. Appearance is concerning for small bowel obstruction. No visible free air organomegaly. IMPRESSION: Continued marked dilatation of small bowel loops concerning for persistent small bowel obstruction. Electronically Signed   By: Charlett Nose M.D.   On: 01/23/2019 19:50   Dg Abd Portable 1v  Result Date: 01/23/2019 CLINICAL DATA:  NG placement EXAM: PORTABLE ABDOMEN - 1 VIEW COMPARISON:  01/23/2019 FINDINGS: NG tip within hiatal hernia.  NG above the diaphragm. Dilated small bowel loops with mild improvement since earlier today. Skin staples in the midline of the abdomen. IMPRESSION: NG tip coiled in hiatal hernia.  Decrease small bowel dilatation. Electronically Signed   By: Marlan Palau M.D.   On: 01/23/2019 18:36   Dg Abd Portable 1v  Result Date: 01/14/2019 CLINICAL DATA:  Small-bowel obstruction EXAM: PORTABLE ABDOMEN - 1 VIEW COMPARISON:  01/13/2019 FINDINGS: Gastric catheter is noted within the stomach. Persistent and increased small-bowel dilatation is noted when compare with the previous day. Some colonic gas is noted and stable. No free air is seen. IMPRESSION: Increasing small bowel dilatation when compared with the previous day  Electronically Signed   By: Alcide CleverMark  Lukens M.D.   On: 01/14/2019 08:46   Dg Abd Portable 1v-small Bowel Obstruction Protocol-initial, 8 Hr Delay  Result Date: 01/13/2019 CLINICAL DATA:   Small-bowel obstruction. EXAM: PORTABLE ABDOMEN - 1 VIEW COMPARISON:  01/13/2019 FINDINGS: The enteric tube projects over the gastric body. The patient's oral contrast is not well appreciated on this exam and is favored to remain within the small bowel. There are multiple dilated loops of small bowel measuring up to approximately 4.5 cm. There is no definite pneumatosis or free air. There is a moderate amount of stool in the ascending colon. The bladder appears to be partially distended with contrast. IMPRESSION: 1. Oral contrast is not well appreciated on this exam but is favored to reside within the small bowel. No definite oral contrast visualized in the colon. 2. Persistently dilated loops of small bowel measuring up to approximately 4.5 cm in diameter. 3. Enteric tube projects over the left upper quadrant. Electronically Signed   By: Katherine Mantlehristopher  Green M.D.   On: 01/13/2019 20:00   Dg Abd Portable 1v-small Bowel Obstruction Protocol-initial, 8 Hr Delay  Result Date: 01/13/2019 CLINICAL DATA:  Small bowel obstruction. EXAM: PORTABLE ABDOMEN - 1 VIEW COMPARISON:  One-view abdomen 01/12/2019 FINDINGS: NG tube remains in place. Dilated loops of small bowel are slightly less prominent than on the prior study. Gas is present throughout the colon. Contrast is noted within the urinary bladder. Left hip replacement is evident. IMPRESSION: 1. Improving small bowel obstruction with NG tube in place. Electronically Signed   By: Marin Robertshristopher  Mattern M.D.   On: 01/13/2019 07:42   Dg Abd Portable 1v-small Bowel Protocol-position Verification  Result Date: 01/12/2019 CLINICAL DATA:  85 year old female with history of nasogastric tube placement. Evaluate for bowel obstruction. EXAM: PORTABLE ABDOMEN - 1 VIEW COMPARISON:  CT the abdomen and pelvis 01/12/2019. FINDINGS: Multiple prominent mildly dilated loops of gas-filled small bowel in the central abdomen measuring up to 3.4 cm in diameter. A small amount of gas and stool  is noted in the colon. No pneumoperitoneum on this single supine view. Nasogastric tube extends into the proximal body of the stomach. Either native contrast filling the urinary bladder, related to recent contrast enhanced CT examination. Status post left hip arthroplasty. IMPRESSION: 1. Tip of nasogastric tube is in the proximal body of the stomach. 2. Bowel gas pattern again suggests early or partial small bowel obstruction. Electronically Signed   By: Trudie Reedaniel  Entrikin M.D.   On: 01/12/2019 19:09   Dg Vangie BickerNaso G Tube Plc W/fl W/rad  Result Date: 01/24/2019 CLINICAL DATA:  Small-bowel distention.  NG tube for decompression. EXAM: NASO G TUBE PLACEMENT WITH FL AND WITH RAD CONTRAST:  None. FLUOROSCOPY TIME:  Fluoroscopy Time:  0 minutes and 48 seconds. Radiation Exposure Index (if provided by the fluoroscopic device): 16.7 mGy Number of Acquired Spot Images: 0 COMPARISON:  Abdomen film from 01/24/2020. Prior NG tube placement 01/15/2019. FINDINGS: NG tube was placed via the patient's right nostril and easily directed into the patient's known moderate to large hiatal hernia. Multiple attempts were made to direct the NG tube through the hernia into the subdiaphragmatic portion of the stomach, but these were unsuccessful. IMPRESSION: Successful NG tube placement into the supradiaphragmatic portion of the stomach in this patient with moderate to large hiatal hernia. As before, the tube could not be directed into the infra diaphragmatic portion of the stomach due to orientation of the hiatal hernia. I discussed this study with the  patient's nurse Beth, at 1451 hours on 01/24/2019. Electronically Signed   By: Kennith Center M.D.   On: 01/24/2019 14:46   Dg Intro Long Gi Tube  Result Date: 01/15/2019 CLINICAL DATA:  Small bowel obstruction.  Recent lysis of adhesions. EXAM: INTRO LONG GI TUBE CONTRAST:  50mL OMNIPAQUE IOHEXOL 300 MG/ML  SOLN FLUOROSCOPY TIME:  Fluoroscopy Time:  12 Radiation Exposure Index (if provided  by the fluoroscopic device): 203.4 mGy Number of Acquired Spot Images: 1 COMPARISON:  CT 01/12/2019 FINDINGS: The patient arrived with new G-tube in the distal esophagus. Patient has a large hiatal hernia. Multiple attempts were made to pass the NG tube through the hiatal hernia. Contrast and air were administered into the hernia/stomach. A guidewire was employed. Patient was repositioned in multiple position. Ultimately, the tip of the NG tube could not be passed into the stomach due to the orientation of the orifice of the hiatal hernia. The gastric contents and administer contrast were aspirated easily back out of the hernia sac and stomach. IMPRESSION: NG tube position within the large hiatal hernia. Tube could not be passed into the proximal stomach due to the anatomy of the hernia. The tube will likely decompress the stomach in the current position. Findings conveyed topatient's floor nurse Tracyon 01/15/2019 at15:29. Electronically Signed   By: Genevive Bi M.D.   On: 01/15/2019 16:30   [place] Ekg Site Rite  Result Date: 01/14/2019 If Site Rite image not attached, placement could not be confirmed due to current cardiac rhythm.      Discharge Exam: Vitals:   02/10/19 2030 02/11/19 0608  BP: 116/77 113/62  Pulse: 98 83  Resp: 18   Temp: 98.5 F (36.9 C) 98.4 F (36.9 C)  SpO2: 99% 98%    General: Pt is alert, awake, not in acute distress Cardiovascular: RRR, S1/S2 +, no rubs, no gallops, + murmur Respiratory: CTA bilaterally, no wheezing, no rhonchi Abdominal: Soft, NT, ND, bowel sounds + Extremities: no edema, no cyanosis    The results of significant diagnostics from this hospitalization (including imaging, microbiology, ancillary and laboratory) are listed below for reference.     Microbiology: No results found for this or any previous visit (from the past 240 hour(s)).   Labs: BNP (last 3 results) No results for input(s): BNP in the last 8760 hours. Basic Metabolic  Panel: Recent Labs  Lab 02/06/19 0436 02/10/19 0427  NA 135 133*  K 4.2 4.3  CL 102 102  CO2 23 26  GLUCOSE 119* 121*  BUN 23 21  CREATININE 0.67 0.69  CALCIUM 8.5* 8.3*  MG 1.9 2.0  PHOS 3.6 3.2   Liver Function Tests: Recent Labs  Lab 02/06/19 0436 02/10/19 0427  AST 22 17  ALT 27 21  ALKPHOS 71 69  BILITOT 0.2* 0.2*  PROT 5.8* 5.7*  ALBUMIN 3.0* 2.9*   No results for input(s): LIPASE, AMYLASE in the last 168 hours. No results for input(s): AMMONIA in the last 168 hours. CBC: Recent Labs  Lab 02/06/19 0436 02/10/19 0427  WBC 6.0 6.1  NEUTROABS  --  2.7  HGB 9.5* 8.9*  HCT 30.5* 28.9*  MCV 82.9 82.3  PLT 248 242   Cardiac Enzymes: No results for input(s): CKTOTAL, CKMB, CKMBINDEX, TROPONINI in the last 168 hours. BNP: Invalid input(s): POCBNP CBG: Recent Labs  Lab 02/08/19 2213 02/09/19 0650 02/09/19 1428 02/09/19 2100 02/10/19 0529  GLUCAP 116* 100* 127* 145* 129*   D-Dimer No results for input(s): DDIMER in the  last 72 hours. Hgb A1c No results for input(s): HGBA1C in the last 72 hours. Lipid Profile Recent Labs    02/10/19 0427  TRIG 69   Thyroid function studies No results for input(s): TSH, T4TOTAL, T3FREE, THYROIDAB in the last 72 hours.  Invalid input(s): FREET3 Anemia work up No results for input(s): VITAMINB12, FOLATE, FERRITIN, TIBC, IRON, RETICCTPCT in the last 72 hours. Urinalysis    Component Value Date/Time   COLORURINE YELLOW 01/12/2019 0931   APPEARANCEUR HAZY (A) 01/12/2019 0931   LABSPEC 1.020 01/12/2019 0931   PHURINE 7.5 01/12/2019 0931   GLUCOSEU NEGATIVE 01/12/2019 0931   HGBUR TRACE (A) 01/12/2019 0931   BILIRUBINUR NEGATIVE 01/12/2019 0931   KETONESUR 15 (A) 01/12/2019 0931   PROTEINUR NEGATIVE 01/12/2019 0931   NITRITE NEGATIVE 01/12/2019 0931   LEUKOCYTESUR NEGATIVE 01/12/2019 0931   Sepsis Labs Invalid input(s): PROCALCITONIN,  WBC,  LACTICIDVEN Microbiology No results found for this or any previous  visit (from the past 240 hour(s)).   Patient was seen and examined on the day of discharge and was found to be in stable condition. Time coordinating discharge: 35 minutes including assessment and coordination of care, as well as examination of the patient.   SIGNED:  Noralee StainJennifer Wealthy Danielski, DO [facility] [email or web address] 02/11/2019, 9:28 AM

## 2019-02-11 NOTE — Progress Notes (Signed)
28 Days Post-Op    CC: Abdominal pain  Subjective: Patient is up in chair this morning eating breakfast - she is tolerating full liquids well.  Her midline incision is healing nicely  Objective: Vital signs in last 24 hours: Temp:  [98.3 F (36.8 C)-98.5 F (36.9 C)] 98.4 F (36.9 C) (07/07 16100608) Pulse Rate:  [83-98] 83 (07/07 0608) Resp:  [18] 18 (07/06 2030) BP: (102-116)/(62-77) 113/62 (07/07 0608) SpO2:  [98 %-99 %] 98 % (07/07 0608) Weight:  [65.3 kg] 65.3 kg (07/07 0608) Last BM Date: 02/10/19 460 PO 1036 IV Urine x 8 Stool x 1 Afebrile, VSS Labs OK Prealbumin 15.8  Intake/Output from previous day: 07/06 0701 - 07/07 0700 In: 1122.7 [P.O.:720; I.V.:402.7] Out: -  Intake/Output this shift: No intake/output data recorded.  General appearance: alert, cooperative and no distress Resp: clear to auscultation bilaterally GI: soft, non-tender; bowel sounds normal; no masses,  no organomegaly and incision clean/dry without erythema  Lab Results:  Recent Labs    02/10/19 0427  WBC 6.1  HGB 8.9*  HCT 28.9*  PLT 242    BMET Recent Labs    02/10/19 0427  NA 133*  K 4.3  CL 102  CO2 26  GLUCOSE 121*  BUN 21  CREATININE 0.69  CALCIUM 8.3*   PT/INR No results for input(s): LABPROT, INR in the last 72 hours.  Recent Labs  Lab 02/06/19 0436 02/10/19 0427  AST 22 17  ALT 27 21  ALKPHOS 71 69  BILITOT 0.2* 0.2*  PROT 5.8* 5.7*  ALBUMIN 3.0* 2.9*     Lipase     Component Value Date/Time   LIPASE 29 01/12/2019 0928     Medications: . calcium carbonate  1 tablet Oral TID WC  . docusate sodium  100 mg Oral BID  . feeding supplement  1 Container Oral BID BM  . feeding supplement (ENSURE ENLIVE)  237 mL Oral BID BM  . heparin  5,000 Units Subcutaneous Q8H  . latanoprost  1 drop Both Eyes QHS  . levothyroxine  50 mcg Oral Q0600  . loratadine  10 mg Oral Daily  . megestrol  800 mg Oral Daily  . metoCLOPramide  5 mg Oral TID AC  . multivitamin  with minerals  1 tablet Oral Daily  . pantoprazole  40 mg Oral BID  . polyethylene glycol  17 g Oral Daily  . sodium chloride flush  10-40 mL Intracatheter Q12H  . sodium chloride flush  3 mL Intravenous Q12H   . sodium chloride    . lactated ringers 10 mL/hr at 02/10/19 1807    Assessment/Plan Thyroid disease HTN HLD DM Hiatal hernia - TUMS, Protonix Protein Calorie Malnutrition- Pre-albumin16.4(6/29) >> 15.8(7/6) Anemia H/H 8.9/28.9(7/6)  SBO/ileus - S/p Ex Lap with LOA for SBO due to Omental Adhesion,internal hernia - Dr. Derrell Lollingamirez - 01/14/2019 - POD #28 - Continue to mobilize for bowel function - cont Full liquid, continue boost -dietician following as well,continue reducedTPN rate to try to stimulate more PO intake, megace/reglan as well - continue calorie count - She has a moderate hiatal hernia, in speaking with her she eats very small meals at home bc if she tries to eat more, she will have issues, I think her recent episodes of n/v are probably more due to her hiatal hernia than what is possibly going on in her LLQ (but that is probably contributing to issues). She does have some mild SB dilation on CT. However she is having flatus/BMs.  I predict she may need to stay on full liquid diet for awhile. Would like to see if she meets protein intake on FLD over the next few days and can make decision about TPN at that time.   Will d/w nursing team about cont calorie counts  FEN - TPN,  Full liquid diet Bowel Regimen (Miralax, Colace) VTE - Heparin, SCDs, Mobilize  ID - Cefotetan periop. None currently. Afebrile. Foley - None  Follow up- Dr. Rosendo Gros  POC - Son - Tyrone Schimke [phone]) -Patient's son and daughter plan to be with her when she returns home. Daughter during the day and son at night.  Plan: Full liquids including protein shakes. If meeting nutrition needs, ok for discharge from our perspective.       LOS: 30 days    Sharon Mt  [facility] 02/11/2019 [phone]

## 2019-10-03 IMAGING — CT CT ABDOMEN AND PELVIS WITH CONTRAST
2 of 5 series · 16 of 46 positions shown, 18 images · IV contrast (APPLIED)
Comparison: CT dated January 12, 2019.

CLINICAL DATA: Nausea and vomiting

EXAM:
CT ABDOMEN AND PELVIS WITH CONTRAST
TECHNIQUE: Multidetector CT imaging of the abdomen and pelvis was performed
using the standard protocol following bolus administration of
intravenous contrast.
CONTRAST:  100mL OMNIPAQUE IOHEXOL 300 MG/ML  SOLN

[Series 2: axial (person_name) (person_name) · axial · 0.82mm/px · z∈[-444,-79]mm · 13 of 83 slices shown, 15 images]
[im 5/83  soft-tissue]
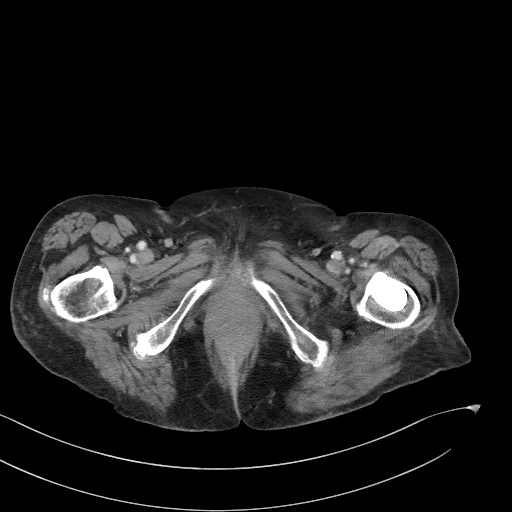
[im 5/83  bone]
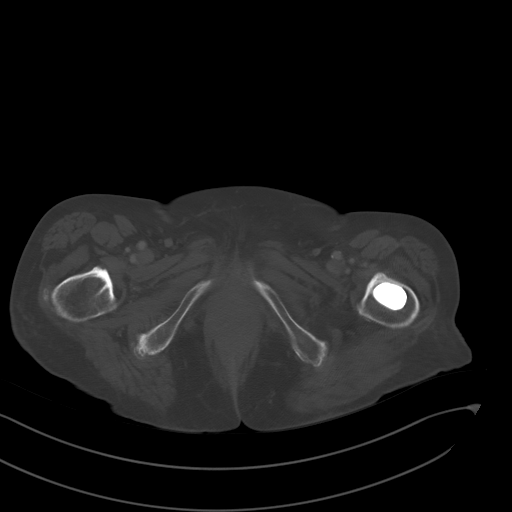
[im 10/83  soft-tissue]
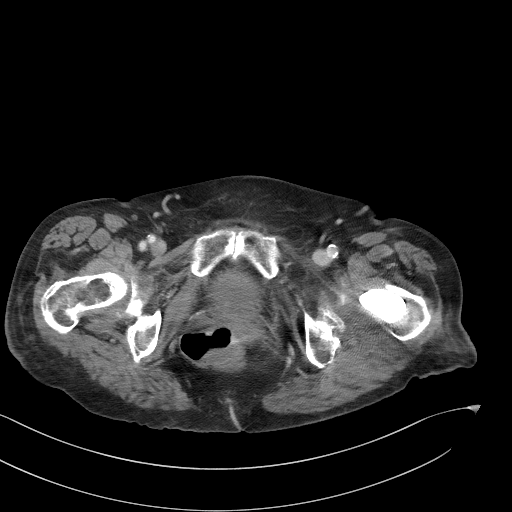
[im 20/83  soft-tissue]
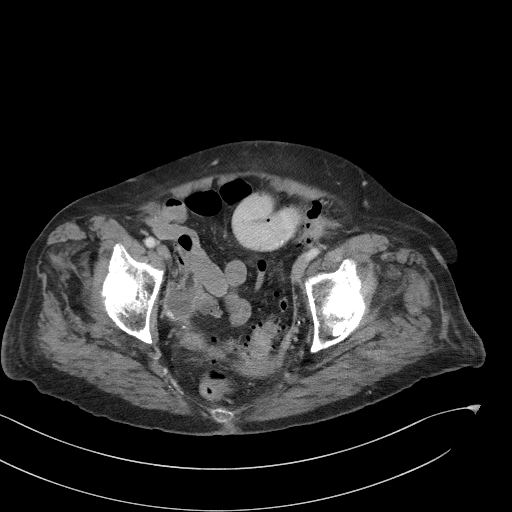
[im 25/83  soft-tissue]
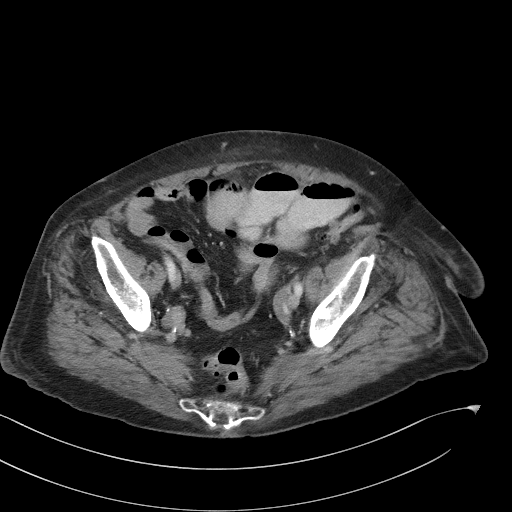
[im 29/83  soft-tissue]
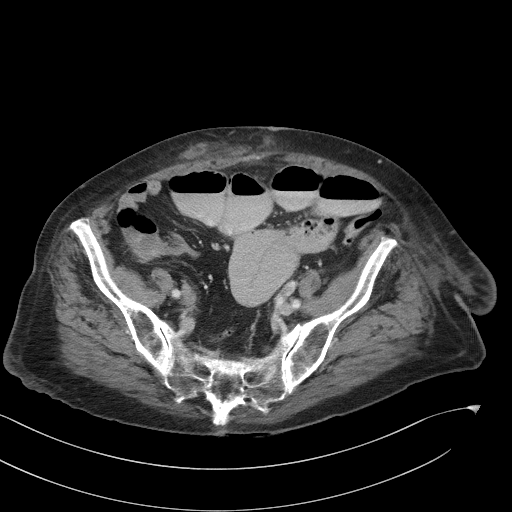
[im 34/83  soft-tissue]
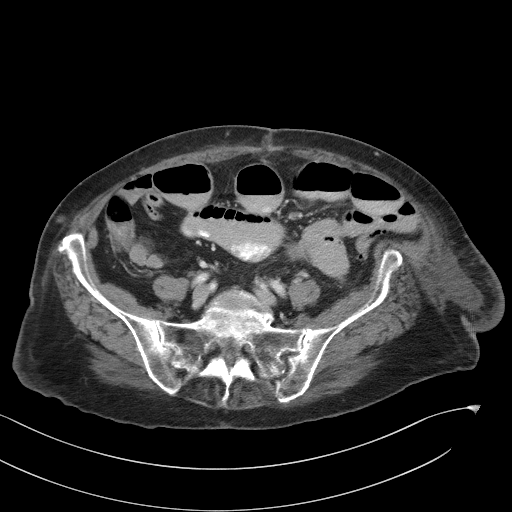
[im 44/83  soft-tissue]
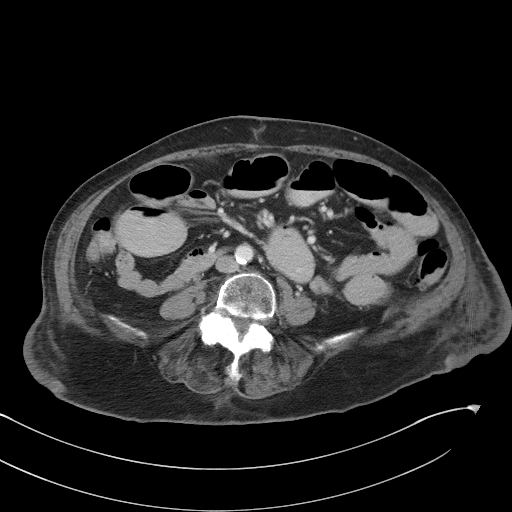
[im 49/83  soft-tissue]
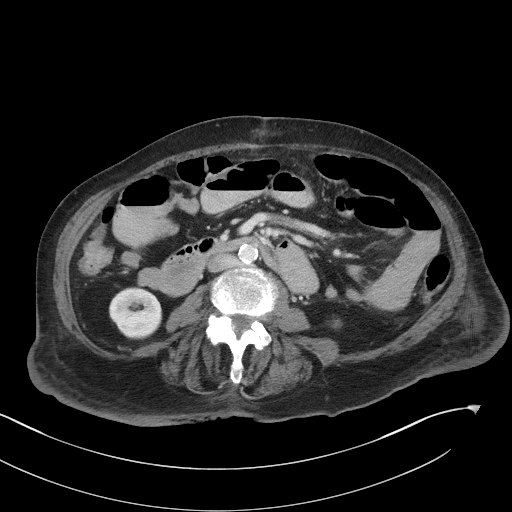
[im 54/83  soft-tissue]
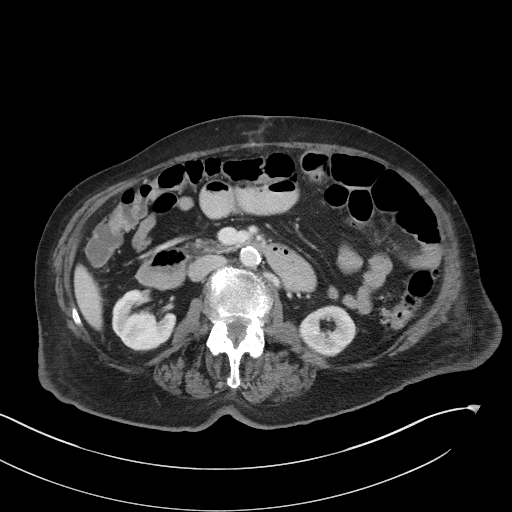
[im 54/83  bone]
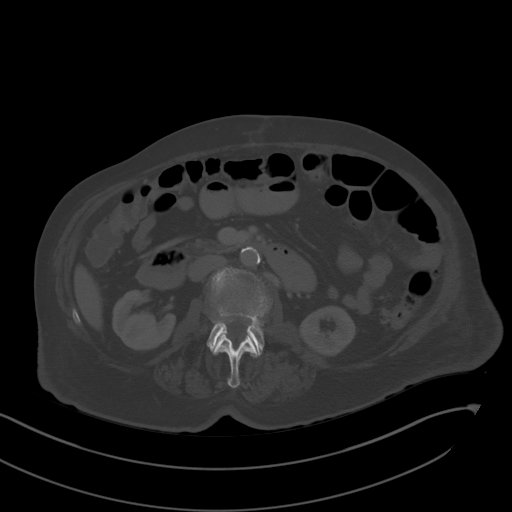
[im 58/83  soft-tissue]
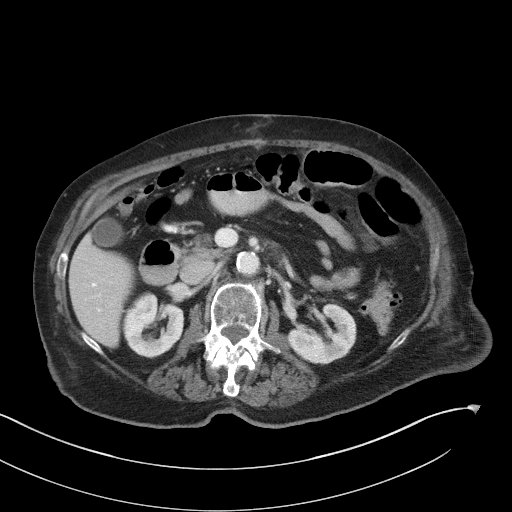
[im 63/83  soft-tissue]
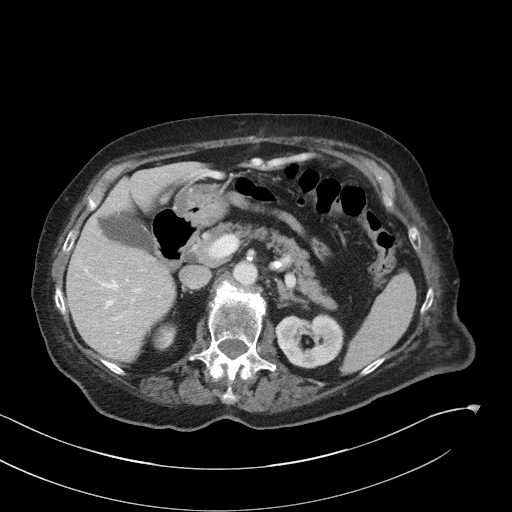
[im 73/83  soft-tissue]
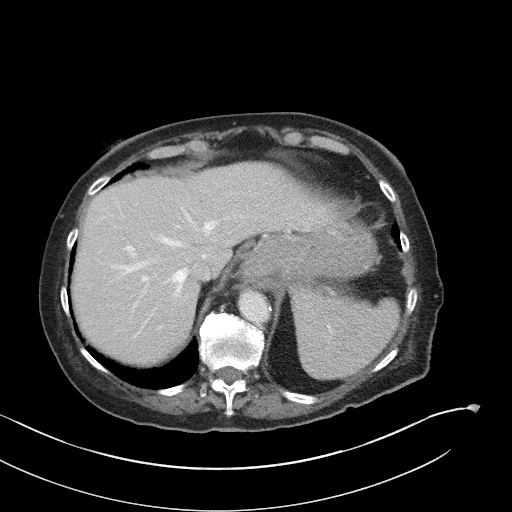
[im 78/83  soft-tissue]
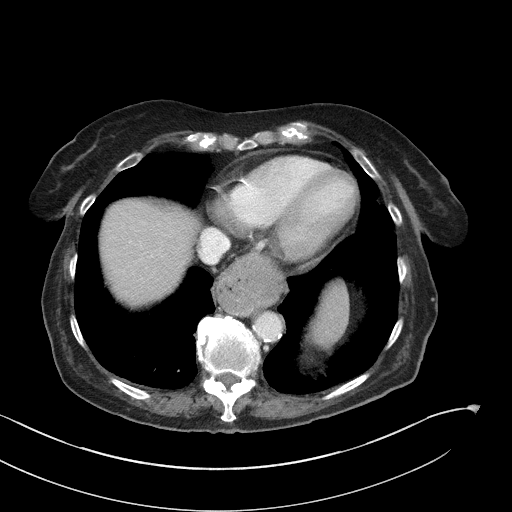

[Series 5: coronal st · coronal · 0.77mm/px · 3 of 86 slices shown]
[im 29/86  soft-tissue]
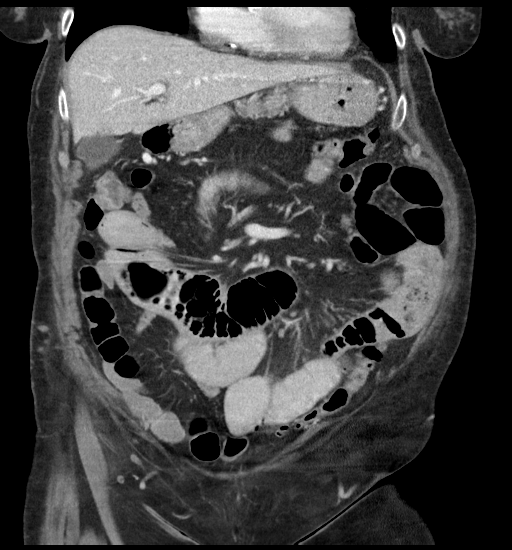
[im 38/86  soft-tissue]
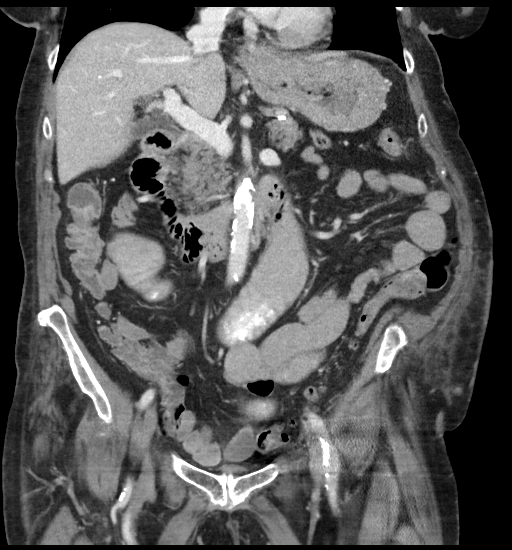
[im 48/86  soft-tissue]
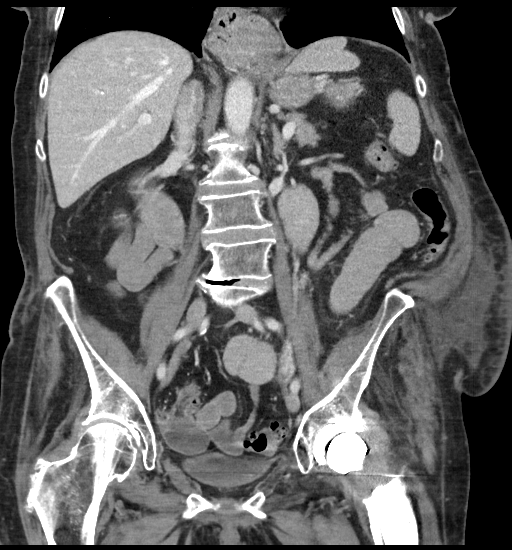

[16 of 46 positions shown; findings below may reference images not displayed]

FINDINGS: Lower chest: The lung bases are clear. The heart size is normal.

Hepatobiliary: There is a stable hypoattenuating area in hepatic
segment 6, likely representing a benign process such as a cyst or
hemangioma. Normal gallbladder.There is stable extrahepatic biliary
ductal dilatation.

Pancreas: Normal contours without ductal dilatation. No
peripancreatic fluid collection.

Spleen: No splenic laceration or hematoma.

Adrenals/Urinary Tract:

--Adrenal glands: No adrenal hemorrhage.

--Right kidney/ureter: No hydronephrosis or perinephric hematoma.

--Left kidney/ureter: No hydronephrosis or perinephric hematoma.

--Urinary bladder: Unremarkable.

Stomach/Bowel:

--Stomach/Duodenum: There is a large hiatal hernia, similar to prior
study. There is some mild gastric wall thickening of unknown
clinical significance.

--Small bowel: There are few dilated loops of small bowel measuring
up to approximately 3.5-4 cm. There are air-fluid levels involving
these small bowel loops. There appears to be a transition point in
the left lower quadrant.

--Colon: Rectosigmoid diverticulosis without acute inflammation.

--Appendix: Normal.

Vascular/Lymphatic: Atherosclerotic calcification is present within
the non-aneurysmal abdominal aorta, without hemodynamically
significant stenosis. There is a retroaortic left renal vein, a
normal variant.

--No retroperitoneal lymphadenopathy.

--No mesenteric lymphadenopathy.

--No pelvic or inguinal lymphadenopathy.

Reproductive: Unremarkable

Other: No ascites or free air. The abdominal wall is normal.

Musculoskeletal. Multilevel degenerative disc disease and facet
arthrosis. No bony spinal canal stenosis. Patient is status post
total hip arthroplasty on the left without evidence of a
periprosthetic fracture involving visualized portions of the left
femur.
IMPRESSION: 1. Again noted are dilated loops of small bowel with a transition
point in the left lower quadrant. Findings are consistent with a low
to moderate grade small bowel obstruction.
2. Large hiatal hernia.
3. Apparent wall thickening of the stomach which is similar to prior
study and is of unknown clinical significance. This may be secondary
to overall underdistention.
4. Sigmoid diverticulosis without CT evidence of diverticulitis.

## 2024-07-07 DEATH — deceased
